# Patient Record
Sex: Female | Born: 1935 | Race: White | Hispanic: No | Marital: Married | Smoking: Never smoker
Health system: Southern US, Community
[De-identification: ages and names within clinical notes are randomized; demographics above are authoritative.]

## PROBLEM LIST (undated history)

## (undated) DIAGNOSIS — R05 Cough: Secondary | ICD-10-CM

## (undated) DIAGNOSIS — G510 Bell's palsy: Secondary | ICD-10-CM

## (undated) DIAGNOSIS — M1990 Unspecified osteoarthritis, unspecified site: Secondary | ICD-10-CM

## (undated) DIAGNOSIS — E039 Hypothyroidism, unspecified: Secondary | ICD-10-CM

## (undated) DIAGNOSIS — G629 Polyneuropathy, unspecified: Secondary | ICD-10-CM

## (undated) DIAGNOSIS — J8410 Pulmonary fibrosis, unspecified: Secondary | ICD-10-CM

## (undated) DIAGNOSIS — R0902 Hypoxemia: Secondary | ICD-10-CM

## (undated) DIAGNOSIS — R059 Cough, unspecified: Secondary | ICD-10-CM

## (undated) DIAGNOSIS — K589 Irritable bowel syndrome without diarrhea: Secondary | ICD-10-CM

## (undated) DIAGNOSIS — IMO0001 Reserved for inherently not codable concepts without codable children: Secondary | ICD-10-CM

## (undated) DIAGNOSIS — K219 Gastro-esophageal reflux disease without esophagitis: Secondary | ICD-10-CM

## (undated) HISTORY — PX: EYE SURGERY: SHX253

## (undated) HISTORY — PX: SHOULDER ARTHROSCOPY W/ ROTATOR CUFF REPAIR: SHX2400

## (undated) HISTORY — PX: OTHER SURGICAL HISTORY: SHX169

## (undated) HISTORY — PX: APPENDECTOMY: SHX54

---

## 2004-11-18 ENCOUNTER — Ambulatory Visit: Payer: Self-pay | Admitting: Internal Medicine

## 2004-12-04 ENCOUNTER — Ambulatory Visit: Payer: Self-pay | Admitting: Internal Medicine

## 2005-01-24 ENCOUNTER — Ambulatory Visit: Payer: Self-pay | Admitting: Internal Medicine

## 2006-01-15 ENCOUNTER — Ambulatory Visit: Payer: Self-pay | Admitting: Internal Medicine

## 2006-01-29 ENCOUNTER — Ambulatory Visit: Payer: Self-pay | Admitting: Unknown Physician Specialty

## 2006-09-25 ENCOUNTER — Ambulatory Visit: Payer: Self-pay | Admitting: Internal Medicine

## 2007-02-04 ENCOUNTER — Ambulatory Visit: Payer: Self-pay | Admitting: Internal Medicine

## 2007-02-25 ENCOUNTER — Ambulatory Visit: Payer: Self-pay | Admitting: Internal Medicine

## 2008-02-29 ENCOUNTER — Ambulatory Visit: Payer: Self-pay | Admitting: Internal Medicine

## 2008-03-03 ENCOUNTER — Ambulatory Visit: Payer: Self-pay | Admitting: Otolaryngology

## 2009-01-07 ENCOUNTER — Emergency Department: Payer: Self-pay | Admitting: Unknown Physician Specialty

## 2009-02-21 ENCOUNTER — Ambulatory Visit: Payer: Self-pay | Admitting: Internal Medicine

## 2009-03-13 ENCOUNTER — Ambulatory Visit: Payer: Self-pay | Admitting: Internal Medicine

## 2009-06-27 ENCOUNTER — Ambulatory Visit: Payer: Self-pay | Admitting: Ophthalmology

## 2009-07-10 ENCOUNTER — Ambulatory Visit: Payer: Self-pay | Admitting: Ophthalmology

## 2009-09-05 ENCOUNTER — Ambulatory Visit: Payer: Self-pay | Admitting: Gastroenterology

## 2010-02-07 ENCOUNTER — Ambulatory Visit: Payer: Self-pay | Admitting: Gastroenterology

## 2010-02-11 LAB — PATHOLOGY REPORT

## 2010-02-25 ENCOUNTER — Ambulatory Visit: Payer: Self-pay | Admitting: Internal Medicine

## 2010-03-21 ENCOUNTER — Ambulatory Visit: Payer: Self-pay | Admitting: Internal Medicine

## 2010-04-03 ENCOUNTER — Ambulatory Visit: Payer: Self-pay | Admitting: Internal Medicine

## 2011-03-04 ENCOUNTER — Ambulatory Visit: Payer: Self-pay | Admitting: Gastroenterology

## 2011-03-24 ENCOUNTER — Ambulatory Visit: Payer: Self-pay | Admitting: Internal Medicine

## 2011-12-09 ENCOUNTER — Ambulatory Visit: Payer: Self-pay | Admitting: Internal Medicine

## 2011-12-09 LAB — CBC WITH DIFFERENTIAL/PLATELET
Basophil %: 0.7 %
Eosinophil #: 0.1 10*3/uL (ref 0.0–0.7)
Eosinophil %: 1.6 %
HCT: 40 % (ref 35.0–47.0)
HGB: 12.9 g/dL (ref 12.0–16.0)
Lymphocyte #: 2.1 10*3/uL (ref 1.0–3.6)
Lymphocyte %: 33.1 %
MCV: 99 fL (ref 80–100)
Monocyte %: 9.3 %
Neutrophil #: 3.5 10*3/uL (ref 1.4–6.5)
Neutrophil %: 55.3 %
RBC: 4.04 10*6/uL (ref 3.80–5.20)
RDW: 13.6 % (ref 11.5–14.5)

## 2011-12-09 LAB — IRON AND TIBC
Iron Bind.Cap.(Total): 226 ug/dL — ABNORMAL LOW (ref 250–450)
Iron Saturation: 44 %
Iron: 98 ug/dL (ref 50–170)
Unbound Iron-Bind.Cap.: 126 ug/dL

## 2011-12-09 LAB — COMPREHENSIVE METABOLIC PANEL
Albumin: 3.5 g/dL (ref 3.4–5.0)
BUN: 16 mg/dL (ref 7–18)
Bilirubin,Total: 0.3 mg/dL (ref 0.2–1.0)
Calcium, Total: 8.4 mg/dL — ABNORMAL LOW (ref 8.5–10.1)
Chloride: 105 mmol/L (ref 98–107)
Co2: 28 mmol/L (ref 21–32)
Creatinine: 0.56 mg/dL — ABNORMAL LOW (ref 0.60–1.30)
EGFR (African American): >60
EGFR (Non-African Amer.): >60
Osmolality: 278 (ref 275–301)
SGPT (ALT): 27 U/L
Sodium: 139 mmol/L (ref 136–145)
Total Protein: 7.3 g/dL (ref 6.4–8.2)

## 2011-12-09 LAB — TSH: Thyroid Stimulating Horm: 2.73 u[IU]/mL

## 2011-12-31 ENCOUNTER — Ambulatory Visit: Payer: Self-pay | Admitting: Internal Medicine

## 2012-03-25 ENCOUNTER — Ambulatory Visit: Payer: Self-pay | Admitting: Internal Medicine

## 2012-10-28 ENCOUNTER — Ambulatory Visit: Payer: Self-pay | Admitting: Gastroenterology

## 2012-10-29 LAB — PATHOLOGY REPORT

## 2013-02-07 ENCOUNTER — Ambulatory Visit: Payer: Self-pay | Admitting: Gastroenterology

## 2013-02-08 LAB — PATHOLOGY REPORT

## 2013-03-27 IMAGING — CT CT CHEST W/ CM
1 series · 15 of 31 positions shown, 19 images · non-contrast
Comparison: none

REASON FOR EXAM: interstitial lung disease
COMMENTS:

[Series 2: chest w/ 3.0 i41f 3 · axial · 0.60mm/px · z∈[-622,-364]mm · 15 of 94 slices shown, 19 images]
[im 4/94  mediastinal]
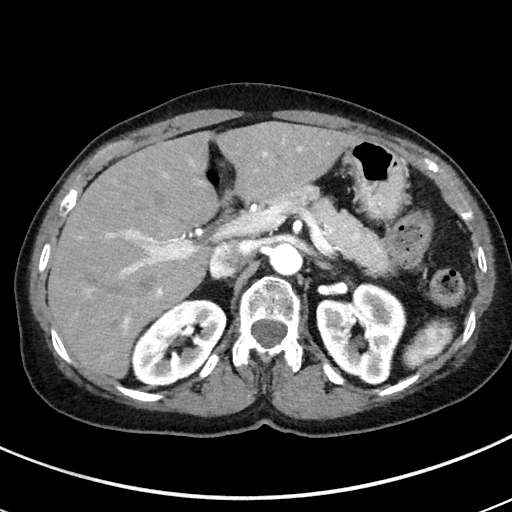
[im 4/94  lung]
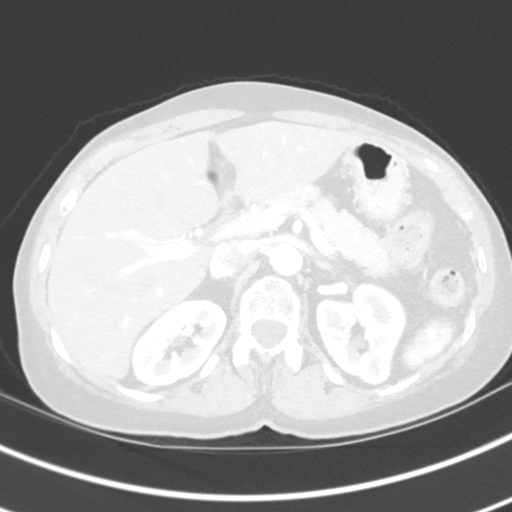
[im 11/94  lung]
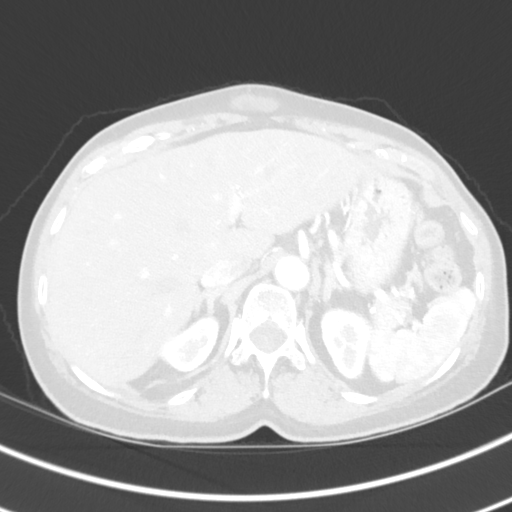
[im 18/94  lung]
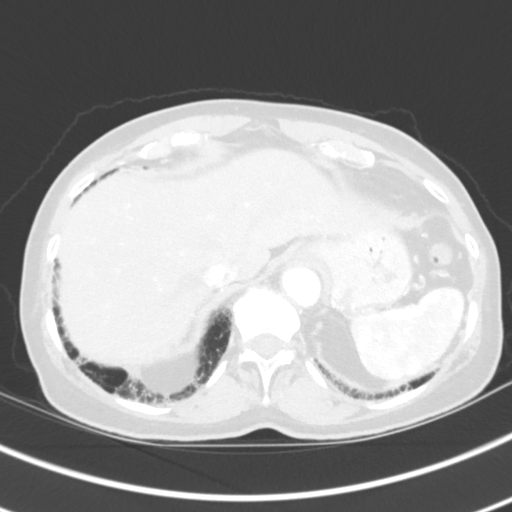
[im 21/94  lung]
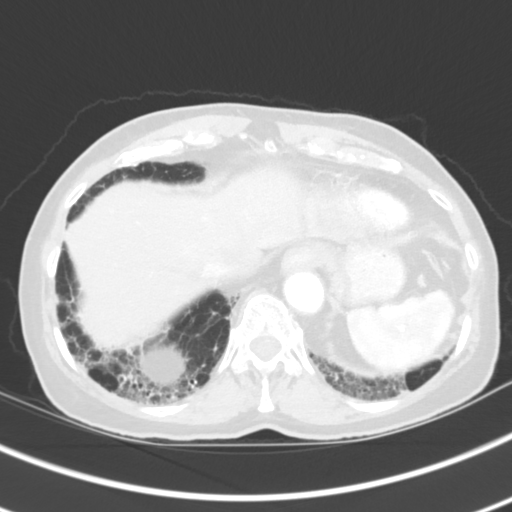
[im 28/94  mediastinal]
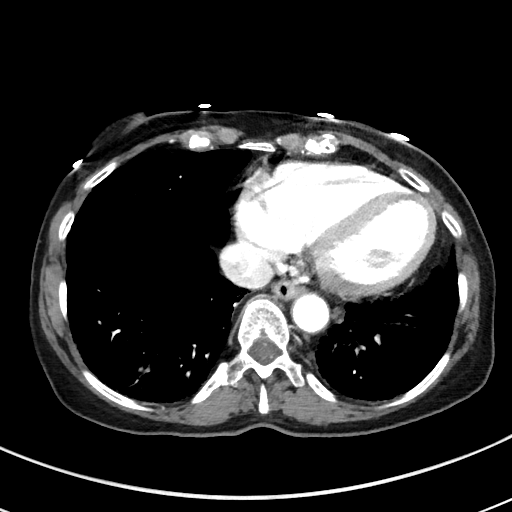
[im 28/94  lung]
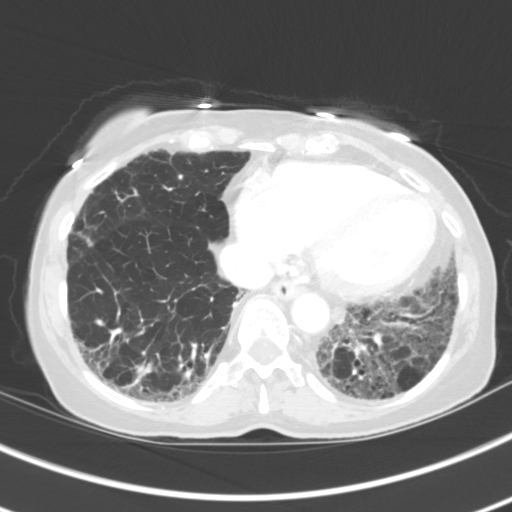
[im 35/94  lung]
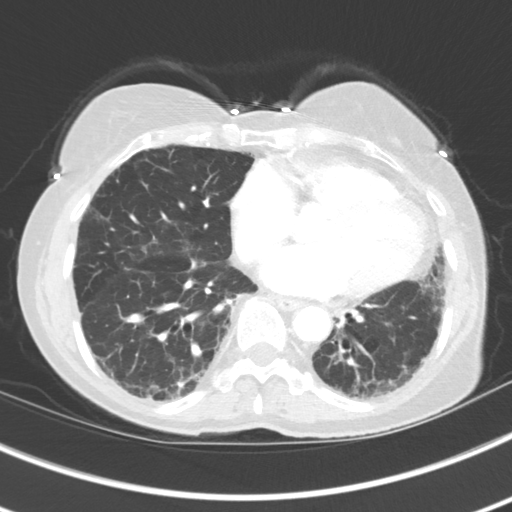
[im 42/94  lung]
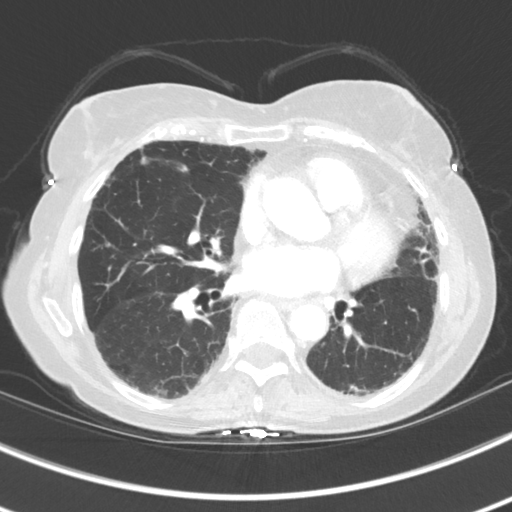
[im 49/94  lung]
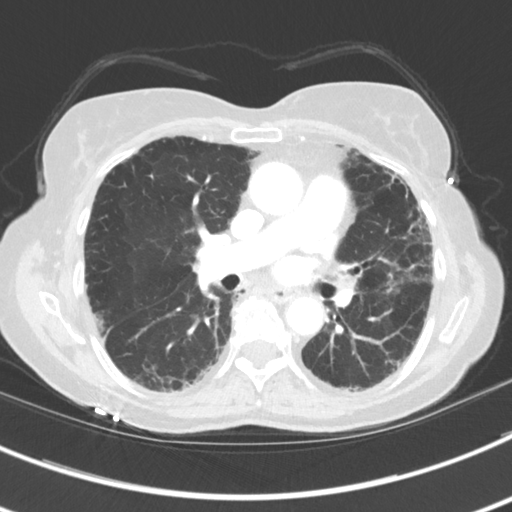
[im 52/94  mediastinal]
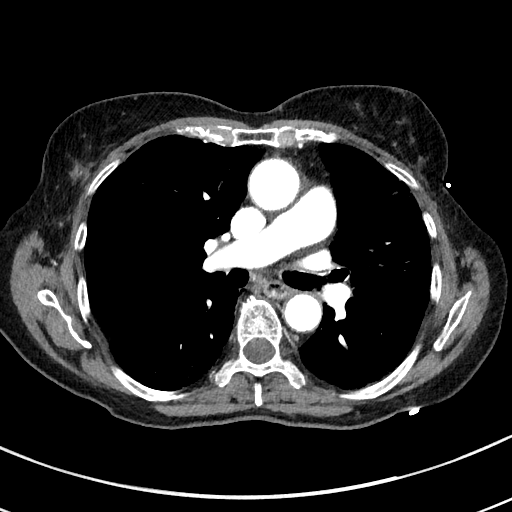
[im 52/94  lung]
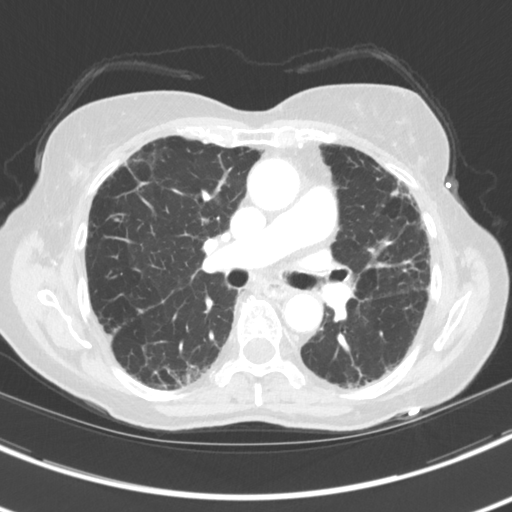
[im 59/94  lung]
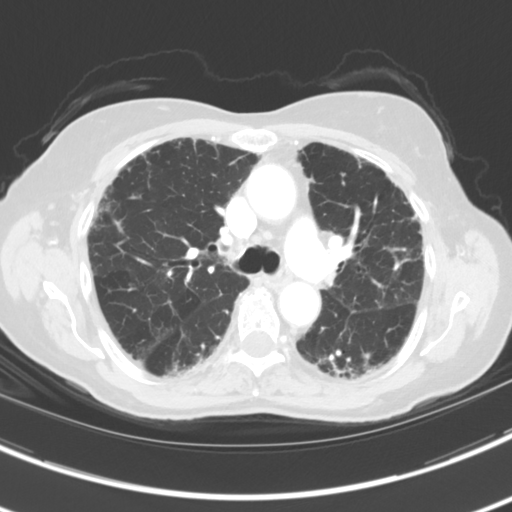
[im 66/94  lung]
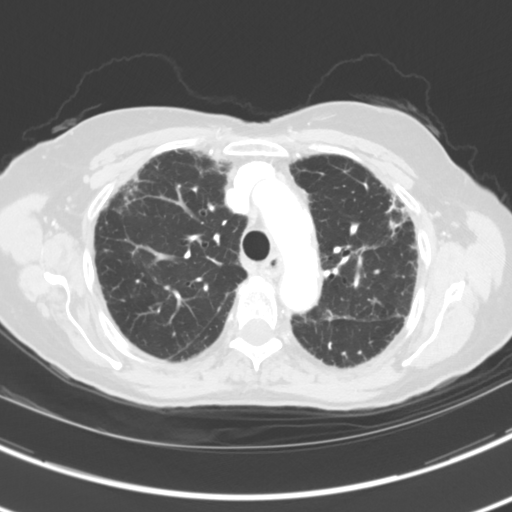
[im 73/94  lung]
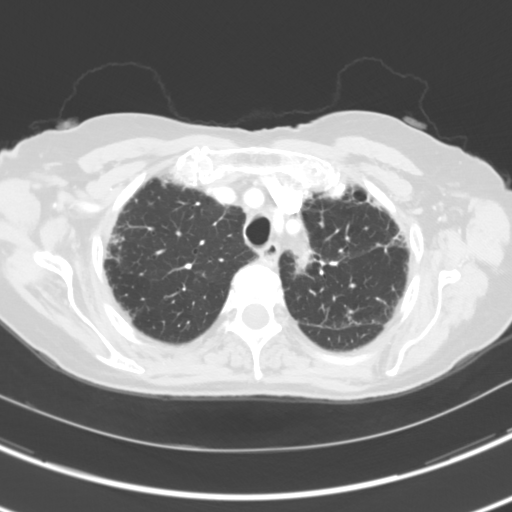
[im 76/94  mediastinal]
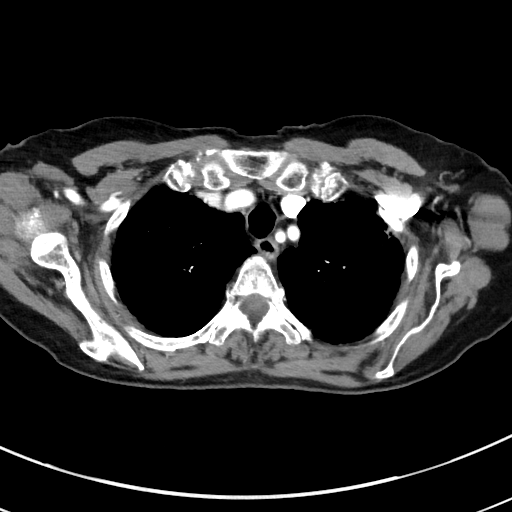
[im 76/94  lung]
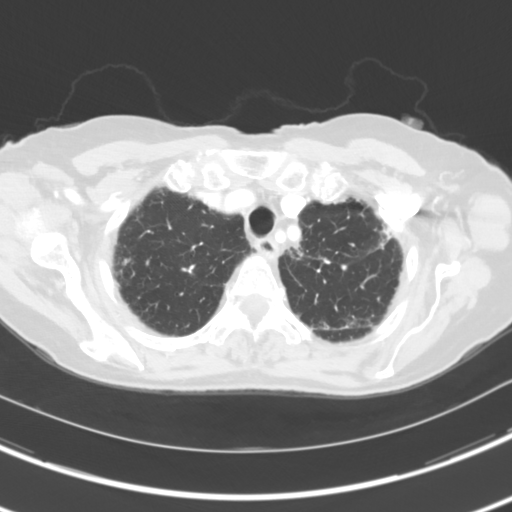
[im 83/94  lung]
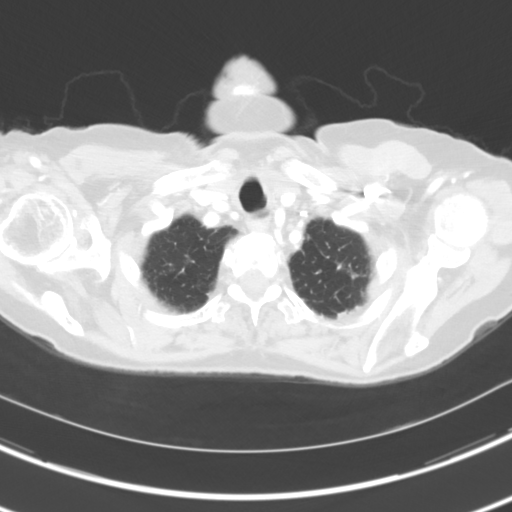
[im 90/94  lung]
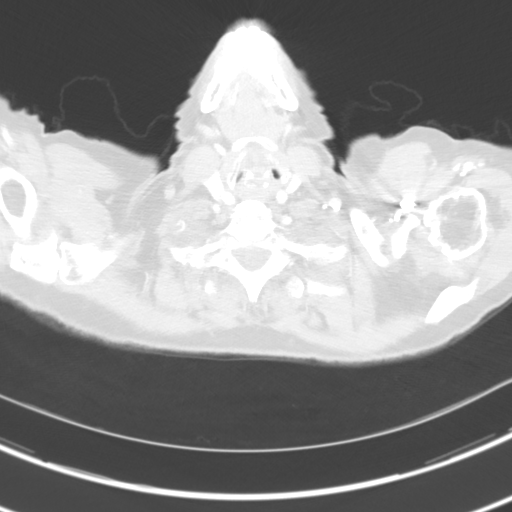

[15 of 31 positions shown; findings below may reference images not displayed]

PROCEDURE:     KCT - KCT CHEST WITH CONTRAST  - December 31, 2011 [DATE]

RESULT:     Axial CT scanning was performed through the chest with
reconstructions at 3 mm intervals and slice thicknesses following
intravenous administration of 75 cc of Isovue 370. Review of multiplanar
reconstructed images was performed separately on the VIA monitor.

There are fairly extensive fibrotic changes in both lungs. These have
increased in conspicuity since February 2010. Alveolar infiltrates are not
demonstrated. No suspicious parenchymal masses are demonstrated. There is
bibasilar bronchiectasis.

At mediastinal window settings the cardiac chambers are top normal in size.
The caliber of the thoracic aorta is normal. The thoracic esophagus does not
appear abnormally distended. Contrast within the pulmonary arterial tree is
normal where visualized. No bulky mediastinal or hilar lymph nodes are
evident. The thoracic vertebral bodies are preserved in height. The thyroid
lobes are not abnormally enlarged.

Within the upper abdomen the observed portions of the liver mean and adrenal
glands are normal in appearance.
IMPRESSION: 1. There are progressive interstitial fibrotic changes in both lungs. There
is bibasilar bronchiectasis. No suspicious mass or classic alveolar
pneumonia is seen.
2. There is no mediastinal or hilar lymphadenopathy. There is no evidence of
CHF. No pleural nor pericardial effusion is demonstrated.

## 2013-04-12 ENCOUNTER — Ambulatory Visit: Payer: Self-pay | Admitting: Physician Assistant

## 2013-04-26 ENCOUNTER — Ambulatory Visit: Payer: Self-pay | Admitting: Physician Assistant

## 2013-06-06 ENCOUNTER — Ambulatory Visit: Payer: Self-pay

## 2013-11-09 ENCOUNTER — Ambulatory Visit: Payer: Self-pay | Admitting: Physician Assistant

## 2014-04-04 ENCOUNTER — Ambulatory Visit (INDEPENDENT_AMBULATORY_CARE_PROVIDER_SITE_OTHER): Payer: Medicare Other

## 2014-04-04 ENCOUNTER — Encounter: Payer: Self-pay | Admitting: Podiatry

## 2014-04-04 ENCOUNTER — Ambulatory Visit (INDEPENDENT_AMBULATORY_CARE_PROVIDER_SITE_OTHER): Payer: Medicare Other | Admitting: Podiatry

## 2014-04-04 VITALS — BP 143/73 | HR 63 | Resp 16 | Ht 59.0 in | Wt 113.0 lb

## 2014-04-04 DIAGNOSIS — M2041 Other hammer toe(s) (acquired), right foot: Secondary | ICD-10-CM

## 2014-04-04 DIAGNOSIS — M2010 Hallux valgus (acquired), unspecified foot: Secondary | ICD-10-CM

## 2014-04-04 NOTE — Patient Instructions (Signed)
Bunion You have a bunion deformity of the feet. This is more common in women. It tends to be an inherited problem. Symptoms can include pain, swelling, and deformity around the great toe. Numbness and tingling may also be present. Your symptoms are often worsened by wearing shoes that cause pressure on the bunion. Changing the type of shoes you wear helps reduce symptoms. A wide shoe decreases pressure on the bunion. An arch support may be used if you have flat feet. Avoid shoes with heels higher than two inches. This puts more pressure on the bunion. X-rays may be helpful in evaluating the severity of the problem. Other foot problems often seen with bunions include corns, calluses, and hammer toes. If the deformity or pain is severe, surgical treatment may be necessary. Keep off your painful foot as much as possible until the pain is relieved. Call your caregiver if your symptoms are worse.  SEEK IMMEDIATE MEDICAL CARE IF:  You have increased redness, pain, swelling, or other symptoms of infection. Document Released: 05/05/2005 Document Revised: 07/28/2011 Document Reviewed: 11/02/2006 [facility] Patient Information 2015 [place], [place]. This information is not intended to replace advice given to you by your health care provider. Make sure you discuss any questions you have with your health care provider. Hammer Toes Hammer toes is a condition in which one or more of your toes is permanently flexed. CAUSES  This happens when a muscle imbalance or abnormal bone length makes your small toes buckle. This causes the toe joint to contract and the strong cord-like bands that attach muscles to the bones (tendons) in your toes to shorten.  SIGNS AND SYMPTOMS  Common symptoms of flexible hammer toes include:   A buildup of skin cells (corns). Corns occur where boney bumps come in frequent contact with hard surfaces. For example, where your shoes press and rub.  Irritation.  Inflammation.  Pain.  Limited  motion in your toes. DIAGNOSIS  Hammer toes are diagnosed through a physical exam of your toes. During the exam, your health care provider may try to reproduce your symptoms by manipulating your foot. Often, X-ray exams are done to determine the degree of deformity and to make sure that the cause is not a fracture.  TREATMENT  Hammer toes can be treated with corrective surgery. There are several types of surgical procedures that can treat hammer toes. The most common procedures include:  Arthroplasty--A portion of the joint is surgically removed and your toe is straightened. The gap fills in with fibrous tissue. This procedure helps treat pain and deformity and helps restore function.  Fusion--Cartilage between the two bones of the affected joint is taken out and the bones fuse together into one longer bone. This helps keep your toe stable and reduces pain but leaves your toe stiff, yet straight.  Implantation--A portion of your bone is removed and replaced with an implant to restore motion.  Flexor tendon transfers--This procedure repositions the tendons that curl the toes down (flexor tendons). This may be done to release the deforming force that causes your toe to buckle. Several of these procedures require fixing your toe with a pin that is visible at the tip of your toe. The pin keeps the toe straight during healing. Your health care provider will remove the pin usually within 4-8 weeks after the procedure.  Document Released: 05/02/2000 Document Revised: 05/10/2013 Document Reviewed: 01/10/2013 [facility] Patient Information 2015 [place], [place]. This information is not intended to replace advice given to you by your health care  provider. Make sure you discuss any questions you have with your health care provider.

## 2014-04-05 NOTE — Progress Notes (Signed)
Patient ID: Sherri KluverMargot S Price, female   DOB: Oct 24, 1933, 80 y.o.   MRN: [id number]  Subjective: 80 year old female presents the office today for complaints of a painful lesion on the bottom of her right foot. She states that she puts a lot of pressure in this area and she has a callus which is painful particularly with weightbearing and ambulation insert and shoe gear. She previously has seen Dr. Al CorpusHyatt discussed various surgical options for which she is considering. She denies any acute changes since last appointment. No other complaints at this time. Denies any systemic complaints such as fevers, chills, nausea, vomiting.  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 There is a prominent second metatarsal head plantarly with hyperkeratotic tissue in this area plantarly. There is tenderness to palpation overlying the second MTPJ, particularly over the area of hyperkeratotic lesion plantarly. Mild HAV and hammertoe contractures and left foot however asymptomatic at this time. MMT 5/5, ROM WNL No calf pain with compression, swelling, warmth, erythema.   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  Plan: -X-rays were obtained and reviewed with the patient today -Conservative versus surgical options discussed including alternatives, risks, complications. -Hyperkeratotic lesion submetatarsal 2 on the right foot sharply debrided without complications. Dispensed offloading pads. -Patient was inquiring about various surgical options. Discussed with her options including possible [place]  bunionectomy with second metatarsal osteotomy and hammertoe repair versus possible second digit amputation due to the dislocation/contracture of the toe. At this time the patient will consider her options. -Follow-up as needed. In the meantime, call the office with any questions, concerns, change in symptoms.

## 2014-04-18 ENCOUNTER — Ambulatory Visit: Payer: Self-pay | Admitting: Physician Assistant

## 2014-04-24 ENCOUNTER — Ambulatory Visit: Payer: Self-pay | Admitting: Podiatry

## 2014-08-29 ENCOUNTER — Ambulatory Visit
Admit: 2014-08-29 | Disposition: A | Discharge status: Home / Self Care | Payer: Self-pay | Attending: Internal Medicine | Admitting: Internal Medicine

## 2014-09-21 ENCOUNTER — Encounter: Payer: Self-pay | Admitting: *Deleted

## 2014-09-22 ENCOUNTER — Encounter: Payer: Self-pay | Admitting: Anesthesiology

## 2014-09-22 ENCOUNTER — Ambulatory Visit: Payer: Medicare Other | Admitting: Anesthesiology

## 2014-09-22 ENCOUNTER — Ambulatory Visit
Admission: RE | Admit: 2014-09-22 | Discharge: 2014-09-22 | Disposition: A | Discharge status: Home / Self Care | Payer: Medicare Other | Source: Ambulatory Visit | Attending: Gastroenterology | Admitting: Gastroenterology

## 2014-09-22 ENCOUNTER — Encounter
Admission: RE | Disposition: A | Discharge status: Home / Self Care | Payer: Self-pay | Source: Ambulatory Visit | Attending: Gastroenterology

## 2014-09-22 DIAGNOSIS — Z79899 Other long term (current) drug therapy: Secondary | ICD-10-CM | POA: Diagnosis not present

## 2014-09-22 DIAGNOSIS — R0789 Other chest pain: Secondary | ICD-10-CM | POA: Diagnosis present

## 2014-09-22 DIAGNOSIS — K210 Gastro-esophageal reflux disease with esophagitis: Secondary | ICD-10-CM | POA: Diagnosis not present

## 2014-09-22 DIAGNOSIS — K219 Gastro-esophageal reflux disease without esophagitis: Secondary | ICD-10-CM

## 2014-09-22 HISTORY — DX: Gastro-esophageal reflux disease without esophagitis: K21.9

## 2014-09-22 HISTORY — PX: ESOPHAGOGASTRODUODENOSCOPY: SHX5428

## 2014-09-22 SURGERY — EGD (ESOPHAGOGASTRODUODENOSCOPY)
Anesthesia: Monitor Anesthesia Care

## 2014-09-22 MED ORDER — SODIUM CHLORIDE 0.9 % IV SOLN: INTRAVENOUS | Status: DC

## 2014-09-22 MED ORDER — SODIUM CHLORIDE 0.9 % IV SOLN
INTRAVENOUS | Status: DC
  Administered 2014-09-22: 09:00:00 via INTRAVENOUS
  Administered 2014-09-22: 1000 mL via INTRAVENOUS

## 2014-09-22 MED ORDER — PROPOFOL INFUSION 10 MG/ML OPTIME
INTRAVENOUS | Status: DC | PRN
  Administered 2014-09-22: 150 ug/kg/min via INTRAVENOUS

## 2014-09-22 MED ORDER — LIDOCAINE HCL (CARDIAC) 20 MG/ML IV SOLN
INTRAVENOUS | Status: DC | PRN
  Administered 2014-09-22: 50 mg via INTRAVENOUS

## 2014-09-22 MED ORDER — GLYCOPYRROLATE 0.2 MG/ML IJ SOLN
INTRAMUSCULAR | Status: DC | PRN
  Administered 2014-09-22: 0.2 mg via INTRAVENOUS

## 2014-09-22 MED ORDER — PROPOFOL 10 MG/ML IV BOLUS
INTRAVENOUS | Status: DC | PRN
  Administered 2014-09-22: 75 ug via INTRAVENOUS

## 2014-09-22 NOTE — Transfer of Care (Signed)
Immediate Anesthesia Transfer of Care Note  Patient: Sherri Price  Procedure(s) Performed: Procedure(s): ESOPHAGOGASTRODUODENOSCOPY (EGD) (N/A)  Patient Location: PACU/Endo  Anesthesia Type:MAC  Level of Consciousness: Alert, Awake, Oriented  Airway & Oxygen Therapy: Patient Spontanous Breathing  Post-op Assessment: Report given to RN  Post vital signs: Reviewed and stable  Last Vitals:  Filed Vitals:   09/22/14 0909  BP:   Pulse:   Temp: 35.6 C  Resp: 16    Complications: No apparent anesthesia complications

## 2014-09-22 NOTE — H&P (Signed)
Date of Initial H&P: 09/05/2014 History reviewed, patient examined, no change in status, stable for surgery. 

## 2014-09-22 NOTE — Op Note (Signed)
[facility] Patient Name: Sherri FeilMargot Kimpel Procedure Date: 09/22/2014 8:54 AM MRN: [id number] Account #: [id number] Date of Birth: September 10, 1933 Admit Type: Outpatient Age: [age] [facility] Gender: Female Note Status: Finalized Procedure:         Upper GI endoscopy Indications:       Suspected esophageal reflux, Chest pain (non cardiac) Providers:         Ezzard StandingPaul Y. Bluford Kaufmannh, MD Referring MD:      Sallye LatNo Local Md, MD (Referring MD) Medicines:         Monitored Anesthesia Care Complications:     No immediate complications. Procedure:         Pre-Anesthesia Assessment:                    - Prior to the procedure, a History and Physical was                     performed, and patient medications, allergies and                     sensitivities were reviewed. The patient's tolerance of                     previous anesthesia was reviewed.                    - The risks and benefits of the procedure and the sedation                     options and risks were discussed with the patient. All                     questions were answered and informed consent was obtained.                    - After reviewing the risks and benefits, the patient was                     deemed in satisfactory condition to undergo the procedure.                    After obtaining informed consent, the endoscope was passed                     under direct vision. Throughout the procedure, the                     patient's blood pressure, pulse, and oxygen saturations                     were monitored continuously. The Olympus GIF-160 endoscope                     (S#. [id number]) was introduced through the mouth, and                     advanced to the second part of duodenum. The upper GI                     endoscopy was accomplished without difficulty. The patient                     tolerated the procedure well. Findings:      The examined esophagus was normal. Biopsies  were taken with a  cold       forceps for histology.      The entire examined stomach was normal.      The examined duodenum was normal. Impression:        - Normal esophagus. Biopsied.                    - Normal stomach.                    - Normal examined duodenum. Recommendation:    - Discharge patient to home.                    - Observe patient's clinical course.                    - Continue present medications.                    - Await pathology results.                    - The findings and recommendations were discussed with the                     patient. Procedure Code(s): --- Professional ---                    [id number], Esophagogastroduodenoscopy, flexible, transoral;                     with biopsy, single or multiple Diagnosis Code(s): --- Professional ---                    R07.89, Other chest pain CPT copyright 2014 American Medical Association. All rights reserved. The codes documented in this report are preliminary and upon coder review may  be revised to meet current compliance requirements. Wallace CullensPaul Y Abagail Limb, MD 09/22/2014 9:06:48 AM This report has been signed electronically. Number of Addenda: 0 Note Initiated On: 09/22/2014 8:54 AM      [facility]

## 2014-09-22 NOTE — Anesthesia Preprocedure Evaluation (Addendum)
Anesthesia Evaluation  Patient identified by MRN, date of birth, ID band Patient awake    Airway Mallampati: II  TM Distance: >3 FB Neck ROM: Full    Dental  (+) Chipped   Pulmonary          Cardiovascular     Neuro/Psych    GI/Hepatic GERD-  ,  Endo/Other  Hypothyroidism   Renal/GU      Musculoskeletal   Abdominal   Peds  Hematology   Anesthesia Other Findings   Reproductive/Obstetrics                            Anesthesia Physical Anesthesia Plan  ASA: II  Anesthesia Plan: MAC   Post-op Pain Management:    Induction: Intravenous  Airway Management Planned: Nasal Cannula  Additional Equipment:   Intra-op Plan:   Post-operative Plan:   Informed Consent: I have reviewed the patients History and Physical, chart, labs and discussed the procedure including the risks, benefits and alternatives for the proposed anesthesia with the patient or authorized representative who has indicated his/her understanding and acceptance.     Plan Discussed with: CRNA and Surgeon  Anesthesia Plan Comments:         Anesthesia Quick Evaluation

## 2014-09-22 NOTE — Anesthesia Postprocedure Evaluation (Signed)
Immediate Anesthesia Transfer of Care Note  Patient: [name]  Procedure(s) Performed: Procedure(s): ESOPHAGOGASTRODUODENOSCOPY (EGD) (N/A)  Patient Location: PACU  Anesthesia Type:General  Level of Consciousness: Alert, Awake, Oriented  Airway & Oxygen Therapy: Patient Spontanous Breathing  Post-op Assessment: Report given to RN  Post vital signs: Reviewed and stable  Last Vitals: 82HR 134/76 BP 98% spo2 20 resp   Complications: No apparent anesthesia complications

## 2014-09-27 NOTE — Addendum Note (Signed)
Addendum  created 09/27/14 1437 by Sherron Flemingsustin Laquon Emel, CRNA   Modules edited: Notes Section   Notes Section:  File: [id number]

## 2014-09-27 NOTE — Anesthesia Postprocedure Evaluation (Signed)
  Anesthesia Post-op Note  Patient: Priscille KluverMargot S Yearby  Procedure(s) Performed: Procedure(s): ESOPHAGOGASTRODUODENOSCOPY (EGD) (N/A)  Anesthesia type:MAC  Patient location: PACU/endo  Post pain: Pain level controlled  Post assessment: Post-op Vital signs reviewed, Patient's Cardiovascular Status Stable, Respiratory Function Stable, Patent Airway and No signs of Nausea or vomiting  Post vital signs: Reviewed and stable  Last Vitals:  Filed Vitals:   09/22/14 0940  BP: 125/60  Pulse: 71  Temp:   Resp: 26    Level of consciousness: awake, alert  and patient cooperative  Complications: No apparent anesthesia complications

## 2014-10-10 LAB — SURGICAL PATHOLOGY

## 2014-11-16 ENCOUNTER — Ambulatory Visit (INDEPENDENT_AMBULATORY_CARE_PROVIDER_SITE_OTHER): Payer: Medicare Other | Admitting: Podiatry

## 2014-11-16 ENCOUNTER — Ambulatory Visit (INDEPENDENT_AMBULATORY_CARE_PROVIDER_SITE_OTHER): Payer: Medicare Other

## 2014-11-16 VITALS — BP 125/75 | HR 88 | Resp 16

## 2014-11-16 DIAGNOSIS — M779 Enthesopathy, unspecified: Secondary | ICD-10-CM

## 2014-11-16 DIAGNOSIS — M2011 Hallux valgus (acquired), right foot: Secondary | ICD-10-CM

## 2014-11-16 DIAGNOSIS — M10071 Idiopathic gout, right ankle and foot: Secondary | ICD-10-CM

## 2014-11-16 DIAGNOSIS — M109 Gout, unspecified: Secondary | ICD-10-CM

## 2014-11-16 NOTE — Progress Notes (Signed)
Patient ID: Sherri Price, female   DOB: July 05, 1933, 81 y.o.   MRN: [id number]  Subjective: 81 year old female presents the office today for complaints of an inflamed bunion to her right foot. She said that yesterday she certainly knows increased swelling and redness overlying the big toe joint. She has pain which on the been the big toe as well. She denies any history of injury or trauma to the area and she denies any change or increase activity when the symptoms started. She denies any open sores. Denies any systemic complaints as fevers, chills, nausea, vomiting. Denies any calf pain, chest pain, shortness of breath. Denies any history of gout. No other complaints at this time.  Objective: AAO 3, NAD DP/PT pulses palpable, CRT less than 3 seconds  Protective sensation intact with Simms Weinstein monofilament, vibratory sensation intact, Achilles tendon reflex intact. There is a moderate to severe structural HAV deformity present on the right foot with an overlapping second digit with underlying hammertoe contracture. There is edema, erythema overlying the right first MTPJ with tenderness palpation overlying the area. There is pain with first MTPJ range of motion. The erythema appears to be localized to the first MTPJ. There is no other areas of tenderness to bilateral lower extra Ms. No other areas of edema, erythema, increase in warmth. No open lesions or pre-ulcer lesions identified bilaterally. There is no pain with calf compression, swelling, warmth, erythema.  Assessment: 81 year old female with right first MTPJ pain, swelling, erythema likely inflamed bursitis versus gout.  Plan: -Treatment options discussed including all alternatives, risks, and complications -Discussed likely etiology of her symptoms. -Will obtain CBC, ESR, CRP, uric acid. -Discussed steroid injection including all risks and complications. She wishes to proceed in early concerns. Under sterile conditions a total of of  1.5 mL of a mixture of Kenalog 10, 0.5% Marcaine plain and 2% lidocaine plain was infiltrated into and around the first MTPJ. Bandage was applied. Patient tolerated injection well any complications. Post injection care was discussed with the patient. -Follow-up in 2 weeks or sooner if any problems are to arise. In the meantime I encouraged her to call the office with any questions, concerns, change in symptoms.  Celesta Gentile, DPM

## 2014-11-17 LAB — CBC WITH DIFFERENTIAL/PLATELET
BASOS ABS: 0.1 10*3/uL (ref 0.0–0.2)
BASOS: 1 %
EOS (ABSOLUTE): 0.1 10*3/uL (ref 0.0–0.4)
Eos: 2 %
Hematocrit: 40.3 % (ref 34.0–46.6)
Hemoglobin: 13.6 g/dL (ref 11.1–15.9)
IMMATURE GRANS (ABS): 0 10*3/uL (ref 0.0–0.1)
Immature Granulocytes: 0 %
LYMPHS ABS: 2.8 10*3/uL (ref 0.7–3.1)
Lymphs: 31 %
MCH: 33.4 pg — ABNORMAL HIGH (ref 26.6–33.0)
MCHC: 33.7 g/dL (ref 31.5–35.7)
MCV: 99 fL — ABNORMAL HIGH (ref 79–97)
MONOCYTES: 7 %
MONOS ABS: 0.6 10*3/uL (ref 0.1–0.9)
Neutrophils Absolute: 5.4 10*3/uL (ref 1.4–7.0)
Neutrophils: 59 %
PLATELETS: 240 10*3/uL (ref 150–379)
RBC: 4.07 x10E6/uL (ref 3.77–5.28)
RDW: 13.6 % (ref 12.3–15.4)
WBC: 9 10*3/uL (ref 3.4–10.8)

## 2014-11-17 LAB — C-REACTIVE PROTEIN: CRP: 1.1 mg/L (ref 0.0–4.9)

## 2014-11-17 LAB — URIC ACID: URIC ACID: 3.5 mg/dL (ref 2.5–7.1)

## 2014-11-17 LAB — SEDIMENTATION RATE: Sed Rate: 4 mm/hr (ref 0–40)

## 2014-11-30 ENCOUNTER — Ambulatory Visit: Payer: Medicare Other | Admitting: Podiatry

## 2014-12-03 ENCOUNTER — Encounter: Payer: Self-pay | Admitting: Emergency Medicine

## 2014-12-03 ENCOUNTER — Emergency Department: Payer: Medicare Other

## 2014-12-03 ENCOUNTER — Emergency Department
Admission: EM | Admit: 2014-12-03 | Discharge: 2014-12-03 | Disposition: A | Discharge status: Home / Self Care | Payer: Medicare Other | Attending: Emergency Medicine | Admitting: Emergency Medicine

## 2014-12-03 DIAGNOSIS — R079 Chest pain, unspecified: Secondary | ICD-10-CM | POA: Diagnosis present

## 2014-12-03 DIAGNOSIS — Z880 Allergy status to penicillin: Secondary | ICD-10-CM | POA: Diagnosis not present

## 2014-12-03 DIAGNOSIS — Z79899 Other long term (current) drug therapy: Secondary | ICD-10-CM | POA: Insufficient documentation

## 2014-12-03 LAB — BASIC METABOLIC PANEL
Anion gap: 7 (ref 5–15)
BUN: 10 mg/dL (ref 6–20)
CO2: 26 mmol/L (ref 22–32)
Calcium: 8.5 mg/dL — ABNORMAL LOW (ref 8.9–10.3)
Chloride: 99 mmol/L — ABNORMAL LOW (ref 101–111)
Creatinine, Ser: 0.58 mg/dL (ref 0.44–1.00)
GFR calc non Af Amer: >60 mL/min (ref >60)
Glucose, Bld: 111 mg/dL — ABNORMAL HIGH (ref 65–99)
Potassium: 4.1 mmol/L (ref 3.5–5.1)
Sodium: 132 mmol/L — ABNORMAL LOW (ref 135–145)

## 2014-12-03 LAB — CBC
HCT: 39 % (ref 35.0–47.0)
HEMOGLOBIN: 13.2 g/dL (ref 12.0–16.0)
MCH: 32.9 pg (ref 26.0–34.0)
MCHC: 33.9 g/dL (ref 32.0–36.0)
MCV: 97.2 fL (ref 80.0–100.0)
Platelets: 221 10*3/uL (ref 150–440)
RBC: 4.02 MIL/uL (ref 3.80–5.20)
RDW: 13.6 % (ref 11.5–14.5)
WBC: 7.4 10*3/uL (ref 3.6–11.0)

## 2014-12-03 LAB — HEPATIC FUNCTION PANEL
ALT: 22 U/L (ref 14–54)
AST: 31 U/L (ref 15–41)
Albumin: 3.7 g/dL (ref 3.5–5.0)
Alkaline Phosphatase: 70 U/L (ref 38–126)
BILIRUBIN TOTAL: 0.4 mg/dL (ref 0.3–1.2)
Bilirubin, Direct: <0.1 mg/dL — ABNORMAL LOW (ref 0.1–0.5)
Total Protein: 6.6 g/dL (ref 6.5–8.1)

## 2014-12-03 LAB — TROPONIN I: Troponin I: <0.03 ng/mL (ref <0.031)

## 2014-12-03 NOTE — ED Notes (Signed)
Patient transported to X-ray 

## 2014-12-03 NOTE — ED Notes (Signed)
Pt reports chest pain the radiates through to back that started about 10:45 am.  Pain radiated up through jaw.  Pt states that pain is easing now.  No SOB.  No nausea. No hx of heart issues.  Had nuclear stress " a couple of months ago". Test was negative.

## 2014-12-03 NOTE — Discharge Instructions (Signed)
Please seek medical attention for any high fevers, chest pain, shortness of breath, change in behavior, persistent vomiting, bloody stool or any other new or concerning symptoms. ° °Chest Pain (Nonspecific) °It is often hard to give a specific diagnosis for the cause of chest pain. There is always a chance that your pain could be related to something serious, such as a heart attack or a blood clot in the lungs. You need to follow up with your health care provider for further evaluation. °CAUSES  °· Heartburn. °· Pneumonia or bronchitis. °· Anxiety or stress. °· Inflammation around your heart (pericarditis) or lung (pleuritis or pleurisy). °· A blood clot in the lung. °· A collapsed lung (pneumothorax). It can develop suddenly on its own (spontaneous pneumothorax) or from trauma to the chest. °· Shingles infection (herpes zoster virus). °The chest wall is composed of bones, muscles, and cartilage. Any of these can be the source of the pain. °· The bones can be bruised by injury. °· The muscles or cartilage can be strained by coughing or overwork. °· The cartilage can be affected by inflammation and become sore (costochondritis). °DIAGNOSIS  °Lab tests or other studies may be needed to find the cause of your pain. Your health care provider may have you take a test called an ambulatory electrocardiogram (ECG). An ECG records your heartbeat patterns over a 24-hour period. You may also have other tests, such as: °· Transthoracic echocardiogram (TTE). During echocardiography, sound waves are used to evaluate how blood flows through your heart. °· Transesophageal echocardiogram (TEE). °· Cardiac monitoring. This allows your health care provider to monitor your heart rate and rhythm in real time. °· Holter monitor. This is a portable device that records your heartbeat and can help diagnose heart arrhythmias. It allows your health care provider to track your heart activity for several days, if needed. °· Stress tests by  exercise or by giving medicine that makes the heart beat faster. °TREATMENT  °· Treatment depends on what may be causing your chest pain. Treatment may include: °¨ Acid blockers for heartburn. °¨ Anti-inflammatory medicine. °¨ Pain medicine for inflammatory conditions. °¨ Antibiotics if an infection is present. °· You may be advised to change lifestyle habits. This includes stopping smoking and avoiding alcohol, caffeine, and chocolate. °· You may be advised to keep your head raised (elevated) when sleeping. This reduces the chance of acid going backward from your stomach into your esophagus. °Most of the time, nonspecific chest pain will improve within 2-3 days with rest and mild pain medicine.  °HOME CARE INSTRUCTIONS  °· If antibiotics were prescribed, take them as directed. Finish them even if you start to feel better. °· For the next few days, avoid physical activities that bring on chest pain. Continue physical activities as directed. °· Do not use any tobacco products, including cigarettes, chewing tobacco, or electronic cigarettes. °· Avoid drinking alcohol. °· Only take medicine as directed by your health care provider. °· Follow your health care provider's suggestions for further testing if your chest pain does not go away. °· Keep any follow-up appointments you made. If you do not go to an appointment, you could develop lasting (chronic) problems with pain. If there is any problem keeping an appointment, call to reschedule. °SEEK MEDICAL CARE IF:  °· Your chest pain does not go away, even after treatment. °· You have a rash with blisters on your chest. °· You have a fever. °SEEK IMMEDIATE MEDICAL CARE IF:  °· You have increased chest   pain or pain that spreads to your arm, neck, jaw, back, or abdomen. °· You have shortness of breath. °· You have an increasing cough, or you cough up blood. °· You have severe back or abdominal pain. °· You feel nauseous or vomit. °· You have severe weakness. °· You  faint. °· You have chills. °This is an emergency. Do not wait to see if the pain will go away. Get medical help at once. Call your local emergency services (911 in [place] Do not drive yourself to the hospital. °MAKE SURE YOU:  °· Understand these instructions. °· Will watch your condition. °· Will get help right away if you are not doing well or get worse. °Document Released: 02/12/2005 Document Revised: 05/10/2013 Document Reviewed: 12/09/2007 °ExitCare® Patient Information ©2015 [employer], [employer]. This information is not intended to replace advice given to you by your health care provider. Make sure you discuss any questions you have with your health care provider. ° °

## 2014-12-03 NOTE — ED Provider Notes (Signed)
[facility] Emergency Department Provider Note    ____________________________________________  Time seen: 1225  I have reviewed the triage vital signs and the nursing notes.   HISTORY  Chief Complaint Chest Pain   History limited by: Not Limited   HPI Sherri Price is a 81 y.o. female who presents to the emergency department today after an episode of chest pain. Patient states it started roughly 1-1/2 hours ago. It was a pressure type pain that was located between her shoulder blades that radiated into her chest. No radiation to the neck or shoulder. Patient did not have any associated shortness of breath or diaphoresis. The patient states that the pain is now gone. She has had similar pain in the past however usually only last a couple of minutes and occurs mainly at night. States she recently underwent a nuclear stress test that did not show any concerning findings. She denies any recent fevers, diarrhea.   Past Medical History  Diagnosis Date  . GERD (gastroesophageal reflux disease)     There are no active problems to display for this patient.   Past Surgical History  Procedure Laterality Date  . Esophagogastroduodenoscopy N/A 09/22/2014    Procedure: ESOPHAGOGASTRODUODENOSCOPY (EGD);  Surgeon: Wallace CullensPaul Y Oh, MD;  [facility] END[facility];  Service: Gastroenterology;  Laterality: N/A;    Current Outpatient Rx  Name  Route  Sig  Dispense  Refill  . Ascorbic Acid (VITAMIN C) 1000 MG tablet   Oral   Take 1,000 mg by mouth 2 (two) times daily.         . Cholecalciferol (VITAMIN D3) 5000 UNITS CAPS   Oral   Take 1 capsule by mouth daily.         . Coenzyme Q10 100 MG capsule   Oral   Take 100 mg by mouth daily.          . Cranberry Fruit 405 MG CAPS   Oral   Take 1 capsule by mouth 2 (two) times daily.          . Evening Primrose Oil 500 MG CAPS   Oral   Take 1 capsule by mouth 2 (two) times daily.          Marland Kitchen. FLUOCINOLONE ACETONIDE  SCALP 0.01 % OIL   Other   1 application by Other route daily as needed (psoriasis on scalp).          Marland Kitchen. L-Lysine 1000 MG TABS   Oral   Take 1 tablet by mouth 2 (two) times daily.          Marland Kitchen. levothyroxine (LEVOXYL) 100 MCG tablet   Oral   Take 100 mcg by mouth daily before breakfast.          . Multiple Vitamin (MULTIVITAMIN WITH MINERALS) TABS tablet   Oral   Take 1 tablet by mouth daily.         . NON FORMULARY   Oral   Take 1 tablet by mouth 2 (two) times daily. calcium hydroxyapatite         . NON FORMULARY   Oral   Take 1 capsule by mouth 2 (two) times daily. With lunch and dinner. lactate prophase papain bromein         . NON FORMULARY   Oral   Take 1 tablet by mouth 2 (two) times daily. Quercetine         . NON FORMULARY   Oral   Take 1 Dose by mouth daily.  Mixes these 3 OTC triple fiber, fiber smart, psyllium in a cup of juice every morning         . NON FORMULARY   Oral   Take 1 capsule by mouth 2 (two) times daily. Eye health with lutein         . omeprazole (PRILOSEC) 20 MG capsule   Oral   Take 20 mg by mouth daily.          . Probiotic Product (PROBIOTIC DAILY PO)   Oral   Take 2 tablets by mouth at bedtime.         . Red Yeast Rice Extract 600 MG CAPS   Oral   Take 1 capsule by mouth 2 (two) times daily.          . TURMERIC CURCUMIN PO   Oral   Take 1 capsule by mouth daily. Use. One bid         . vitamin E 400 UNIT capsule   Oral   Take 400 Units by mouth 2 (two) times daily.            Allergies Penicillin g; Metronidazole; Other; Petrolatum-zinc oxide; and Prednisone  No family history on file.  Social History History  Substance Use Topics  . Smoking status: Never Smoker   . Smokeless tobacco: Not on file  . Alcohol Use: 2.4 oz/week    0 Standard drinks or equivalent, 4 Glasses of wine per week    Review of Systems  Constitutional: Negative for fever. Cardiovascular: Positive for chest  pain Respiratory: Negative for shortness of breath. Gastrointestinal: Negative for abdominal pain, vomiting and diarrhea. Genitourinary: Negative for dysuria. Musculoskeletal: Positive for pain between shoulder blades. Skin: Negative for rash. Neurological: Negative for headaches, focal weakness or numbness.  10-point ROS otherwise negative.  ____________________________________________   PHYSICAL EXAM:  VITAL SIGNS: ED Triage Vitals  Enc Vitals Group     BP 12/03/14 1117 151/72 mmHg     Pulse Rate 12/03/14 1117 62     Resp 12/03/14 1117 16     Temp 12/03/14 1117 97.7 F (36.5 C)     Temp Source 12/03/14 1117 Oral     SpO2 12/03/14 1117 96 %     Weight 12/03/14 1117 110 lb (49.896 kg)     Height 12/03/14 1117 4\' 11"  (1.499 m)     Head Cir --      Peak Flow --      Pain Score 12/03/14 1117 1   Constitutional: Alert and oriented. Well appearing and in no distress. Eyes: Conjunctivae are normal. PERRL. Normal extraocular movements. ENT   Head: Normocephalic and atraumatic.   Nose: No congestion/rhinnorhea.   Mouth/Throat: Mucous membranes are moist.   Neck: No stridor. Hematological/Lymphatic/Immunilogical: No cervical lymphadenopathy. Cardiovascular: Normal rate, regular rhythm.  No murmurs, rubs, or gallops. Respiratory: Normal respiratory effort without tachypnea nor retractions. Breath sounds are clear and equal bilaterally. No wheezes/rales/rhonchi. Gastrointestinal: Soft and nontender. No distention. There is no CVA tenderness. Genitourinary: Deferred Musculoskeletal: Normal range of motion in all extremities. No joint effusions.  No lower extremity tenderness nor edema. Neurologic:  Normal speech and language. No gross focal neurologic deficits are appreciated. Speech is normal.  Skin:  Skin is warm, dry and intact. No rash noted. Psychiatric: Mood and affect are normal. Speech and behavior are normal. Patient exhibits appropriate insight and  judgment.  ____________________________________________    LABS (pertinent positives/negatives)  Labs Reviewed  BASIC METABOLIC PANEL - Abnormal; Notable for the  following:    Sodium 132 (*)    Chloride 99 (*)    Glucose, Bld 111 (*)    Calcium 8.5 (*)    All other components within normal limits  HEPATIC FUNCTION PANEL - Abnormal; Notable for the following:    Bilirubin, Direct <0.1 (*)    All other components within normal limits  CBC  TROPONIN I  TROPONIN I     ____________________________________________   EKG  I, Phineas Semen, attending physician, personally viewed and interpreted this EKG  EKG Time: 1120 Rate: 59 Rhythm: Sinus bradycardia Axis: Left axis deviation Intervals: qtc 401 QRS: Narrow ST changes: No ST elevation    ____________________________________________    RADIOLOGY  CXR IMPRESSION: 1. No acute cardiopulmonary abnormalities. 2. Pulmonary fibrosis 3. Aortic atherosclerosis  ____________________________________________   PROCEDURES  Procedure(s) performed: None  Critical Care performed: No  ____________________________________________   INITIAL IMPRESSION / ASSESSMENT AND PLAN / ED COURSE  Pertinent labs & imaging results that were available during my care of the patient were reviewed by me and considered in my medical decision making (see chart for details).  Patient presents to the emergency department today because of chest pain. 2 sets of troponin were negative. Patient did not have any recurrence of the pain whilst in the emergency department. Discussed with patient that I think unlikely ACS. Will have patient follow up with PCP.  ____________________________________________   FINAL CLINICAL IMPRESSION(S) / ED DIAGNOSES  Final diagnoses:  Chest pain, unspecified chest pain type     Phineas Semen, MD 12/03/14 1544

## 2015-01-05 ENCOUNTER — Institutional Professional Consult (permissible substitution): Payer: Medicare Other | Admitting: Pulmonary Disease

## 2015-04-11 ENCOUNTER — Other Ambulatory Visit: Payer: Self-pay | Admitting: Internal Medicine

## 2015-04-11 DIAGNOSIS — Z1231 Encounter for screening mammogram for malignant neoplasm of breast: Secondary | ICD-10-CM

## 2015-05-22 ENCOUNTER — Ambulatory Visit: Payer: Medicare Other

## 2015-05-25 ENCOUNTER — Other Ambulatory Visit: Payer: Self-pay | Admitting: Internal Medicine

## 2015-05-25 ENCOUNTER — Ambulatory Visit
Admission: RE | Admit: 2015-05-25 | Discharge: 2015-05-25 | Disposition: A | Discharge status: Home / Self Care | Payer: Medicare Other | Source: Ambulatory Visit | Attending: Internal Medicine | Admitting: Internal Medicine

## 2015-05-25 DIAGNOSIS — Z1231 Encounter for screening mammogram for malignant neoplasm of breast: Secondary | ICD-10-CM | POA: Insufficient documentation

## 2015-07-24 ENCOUNTER — Ambulatory Visit
Admission: RE | Admit: 2015-07-24 | Discharge: 2015-07-24 | Disposition: A | Discharge status: Home / Self Care | Payer: Medicare Other | Source: Ambulatory Visit | Attending: Internal Medicine | Admitting: Internal Medicine

## 2015-07-24 ENCOUNTER — Other Ambulatory Visit: Payer: Self-pay | Admitting: Internal Medicine

## 2015-07-24 DIAGNOSIS — R05 Cough: Secondary | ICD-10-CM

## 2015-07-24 DIAGNOSIS — R0602 Shortness of breath: Secondary | ICD-10-CM

## 2015-07-24 DIAGNOSIS — R059 Cough, unspecified: Secondary | ICD-10-CM

## 2015-07-24 DIAGNOSIS — J8410 Pulmonary fibrosis, unspecified: Secondary | ICD-10-CM | POA: Insufficient documentation

## 2015-07-31 ENCOUNTER — Other Ambulatory Visit: Payer: Self-pay | Admitting: Neurology

## 2015-07-31 DIAGNOSIS — R51 Headache: Secondary | ICD-10-CM

## 2015-07-31 DIAGNOSIS — Y92009 Unspecified place in unspecified non-institutional (private) residence as the place of occurrence of the external cause: Principal | ICD-10-CM

## 2015-07-31 DIAGNOSIS — R519 Headache, unspecified: Secondary | ICD-10-CM

## 2015-07-31 DIAGNOSIS — W19XXXA Unspecified fall, initial encounter: Secondary | ICD-10-CM

## 2015-08-02 ENCOUNTER — Ambulatory Visit
Admission: RE | Admit: 2015-08-02 | Discharge: 2015-08-02 | Disposition: A | Discharge status: Home / Self Care | Payer: Medicare Other | Source: Ambulatory Visit | Attending: Neurology | Admitting: Neurology

## 2015-08-02 DIAGNOSIS — R519 Headache, unspecified: Secondary | ICD-10-CM

## 2015-08-02 DIAGNOSIS — R51 Headache: Secondary | ICD-10-CM | POA: Insufficient documentation

## 2015-08-09 ENCOUNTER — Encounter: Payer: Self-pay | Admitting: *Deleted

## 2015-08-15 ENCOUNTER — Ambulatory Visit: Payer: Medicare Other | Admitting: Anesthesiology

## 2015-08-15 ENCOUNTER — Encounter
Admission: RE | Disposition: A | Discharge status: Home / Self Care | Payer: Self-pay | Source: Ambulatory Visit | Attending: Ophthalmology

## 2015-08-15 ENCOUNTER — Ambulatory Visit
Admission: RE | Admit: 2015-08-15 | Discharge: 2015-08-15 | Disposition: A | Discharge status: Home / Self Care | Payer: Medicare Other | Source: Ambulatory Visit | Attending: Ophthalmology | Admitting: Ophthalmology

## 2015-08-15 ENCOUNTER — Encounter: Payer: Self-pay | Admitting: *Deleted

## 2015-08-15 DIAGNOSIS — E079 Disorder of thyroid, unspecified: Secondary | ICD-10-CM | POA: Insufficient documentation

## 2015-08-15 DIAGNOSIS — Z888 Allergy status to other drugs, medicaments and biological substances status: Secondary | ICD-10-CM | POA: Insufficient documentation

## 2015-08-15 DIAGNOSIS — K2210 Ulcer of esophagus without bleeding: Secondary | ICD-10-CM | POA: Diagnosis not present

## 2015-08-15 DIAGNOSIS — M1990 Unspecified osteoarthritis, unspecified site: Secondary | ICD-10-CM | POA: Insufficient documentation

## 2015-08-15 DIAGNOSIS — Z882 Allergy status to sulfonamides status: Secondary | ICD-10-CM | POA: Diagnosis not present

## 2015-08-15 DIAGNOSIS — H43312 Vitreous membranes and strands, left eye: Secondary | ICD-10-CM | POA: Diagnosis not present

## 2015-08-15 DIAGNOSIS — K5790 Diverticulosis of intestine, part unspecified, without perforation or abscess without bleeding: Secondary | ICD-10-CM | POA: Diagnosis not present

## 2015-08-15 DIAGNOSIS — I10 Essential (primary) hypertension: Secondary | ICD-10-CM | POA: Diagnosis not present

## 2015-08-15 DIAGNOSIS — Z9841 Cataract extraction status, right eye: Secondary | ICD-10-CM | POA: Insufficient documentation

## 2015-08-15 DIAGNOSIS — Z9981 Dependence on supplemental oxygen: Secondary | ICD-10-CM | POA: Diagnosis not present

## 2015-08-15 DIAGNOSIS — R0602 Shortness of breath: Secondary | ICD-10-CM | POA: Diagnosis not present

## 2015-08-15 DIAGNOSIS — K589 Irritable bowel syndrome without diarrhea: Secondary | ICD-10-CM | POA: Insufficient documentation

## 2015-08-15 DIAGNOSIS — K219 Gastro-esophageal reflux disease without esophagitis: Secondary | ICD-10-CM | POA: Insufficient documentation

## 2015-08-15 DIAGNOSIS — R05 Cough: Secondary | ICD-10-CM | POA: Diagnosis not present

## 2015-08-15 DIAGNOSIS — J8410 Pulmonary fibrosis, unspecified: Secondary | ICD-10-CM | POA: Diagnosis not present

## 2015-08-15 HISTORY — DX: Polyneuropathy, unspecified: G62.9

## 2015-08-15 HISTORY — DX: Cough, unspecified: R05.9

## 2015-08-15 HISTORY — DX: Hypothyroidism, unspecified: E03.9

## 2015-08-15 HISTORY — DX: Pulmonary fibrosis, unspecified: J84.10

## 2015-08-15 HISTORY — PX: PARS PLANA VITRECTOMY: SHX2166

## 2015-08-15 HISTORY — DX: Bell's palsy: G51.0

## 2015-08-15 HISTORY — DX: Hypoxemia: R09.02

## 2015-08-15 HISTORY — DX: Cough: R05

## 2015-08-15 HISTORY — DX: Unspecified osteoarthritis, unspecified site: M19.90

## 2015-08-15 HISTORY — DX: Irritable bowel syndrome without diarrhea: K58.9

## 2015-08-15 HISTORY — DX: Reserved for inherently not codable concepts without codable children: IMO0001

## 2015-08-15 HISTORY — PX: MEMBRANE PEEL: SHX5967

## 2015-08-15 SURGERY — PARS PLANA VITRECTOMY WITH 25 GAUGE
Anesthesia: Monitor Anesthesia Care | Laterality: Left

## 2015-08-15 MED ORDER — TRIAMCINOLONE ACETONIDE 40 MG/ML IJ SUSP
INTRAMUSCULAR | Status: AC
  Filled 2015-08-15: qty 1

## 2015-08-15 MED ORDER — PHENYLEPHRINE HCL 10 % OP SOLN
1.0000 [drp] | OPHTHALMIC | Status: DC | PRN
  Administered 2015-08-15 (×2): 1 [drp] via OPHTHALMIC

## 2015-08-15 MED ORDER — LIDOCAINE HCL (CARDIAC) 20 MG/ML IV SOLN
INTRAVENOUS | Status: DC | PRN
  Administered 2015-08-15: 40 mg via INTRAVENOUS
  Administered 2015-08-15: 20 mg via INTRAVENOUS

## 2015-08-15 MED ORDER — CYCLOPENTOLATE HCL 2 % OP SOLN
1.0000 [drp] | OPHTHALMIC | Status: DC | PRN
  Administered 2015-08-15 (×2): 1 [drp] via OPHTHALMIC

## 2015-08-15 MED ORDER — ATROPINE SULFATE 1 % OP SOLN
OPHTHALMIC | Status: DC | PRN
  Administered 2015-08-15: 2 [drp] via OPHTHALMIC

## 2015-08-15 MED ORDER — TETRACAINE HCL 0.5 % OP SOLN
OPHTHALMIC | Status: AC
  Filled 2015-08-15: qty 2

## 2015-08-15 MED ORDER — DEXAMETHASONE SODIUM PHOSPHATE 10 MG/ML IJ SOLN
INTRAMUSCULAR | Status: DC | PRN
  Administered 2015-08-15: .1 mL

## 2015-08-15 MED ORDER — CEFUROXIME OPHTHALMIC INJECTION 1 MG/0.1 ML
INJECTION | OPHTHALMIC | Status: AC
  Filled 2015-08-15: qty 0.1

## 2015-08-15 MED ORDER — BUPIVACAINE HCL (PF) 0.75 % IJ SOLN
INTRAMUSCULAR | Status: AC
  Filled 2015-08-15: qty 10

## 2015-08-15 MED ORDER — POVIDONE-IODINE 5 % OP SOLN
OPHTHALMIC | Status: AC
  Filled 2015-08-15: qty 30

## 2015-08-15 MED ORDER — HYPROMELLOSE 0.3 % OP GEL
OPHTHALMIC | Status: DC | PRN
  Administered 2015-08-15: 1 via OPHTHALMIC

## 2015-08-15 MED ORDER — FENTANYL CITRATE (PF) 100 MCG/2ML IJ SOLN
INTRAMUSCULAR | Status: DC | PRN
  Administered 2015-08-15: 50 ug via INTRAVENOUS

## 2015-08-15 MED ORDER — INDOCYANINE GREEN 25 MG IV SOLR
25.0000 mg | Freq: Once | INTRAVENOUS | Status: DC
  Filled 2015-08-15: qty 25

## 2015-08-15 MED ORDER — NEOMYCIN-POLYMYXIN-DEXAMETH 0.1 % OP OINT
TOPICAL_OINTMENT | OPHTHALMIC | Status: DC | PRN
  Administered 2015-08-15: 1

## 2015-08-15 MED ORDER — ATROPINE SULFATE 1 % OP SOLN
OPHTHALMIC | Status: AC
Start: 2015-08-15 — End: 2015-08-15
  Filled 2015-08-15: qty 5

## 2015-08-15 MED ORDER — PHENYLEPHRINE HCL 10 % OP SOLN
OPHTHALMIC | Status: AC
  Filled 2015-08-15: qty 5

## 2015-08-15 MED ORDER — CYCLOPENTOLATE HCL 2 % OP SOLN
OPHTHALMIC | Status: AC
  Filled 2015-08-15: qty 2

## 2015-08-15 MED ORDER — BSS PLUS IO SOLN
Freq: Once | INTRAOCULAR | Status: DC
  Filled 2015-08-15: qty 500

## 2015-08-15 MED ORDER — TRIAMCINOLONE ACETONIDE INTRAVITREAL INJECTION 4 MG/0.1 ML
INTRAOCULAR | Status: DC | PRN
  Administered 2015-08-15: .5 mg via INTRAOCULAR

## 2015-08-15 MED ORDER — INDOCYANINE GREEN 25 MG IV SOLR
0.5000 mg | INTRAVENOUS | Status: DC | PRN
  Administered 2015-08-15: .5 mg

## 2015-08-15 MED ORDER — NEOMYCIN-POLYMYXIN-DEXAMETH 3.5-10000-0.1 OP OINT
TOPICAL_OINTMENT | OPHTHALMIC | Status: AC
  Filled 2015-08-15: qty 3.5

## 2015-08-15 MED ORDER — LIDOCAINE HCL (PF) 4 % IJ SOLN
INTRAMUSCULAR | Status: AC
  Filled 2015-08-15: qty 5

## 2015-08-15 MED ORDER — MIDAZOLAM HCL 2 MG/2ML IJ SOLN
INTRAMUSCULAR | Status: DC | PRN
  Administered 2015-08-15: .5 mg via INTRAVENOUS
  Administered 2015-08-15: 1 mg via INTRAVENOUS

## 2015-08-15 MED ORDER — BSS PLUS IO SOLN
INTRAOCULAR | Status: DC | PRN
  Administered 2015-08-15: 1 via INTRAOCULAR

## 2015-08-15 MED ORDER — LIDOCAINE HCL (PF) 4 % IJ SOLN
INTRAMUSCULAR | Status: DC | PRN
  Administered 2015-08-15: 7 mL via OPHTHALMIC

## 2015-08-15 MED ORDER — HYALURONIDASE HUMAN 150 UNIT/ML IJ SOLN
INTRAMUSCULAR | Status: AC
  Filled 2015-08-15: qty 1

## 2015-08-15 MED ORDER — ONDANSETRON HCL 4 MG/2ML IJ SOLN
INTRAMUSCULAR | Status: DC | PRN
  Administered 2015-08-15: 4 mg via INTRAVENOUS

## 2015-08-15 MED ORDER — DEXAMETHASONE SODIUM PHOSPHATE 10 MG/ML IJ SOLN
INTRAMUSCULAR | Status: AC
  Filled 2015-08-15: qty 1

## 2015-08-15 MED ORDER — TETRACAINE HCL 0.5 % OP SOLN
OPHTHALMIC | Status: DC | PRN
  Administered 2015-08-15: 2 [drp] via OPHTHALMIC

## 2015-08-15 MED ORDER — HYPROMELLOSE 0.3 % OP GEL
OPHTHALMIC | Status: AC
  Filled 2015-08-15: qty 3.5

## 2015-08-15 MED ORDER — SODIUM CHLORIDE 0.9 % IV SOLN
INTRAVENOUS | Status: DC
  Administered 2015-08-15: 07:00:00 via INTRAVENOUS

## 2015-08-15 SURGICAL SUPPLY — 32 items
APPLICATOR COTTON TIP 6IN STRL (MISCELLANEOUS) ×12 IMPLANT
CANNULA SOFT TIP 25G (CANNULA) ×3 IMPLANT
CORD BIP STRL DISP 12FT (MISCELLANEOUS) IMPLANT
CUP MEDICINE 2OZ PLAST GRAD ST (MISCELLANEOUS) ×3 IMPLANT
ERASER HMR WETFIELD 25G (MISCELLANEOUS) IMPLANT
FORCEPS GRIESH GRASP 25G (INSTRUMENTS) IMPLANT
FORCEPS GRIESH ILM PLUS 25G (INSTRUMENTS) ×3 IMPLANT
GLOVE BIO SURGEON STRL SZ8 (GLOVE) ×3 IMPLANT
GLOVE SURG LX 6.5 MICRO (GLOVE) ×2
GLOVE SURG LX STRL 6.5 MICRO (GLOVE) ×1 IMPLANT
GLOVE SURG PR MICRO ENCORE 7 (GLOVE) ×3 IMPLANT
GOWN STRL REUS W/ TWL LRG LVL3 (GOWN DISPOSABLE) ×2 IMPLANT
GOWN STRL REUS W/TWL LRG LVL3 (GOWN DISPOSABLE) ×4
IV D5W 1000ML (IV SOLUTION) ×3 IMPLANT
LENS BIOM OPTIC SET 200MM DISP (MISCELLANEOUS) ×3 IMPLANT
LENS VITRECTOMY FLAT DISP (MISCELLANEOUS) IMPLANT
NDL RETROBULBAR .5 NSTRL (NEEDLE) ×3 IMPLANT
NDL SAFETY ECLIPSE 18X1.5 (NEEDLE) ×1 IMPLANT
NEEDLE FILTER BLUNT 18X 1/2SAF (NEEDLE) ×4
NEEDLE FILTER BLUNT 18X1 1/2 (NEEDLE) ×2 IMPLANT
NEEDLE HYPO 18GX1.5 SHARP (NEEDLE) ×2
NEEDLE HYPO 25GX1X1/2 BEV (NEEDLE) ×3 IMPLANT
PACK EYE AFTER SURG (MISCELLANEOUS) ×3 IMPLANT
PACK VITRECTOMY (MISCELLANEOUS) ×3 IMPLANT
PACK VITRECTOMY CASSETTE 25GA (MISCELLANEOUS) ×3 IMPLANT
PROBE DIRECTIONAL LASER (MISCELLANEOUS) ×3 IMPLANT
PROBE LASER ILLUM FLEX CVD 25G (OPHTHALMIC) IMPLANT
SOL PREP PVP 2OZ (MISCELLANEOUS)
SOLUTION PREP PVP 2OZ (MISCELLANEOUS) IMPLANT
STRAP SAFETY BODY (MISCELLANEOUS) ×3 IMPLANT
SUT VICRYL 7 0 TG140 8 (SUTURE) ×3 IMPLANT
SYRINGE 10CC LL (SYRINGE) ×6 IMPLANT

## 2015-08-15 NOTE — H&P (Signed)
.  Previous H&P scanned in reviewed, patient examined, and no interval changes.  Please see scanned record for complete information.   

## 2015-08-15 NOTE — Anesthesia Postprocedure Evaluation (Signed)
Anesthesia Post Note  Patient: Sherri KluverMargot S Price  Procedure(s) Performed: Procedure(s) (LRB): PARS PLANA VITRECTOMY WITH 25 GAUGE (Left) Internal limited membrane peel, endolaser (Left)  Patient location during evaluation: Short Stay Anesthesia Type: MAC Level of consciousness: awake and alert Pain management: pain level controlled Vital Signs Assessment: post-procedure vital signs reviewed and stable Respiratory status: spontaneous breathing Cardiovascular status: blood pressure returned to baseline Postop Assessment: no headache Anesthetic complications: no    Last Vitals:  Filed Vitals:   08/15/15 0841 08/15/15 0842  BP: 128/53 128/53  Pulse: 59 59  Temp: 36.8 C 36.8 C  Resp: 18 15    Last Pain:  Filed Vitals:   08/15/15 0843  PainSc: 0-No pain                 Mathews ArgyleLogan,  Da Michelle P

## 2015-08-15 NOTE — Op Note (Signed)
.  PREOPERATIVE DIAGNOSIS: Epiretinal membrane, left eye.            POST OPERATIVE DIAGNOSIS: Epiretinal membrane, left eye.               OPERATION PERFORMED: 25-gauge pars plana vitrectomy with triamcinolone, ICG, membrane peel, endolaser, air-fluid exchange, and air.                  ANESTHESIA: MAC with retrobulbar block.   COMPLICATIONS: None.     BLOOD LOSS: Minimal.   DESCRIPTION OF PROCEDURE: The patient was evaluated in the clinic for an epiretinal membrane in the left eye.  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 The risks, benefits, alternatives, and complications were discussed with the patient, and after months of consideration patient elected to proceed with pars plana vitrectomy.   On the day of surgery, the patient was greeted in the preoperative holding area.  The left eye was marked.  The patient was then brought into the operating room and placed under monitored anesthesia care.  Next, 5 ml of a retrobulbar block consisting of 4% xylocaine plain, 0.75% sensorcaine plain and hylenex 100u was injected. The left eye was then prepped and draped in the usual sterile fashion.  Wescott scissors were used to create an incision to place more block in a subtenons fashion. Three 25g trocars were placed in the usual positions. The infusion cannula was checked prior to starting the infusion to ensure it was in the correct position.  A core vitrectomy was performed. The patient did not have a posterior hyaloid detachment. Triamcinolone was used to stain the posterior hyaloid and a PVD was induced using the cutter. The posterior vitreous detachment was continued into the midperiphery, as it was very adherent and was unable to be removed past the large arcades except for temporally. The vitreous was trimmed for 360 degrees to the periphery.   ICG was then applied to the retinal surface, and then aspirated.  ILM forceps  were used to peel the epiretinal membrane and internal limiting membrane for an area of greater than one disc diameter around the fovea.  Attention was then drawn to the periphery.   A 360 degree scleral depressed exam was performed.  There were no retinal tears or breaks, but there was an area of perivascular lattice inferiorly along a major vessel that had shown traction during the induction of the posterior vitreous detachment.  An air-fluid exchange was then performed and the retinal surface was dried.  The trocars were then removed and were sutured with 7.0 vicryl to ensure they were airtight.  The eye had an acceptable pressure by palpation. Subconjunctival  Dexamethasone was injected.  The eye was then patched and shielded with Neo-Poly-Dex ointment and the patient was taken to the recovery area in stable condition.

## 2015-08-15 NOTE — Anesthesia Preprocedure Evaluation (Signed)
Anesthesia Evaluation  Patient identified by MRN, date of birth, ID band Patient awake    Reviewed: Allergy & Precautions, H&P , NPO status , Patient's Chart, lab work & pertinent test results, reviewed documented beta blocker date and time   Airway Mallampati: II  TM Distance: >3 FB Neck ROM: full    Dental no notable dental hx. (+) Teeth Intact   Pulmonary neg pulmonary ROS, shortness of breath,    Pulmonary exam normal breath sounds clear to auscultation       Cardiovascular Exercise Tolerance: Good hypertension, negative cardio ROS   Rhythm:regular Rate:Normal     Neuro/Psych negative neurological ROS  negative psych ROS   GI/Hepatic negative GI ROS, Neg liver ROS, GERD  ,  Endo/Other  negative endocrine ROSdiabetes  Renal/GU      Musculoskeletal   Abdominal   Peds  Hematology negative hematology ROS (+)   Anesthesia Other Findings   Reproductive/Obstetrics negative OB ROS                             Anesthesia Physical Anesthesia Plan  ASA: III  Anesthesia Plan: MAC   Post-op Pain Management:    Induction:   Airway Management Planned:   Additional Equipment:   Intra-op Plan:   Post-operative Plan:   Informed Consent: I have reviewed the patients History and Physical, chart, labs and discussed the procedure including the risks, benefits and alternatives for the proposed anesthesia with the patient or authorized representative who has indicated his/her understanding and acceptance.     Plan Discussed with: CRNA  Anesthesia Plan Comments:         Anesthesia Quick Evaluation

## 2015-08-15 NOTE — Anesthesia Procedure Notes (Signed)
Procedure Name: MAC Date/Time: 08/15/2015 7:49 AM Performed by: Junious SilkNOLES, Ladashia Demarinis Pre-anesthesia Checklist: Patient identified, Emergency Drugs available, Suction available, Patient being monitored and Timeout performed Oxygen Delivery Method: Nasal cannula

## 2015-08-15 NOTE — Transfer of Care (Signed)
Immediate Anesthesia Transfer of Care Note  Patient: Sherri Price  Procedure(s) Performed: Procedure(s): PARS PLANA VITRECTOMY WITH 25 GAUGE (Left) Internal limited membrane peel, endolaser (Left)  Patient Location: PACU  Anesthesia Type:MAC  Level of Consciousness: awake and alert   Airway & Oxygen Therapy: Patient Spontanous Breathing  Post-op Assessment: Report given to RN  Post vital signs: Reviewed  Last Vitals:  Filed Vitals:   08/15/15 0841 08/15/15 0842  BP: 128/53 128/53  Pulse: 59 59  Temp: 36.8 C 36.8 C  Resp: 18 15    Complications: No apparent anesthesia complications

## 2015-08-15 NOTE — Discharge Instructions (Signed)

## 2016-02-28 IMAGING — CR DG CHEST 2V
2 series · 2 of 2 positions shown · non-contrast
Comparison: 08/29/2014

CLINICAL DATA: Chest pain.

EXAM:
CHEST  2 VIEW

[chest pa]
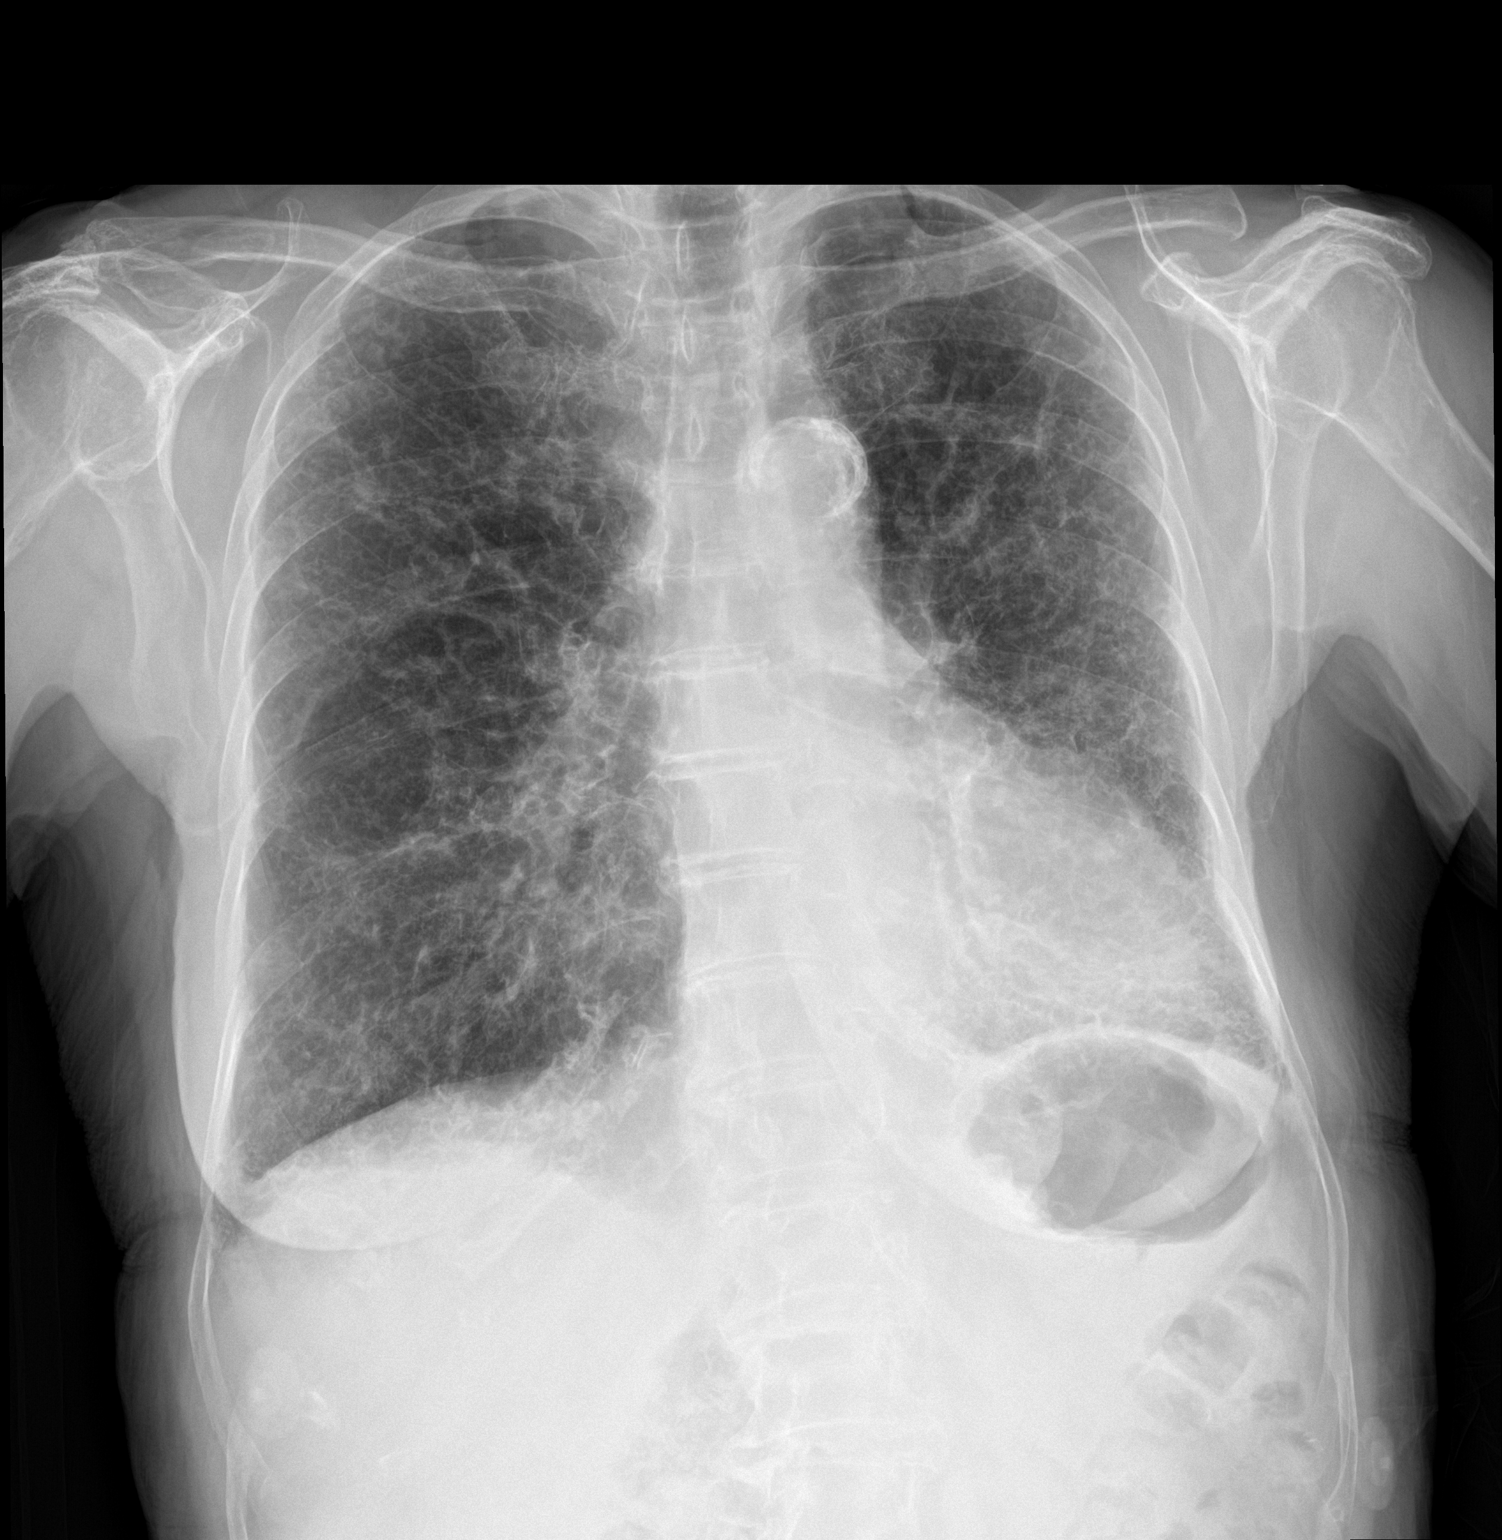

[chest lat]
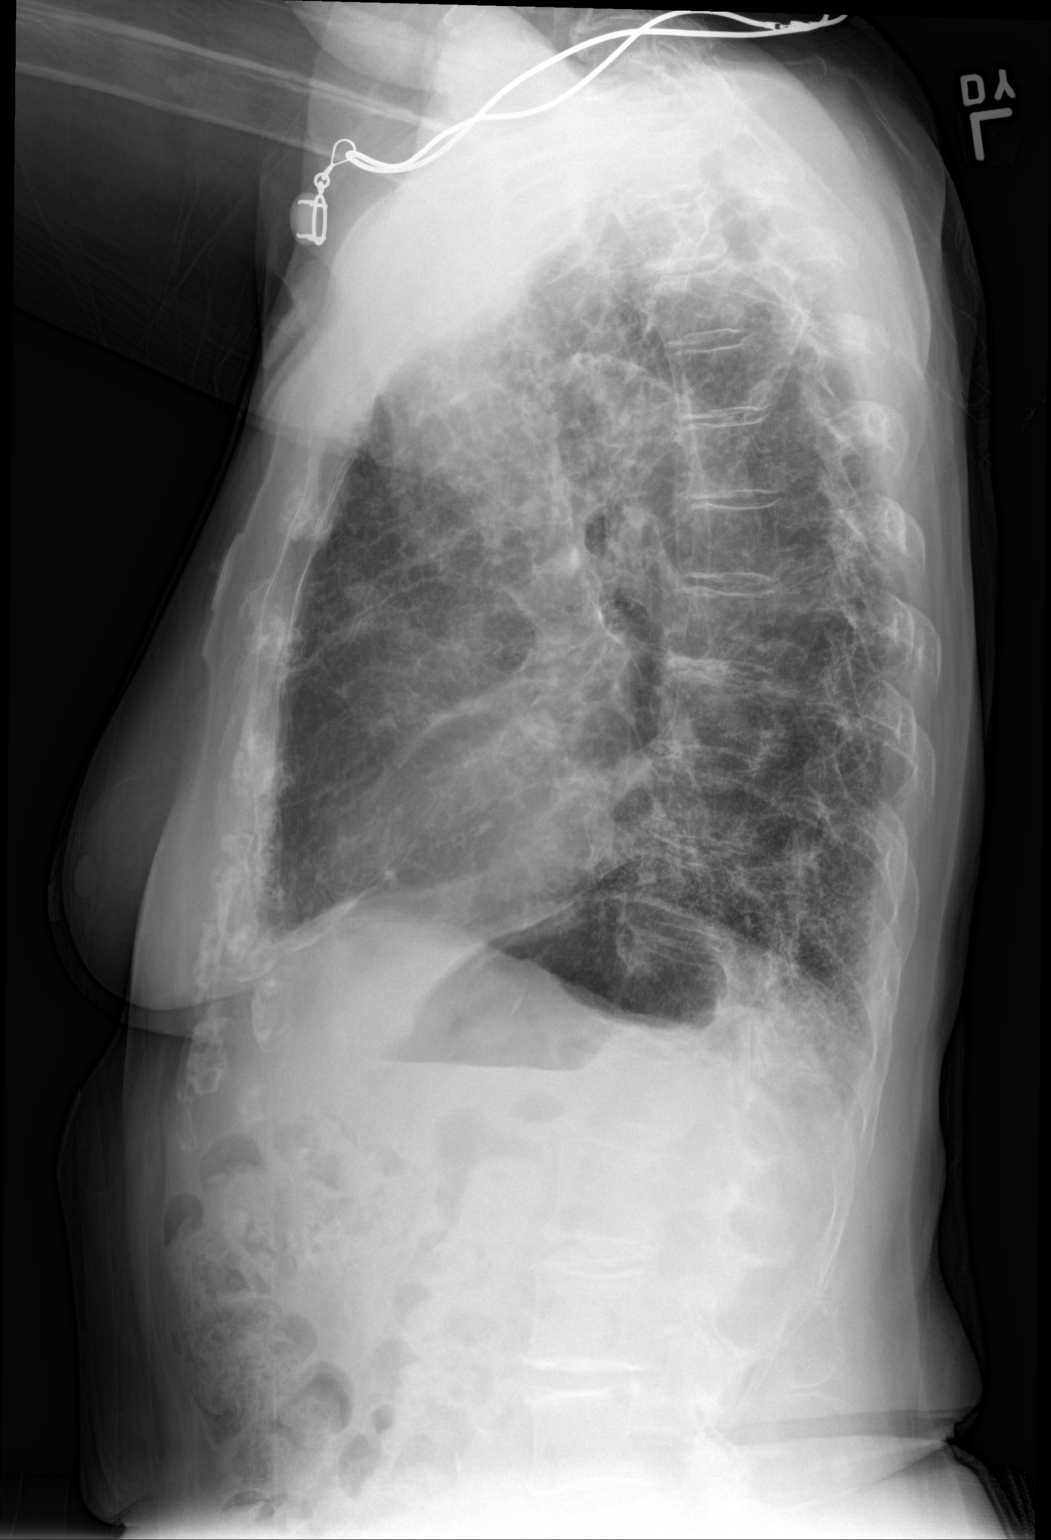

[2 of 2 positions shown; findings below may reference images not displayed]

FINDINGS: Heart size is mildly enlarged. Aortic atherosclerosis identified.
Diffuse chronic interstitial changes compatible with pulmonary
fibrosis noted. No superimposed airspace consolidation.
IMPRESSION: 1. No acute cardiopulmonary abnormalities.
2. Pulmonary fibrosis
3. Aortic atherosclerosis

## 2016-04-17 ENCOUNTER — Other Ambulatory Visit: Payer: Self-pay | Admitting: Internal Medicine

## 2016-04-17 DIAGNOSIS — Z1231 Encounter for screening mammogram for malignant neoplasm of breast: Secondary | ICD-10-CM

## 2016-05-27 ENCOUNTER — Ambulatory Visit
Admission: RE | Admit: 2016-05-27 | Discharge: 2016-05-27 | Disposition: A | Discharge status: Home / Self Care | Payer: Medicare Other | Source: Ambulatory Visit | Attending: Internal Medicine | Admitting: Internal Medicine

## 2016-05-27 DIAGNOSIS — Z1231 Encounter for screening mammogram for malignant neoplasm of breast: Secondary | ICD-10-CM | POA: Diagnosis present

## 2016-10-14 ENCOUNTER — Other Ambulatory Visit: Payer: Self-pay | Admitting: Student

## 2016-10-14 DIAGNOSIS — R1031 Right lower quadrant pain: Secondary | ICD-10-CM

## 2016-10-14 DIAGNOSIS — R1032 Left lower quadrant pain: Principal | ICD-10-CM

## 2016-10-21 ENCOUNTER — Ambulatory Visit
Admission: RE | Admit: 2016-10-21 | Discharge: 2016-10-21 | Disposition: A | Discharge status: Home / Self Care | Payer: Medicare Other | Source: Ambulatory Visit | Attending: Student | Admitting: Student

## 2016-10-21 DIAGNOSIS — I700 Atherosclerosis of aorta: Secondary | ICD-10-CM | POA: Diagnosis not present

## 2016-10-21 DIAGNOSIS — R1031 Right lower quadrant pain: Secondary | ICD-10-CM | POA: Insufficient documentation

## 2016-10-21 DIAGNOSIS — K449 Diaphragmatic hernia without obstruction or gangrene: Secondary | ICD-10-CM | POA: Diagnosis not present

## 2016-10-21 DIAGNOSIS — K439 Ventral hernia without obstruction or gangrene: Secondary | ICD-10-CM | POA: Diagnosis not present

## 2016-10-21 DIAGNOSIS — I708 Atherosclerosis of other arteries: Secondary | ICD-10-CM | POA: Diagnosis not present

## 2016-10-21 DIAGNOSIS — R1032 Left lower quadrant pain: Secondary | ICD-10-CM | POA: Diagnosis not present

## 2016-10-21 DIAGNOSIS — K623 Rectal prolapse: Secondary | ICD-10-CM | POA: Insufficient documentation

## 2016-10-21 DIAGNOSIS — M48061 Spinal stenosis, lumbar region without neurogenic claudication: Secondary | ICD-10-CM | POA: Diagnosis not present

## 2016-10-21 MED ORDER — IOPAMIDOL (ISOVUE-300) INJECTION 61%
75.0000 mL | Freq: Once | INTRAVENOUS | Status: AC | PRN
  Administered 2016-10-21: 75 mL via INTRAVENOUS

## 2017-01-15 ENCOUNTER — Other Ambulatory Visit: Payer: Self-pay | Admitting: Internal Medicine

## 2017-01-15 ENCOUNTER — Other Ambulatory Visit: Payer: Self-pay | Admitting: Neurology

## 2017-01-15 DIAGNOSIS — R42 Dizziness and giddiness: Secondary | ICD-10-CM

## 2017-01-22 ENCOUNTER — Ambulatory Visit
Admission: RE | Admit: 2017-01-22 | Discharge: 2017-01-22 | Disposition: A | Discharge status: Home / Self Care | Payer: Medicare Other | Source: Ambulatory Visit | Attending: Internal Medicine | Admitting: Internal Medicine

## 2017-01-22 DIAGNOSIS — R42 Dizziness and giddiness: Secondary | ICD-10-CM | POA: Diagnosis present

## 2017-01-22 DIAGNOSIS — R2689 Other abnormalities of gait and mobility: Secondary | ICD-10-CM | POA: Insufficient documentation

## 2017-01-22 DIAGNOSIS — R4189 Other symptoms and signs involving cognitive functions and awareness: Secondary | ICD-10-CM | POA: Diagnosis present

## 2017-04-30 ENCOUNTER — Encounter: Payer: Self-pay | Admitting: Internal Medicine

## 2017-04-30 ENCOUNTER — Ambulatory Visit (INDEPENDENT_AMBULATORY_CARE_PROVIDER_SITE_OTHER): Payer: Medicare Other | Admitting: Internal Medicine

## 2017-04-30 DIAGNOSIS — I824Z9 Acute embolism and thrombosis of unspecified deep veins of unspecified distal lower extremity: Secondary | ICD-10-CM | POA: Insufficient documentation

## 2017-04-30 DIAGNOSIS — R918 Other nonspecific abnormal finding of lung field: Secondary | ICD-10-CM

## 2017-04-30 DIAGNOSIS — I2699 Other pulmonary embolism without acute cor pulmonale: Secondary | ICD-10-CM

## 2017-04-30 DIAGNOSIS — I824Z1 Acute embolism and thrombosis of unspecified deep veins of right distal lower extremity: Secondary | ICD-10-CM

## 2017-04-30 DIAGNOSIS — J84112 Idiopathic pulmonary fibrosis: Secondary | ICD-10-CM | POA: Insufficient documentation

## 2017-04-30 DIAGNOSIS — R0602 Shortness of breath: Secondary | ICD-10-CM

## 2017-04-30 NOTE — Progress Notes (Signed)
[facility] [address] [place], [place] [address]  Pulmonary Sleep Medicine  Office Visit Note  Patient Name: Sherri Price  [id number]  [id number]  Date of Service: 04/30/2017     Complaints/HPI: [place] was on a [place] recently to [place] and had a flare of her breathing. She was actually [place] [facility] and was in for a week. Patient then came home and was admitted the [facility]. Patient was also found to have blood clot. She has now been started on xaralto she states that she thinks the elevation had something to do with it. Her husband states that she was also found to have pulmonary nodules. She does have a history of Pulmonary fibrosis. Patient states that she has now been on oxygen all the time. She is doing a little better now  Current Medication: Outpatient Encounter Medications as of 04/30/2017  Medication Sig Note  . Ascorbic Acid (VITAMIN C) 1000 MG tablet Take 1,000 mg by mouth 2 (two) times daily.   . Cholecalciferol (VITAMIN D3) 5000 UNITS CAPS Take 1 capsule by mouth daily.   . Coenzyme Q10 100 MG capsule Take 100 mg by mouth daily.  09/15/2014: .   Marland Kitchen. Cranberry Fruit 405 MG CAPS Take 1 capsule by mouth 2 (two) times daily.  09/15/2014: .   Marland Kitchen. Evening Primrose Oil 500 MG CAPS Take 1 capsule by mouth 2 (two) times daily.  09/15/2014: .  Marland Kitchen. FLUOCINOLONE ACETONIDE SCALP 0.01 % OIL 1 application by Other route daily as needed (psoriasis on scalp).  09/15/2014: .  Marland Kitchen. L-Lysine 1000 MG TABS Take 1 tablet by mouth 2 (two) times daily.  09/15/2014: .  . levothyroxine (LEVOXYL) 100 MCG tablet Take 100 mcg by mouth daily before breakfast.  09/15/2014: .   Marland Kitchen. Multiple Vitamin (MULTIVITAMIN WITH MINERALS) TABS tablet Take 1 tablet by mouth daily. 09/15/2014: Stopping for procedure   . NON FORMULARY Take 1 tablet by mouth 2 (two) times daily. calcium hydroxyapatite   . NON FORMULARY Take 1 tablet by mouth 2 (two) times daily. Quercetine   . NON FORMULARY Take 1 Dose by  mouth daily. Mixes these 3 OTC triple fiber, fiber smart, psyllium in a cup of juice every morning   . NON FORMULARY Take 1 capsule by mouth 2 (two) times daily. Eye health with lutein   . omeprazole (PRILOSEC) 20 MG capsule Take 20 mg by mouth daily.  09/15/2014: .  Marland Kitchen. Probiotic Product (PROBIOTIC DAILY PO) Take 2 tablets by mouth at bedtime.   . Red Yeast Rice Extract 600 MG CAPS Take 1 capsule by mouth 2 (two) times daily.  09/15/2014: .   . rivaroxaban (XARELTO) 20 MG TABS tablet Take 20 mg by mouth 2 (two) times daily at 10 AM and 5 PM.   . TURMERIC CURCUMIN PO Take 1 capsule by mouth daily. Use. One bid 09/15/2014: .   . vitamin E 400 UNIT capsule Take 400 Units by mouth 2 (two) times daily.  09/15/2014: .  Marland Kitchen. NON FORMULARY Take 1 capsule by mouth 2 (two) times daily. With lunch and dinner. lactate prophase papain bromein    No facility-administered encounter medications on file as of 04/30/2017.     Surgical History: Past Surgical History:  Procedure Laterality Date  . APPENDECTOMY    . ESOPHAGOGASTRODUODENOSCOPY N/A 09/22/2014   Procedure: ESOPHAGOGASTRODUODENOSCOPY (EGD);  Surgeon: Wallace CullensPaul Y Oh, MD;  [facility];  Service: Gastroenterology;  Laterality: N/A;  . EYE SURGERY    .  MEMBRANE PEEL Left 08/15/2015   Procedure: Internal limited membrane peel, endolaser;  Surgeon: Marcelene ButteJessica Keewatin, MD;  Location: [facility] ORS;  Service: Ophthalmology;  Laterality: Left;  . PARS PLANA VITRECTOMY Left 08/15/2015   Procedure: PARS PLANA VITRECTOMY WITH 25 GAUGE;  Surgeon: Marcelene ButteJessica Sabinal, MD;  Location: [facility] ORS;  Service: Ophthalmology;  Laterality: Left;  . SHOULDER ARTHROSCOPY W/ ROTATOR CUFF REPAIR    . THUMBS      Medical History: Past Medical History:  Diagnosis Date  . Arthritis   . Bell's palsy   . Cough    CHRONIC  . GERD (gastroesophageal reflux disease)   . Hypothyroidism   . IBS (irritable bowel syndrome)   . Neuropathy   . Oxygen decrease    USES AT NIGHT  . Pulmonary  fibrosis (HCC)   . Shortness of breath dyspnea     Family History: Family History  Problem Relation Age of Onset  . Breast cancer Neg Hx     Social History: Social History   Socioeconomic History  . Marital status: Married    Spouse name: Not on file  . Number of children: Not on file  . Years of education: Not on file  . Highest education level: Not on file  Social Needs  . Financial resource strain: Not on file  . Food insecurity - worry: Not on file  . Food insecurity - inability: Not on file  . Transportation needs - medical: Not on file  . Transportation needs - non-medical: Not on file  Occupational History  . Not on file  Tobacco Use  . Smoking status: Never Smoker  . Smokeless tobacco: Never Used  Substance and Sexual Activity  . Alcohol use: Yes    Alcohol/week: 0.6 oz    Types: 1 Glasses of wine per week  . Drug use: No  . Sexual activity: Not on file  Other Topics Concern  . Not on file  Social History Narrative  . Not on file     ROS  General: (-) fever, (-) chills, (-) night sweats, +weakness, (-) changes in appetite. Skin: (-) rashes, (-) itching,. Eyes: (-) visual changes, (-) redness, (-) itching, (-) double or blurred vision. Nose and Sinuses: (-) nasal stuffiness or itchiness, (-) postnasal drip, (-) nosebleeds, (-) sinus trouble. Mouth and Throat: (-) sore throat, (-) hoarseness. Neck: (-) swollen glands, (-) enlarged thyroid, (-) neck pain. Respiratory: +cough, (-) bloody sputum, +shortness of breath, (-) wheezing. Cardiovascular: (-) ankle swelling, (-) chest pain. Lymphatic: (-) lymph node enlargement, (-) lymph node tenderness. Neurologic: (-) numbness, (-) tingling,(-) dizziness. Psychiatric: (-) anxiety, (-) depression.  Vital Signs: Blood pressure (!) 106/55, pulse 75, resp. rate 16, height 4\' 10"  (1.473 m), weight 104 lb 6.4 oz (47.4 kg), SpO2 97 %.  Examination: General Appearance: The patient is well-developed, well-nourished,  and in no distress. Skin: Gross inspection of skin demonstrates no evidence of abnormality. Head: Patient's head is normocephalic, no gross deformities. Eyes: no gross deformities noted. ENT: ears appear grossly normal. Nasopharynx appears to be normal. Neck: Supple. No thyromegaly. No LAD. Respiratory: Lungs are showing crackles dry velcro type. Cardiovascular: Normal S1 and S2 with murmur. Extremities: No cyanosis. pulses are equal. Neurologic: Alert and oriented. No involuntary movements.  LABS: No results found for this or any previous visit (from the past 2160 hour(s)).  Radiology: Mr Brain Wo Contrast  Result Date: 01/22/2017 CLINICAL DATA:  83 year old female with dizziness. Feeling off balance for 1 year. No known injury, and denies  headache. EXAM: MRI HEAD WITHOUT CONTRAST TECHNIQUE: Multiplanar, multiecho pulse sequences of the brain and surrounding structures were obtained without intravenous contrast. COMPARISON:  Head CT without contrast 08/02/2015. Brain MRI 03/03/2008 FINDINGS: Brain: Mildly decreased cerebral volume since 2009, but volume remains within normal limits for age. No restricted diffusion to suggest acute infarction. No midline shift, mass effect, evidence of mass lesion, ventriculomegaly, extra-axial collection or acute intracranial hemorrhage. Cervicomedullary junction and pituitary are within normal limits. Wallace Cullens and white matter signal is within normal limits for age throughout the brain. No cortical encephalomalacia or chronic cerebral blood products. Normal brainstem and cerebellum. Visible internal auditory structures appear normal. Vascular: Major intracranial vascular flow voids are stable since 2009, with mild intracranial artery tortuosity which is most pronounced at the vertebrobasilar junction. Skull and upper cervical spine: Visible cervical spine is negative for age. Normal bone marrow signal. Sinuses/Orbits: Interval postoperative changes to the left globe.  Otherwise stable and negative orbits soft tissues. Paranasal sinuses and mastoids are stable and well pneumatized. Other: Scalp and face soft tissues appear negative. IMPRESSION: Normal for age noncontrast MRI appearance of the brain. No explanation for dizziness. Electronically Signed   By: Odessa Fleming M.D.   On: 01/22/2017 13:44    No results found.  No results found.    Assessment and Plan: There are no active problems to display for this patient.   1. Pulmonary embolism She is on anticoagulation and will be continued Will get follow up CT in 3 months  2. Idiopathic Pulmonary fibrosis She has advanced disease now with decline related to above Will continue with supportive care She needs to be more compliant with oxygen therapy  3. DVT As above on anticoagulation Likely related to travel  4. Shortness of breath Will continue with oxygen therapy ?Pulmonary rehab  5. Pulmonary Nodules I suspect this is due to the fibrosis Will have radiology review outside scan and also will get follow up CT as noted   General Counseling: I have discussed the findings of the evaluation and examination with Robbie.  I have also discussed any further diagnostic evaluation thatmay be needed or ordered today. Mariellen verbalizes understanding of the findings of todays visit. We also reviewed her medications today and discussed drug interactions and side effects including but not limited excessive drowsiness and altered mental states. We also discussed that there is always a risk not just to her but also people around her. she has been encouraged to call the office with any questions or concerns that should arise related to todays visit.    Time spent:80min  I have personally obtained a history, examined the patient, evaluated laboratory and imaging results, formulated the assessment and plan and placed orders.    Yevonne Pax, MD [facility] Pulmonary and Critical Care Sleep medicine

## 2017-04-30 NOTE — Patient Instructions (Signed)
Pulmonary Embolism A pulmonary embolism (PE) is a sudden blockage or decrease of blood flow in one lung or both lungs. Most blockages come from a blood clot that travels from the legs or the pelvis to the lungs. PE is a dangerous and potentially life-threatening condition if it is not treated right away. What are the causes? A pulmonary embolism occurs most commonly when a blood clot travels from one of your veins to your lungs. Rarely, PE is caused by air, fat, amniotic fluid, or part of a tumor traveling through your veins to your lungs. What increases the risk? A PE is more likely to develop in:  People who smoke.  People who areolder, especially over 17 years of age.  People who are overweight (obese).  People who sit or lie still for a long time, such as during long-distance travel (over 4 hours), bed rest, hospitalization, or during recovery from certain medical conditions like a stroke.  People who do not engage in much physical activity (sedentary lifestyle).  People who have chronic breathing disorders.  People whohave a personal or family history of blood clots or blood clotting disease.  People whohave peripheral vascular disease (PVD), diabetes, or some types of cancer.  People who haveheart disease, especially if the person had a recent heart attack or has congestive heart failure.  People who have neurological diseases that affect the legs (leg paresis).  People who have had a traumatic injury, such as breaking a hip or leg.  People whohave recently had major or lengthy surgery, especially on the hip, knee, or abdomen.  People who have hada central line placed inside a large vein.  People who takemedicines that contain the hormone estrogen. These include birth control pills and hormone replacement therapy.  Pregnancy or during childbirth or the postpartum period.  What are the signs or symptoms? The symptoms of a PE usually start suddenly and  include:  Shortness of breath while active or at rest.  Coughing or coughing up blood or blood-tinged mucus.  Chest pain that is often worse with deep breaths.  Rapid or irregular heartbeat.  Feeling light-headed or dizzy.  Fainting.  Feelinganxious.  Sweating.  There may also be pain and swelling in a leg if that is where the blood clot started. These symptoms may represent a serious problem that is an emergency. Do not wait to see if the symptoms will go away. Get medical help right away. Call your local emergency services (911 in the [place] Do not drive yourself to the hospital. How is this diagnosed? Your health care provider will take a medical history and perform a physical exam. You may also have other tests, including:  Blood tests to assess the clotting properties of your blood, assess oxygen levels in your blood, and find blood clots.  Imaging tests, such as CT, ultrasound, MRI, X-ray, and other tests to see if you have clots anywhere in your body.  An electrocardiogram (ECG) to look for heart strain from blood clots in the lungs.  How is this treated? The main goals of PE treatment are:  To stop a blood clot from growing larger.  To stop new blood clots from forming.  The type of treatment that you receive depends on many factors, such as the cause of your PE, your risk for bleeding or developing more clots, and other medical conditions that you have. Sometimes, a combination of treatments is necessary. This condition may be treated with:  Medicines, including newer oral blood thinners (  anticoagulants), warfarin, low molecular weight heparins, thrombolytics, or heparins.  Wearing compression stockings or using different types of devices.  Surgery (rare) to remove the blood clot or to place a filter in your abdomen to stop the blood clot from traveling to your lungs.  Treatments for a PE are often divided into immediate treatment, long-term treatment (up to 3  months after PE), and extended treatment (more than 3 months after PE). Your treatment may continue for several months. This is called maintenance therapy, and it is used to prevent the forming of new blood clots. You can work with your health care provider to choose the treatment program that is best for you. What are anticoagulants? Anticoagulants are medicines that treat PEs. They can stop current blood clots from growing and stop new clots from forming. They cannot dissolve existing clots. Your body dissolves clots by itself over time. Anticoagulants are given by mouth, by injection, or through an IV tube. What are thrombolytics? Thrombolytics are clot-dissolving medicines that are used to dissolve a PE. They carry a high risk of bleeding, so they tend to be used only in severe cases or if you have very low blood pressure. Follow these instructions at home: If you are taking a newer oral anticoagulant:  Take the medicine every single day at the same time each day.  Understand what foods and drugs interact with this medicine.  Understand that there are no regular blood tests required when using this medicine.  Understandthe side effects of this medicine, including excessive bruising or bleeding. Ask your health care provider or pharmacist about other possible side effects. If you are taking warfarin:  Understand how to take warfarin and know which foods can affect how warfarin works in Veterinary surgeon.  Understand that it is dangerous to taketoo much or too little warfarin. Too much warfarin increases the risk of bleeding. Too little warfarin continues to allow the risk for blood clots.  Follow your PT and INR blood testing schedule. The PT and INR results allow your health care provider to adjust your dose of warfarin. It is very important that you have your PT and INR tested as often as told by your health care provider.  Avoid major changes in your diet, or tell your health care provider  before you change your diet. Arrange a visit with a registered dietitian to answer your questions. Many foods, especially foods that are high in vitamin K, can interfere with warfarin and affect the PT and INR results. Eat a consistent amount of foods that are high in vitamin K, such as: ? Spinach, kale, broccoli, cabbage, collard greens, turnip greens, Brussels sprouts, peas, cauliflower, seaweed, and parsley. ? Beef liver and pork liver. ? Green tea. ? Soybean oil.  Tell your health care provider about any and all medicines, vitamins, and supplements that you take, including aspirin and other over-the-counter anti-inflammatory medicines. Be especially cautious with aspirin and anti-inflammatory medicines. Do not take those before you ask your health care provider if it is safe to do so. This is important because many medicines can interfere with warfarin and affect the PT and INR results.  Do not start or stop taking any over-the-counter or prescription medicine unless your health care provider or pharmacist tells you to do so. If you take warfarin, you will also need to do these things:  Hold pressure over cuts for longer than usual.  Tell your dentist and other health care providers that you are taking warfarin before you have  any procedures in which bleeding may occur.  Avoid alcohol or drink very small amounts. Tell your health care provider if you change your alcohol intake.  Do not use tobacco products, including cigarettes, chewing tobacco, and e-cigarettes. If you need help quitting, ask your health care provider.  Avoid contact sports.  General instructions  Take over-the-counter and prescription medicines only as told by your health care provider. Anticoagulant medicines can have side effects, including easy bruising and difficulty stopping bleeding. If you are prescribed an anticoagulant, you will also need to do these things: ? Hold pressure over cuts for longer than  usual. ? Tell your dentist and other health care providers that you are taking anticoagulants before you have any procedures in which bleeding may occur. ? Avoid contact sports.  Wear a medical alert bracelet or carry a medical alert card that says you have had a PE.  Ask your health care provider how soon you can go back to your normal activities. Stay active to prevent new blood clots from forming.  Make sure to exercise while traveling or when you have been sitting or standing for a long period of time. It is very important to exercise. Exercise your legs by walking or by tightening and relaxing your leg muscles often. Take frequent walks.  Wear compression stockings as told by your health care provider to help prevent more blood clots from forming.  Do not use tobacco products, including cigarettes, chewing tobacco, and e-cigarettes. If you need help quitting, ask your health care provider.  Keep all follow-up appointments with your health care provider. This is important. How is this prevented? Take these actions to decrease your risk of developing another PE:  Exercise regularly. For at least 30 minutes every day, engage in: ? Activity that involves moving your arms and legs. ? Activity that encourages good blood flow through your body by increasing your heart rate.  Exercise your arms and legs every hour during long-distance travel (over 4 hours). Drink plenty of water and avoid drinking alcohol while traveling.  Avoid sitting or lying in bed for long periods of time without moving your legs.  Maintain a weight that is appropriate for your height. Ask your health care provider what weight is healthy for you.  If you are a woman who is over 21 years of age, avoid unnecessary use of medicines that contain estrogen. These include birth control pills.  Do not smoke, especially if you take estrogen medicines. If you need help quitting, ask your health care provider.  If you are at  very high risk for PE, wear compression stockings.  If you recently had a PE, have regularly scheduled ultrasound testing on your legs to check for new blood clots.  If you are hospitalized, prevention measures may include:  Early walking after surgery, as soon as your health care provider says that it is safe.  Receiving anticoagulants to prevent blood clots. If you cannot take anticoagulants, other options may be available, such as wearing compression stockings or using different types of devices.  Get help right away if:  You have new or increased pain, swelling, or redness in an arm or leg.  You have numbness or tingling in an arm or leg.  You have shortness of breath while active or at rest.  You have chest pain.  You have a rapid or irregular heartbeat.  You feel light-headed or dizzy.  You cough up blood.  You notice blood in your vomit, bowel movement, or  urine.  You have a fever. These symptoms may represent a serious problem that is an emergency. Do not wait to see if the symptoms will go away. Get medical help right away. Call your local emergency services (911 in the [place] Do not drive yourself to the hospital. This information is not intended to replace advice given to you by your health care provider. Make sure you discuss any questions you have with your health care provider. Document Released: 05/02/2000 Document Revised: 10/11/2015 Document Reviewed: 08/30/2014 [employer] Interactive Patient Education  2017 [employer] Deep Vein Thrombosis A deep vein thrombosis (DVT) is a blood clot (thrombus) that usually occurs in a deep, larger vein of the lower leg or the pelvis, or in an upper extremity such as the arm. These are dangerous and can lead to serious and even life-threatening complications if the clot travels to the lungs. A DVT can damage the valves in your leg veins so that instead of flowing upward, the blood pools in the lower leg. This is called  post-thrombotic syndrome, and it can result in pain, swelling, discoloration, and sores on the leg. What are the causes? A DVT is caused by the formation of a blood clot in your leg, pelvis, or arm. Usually, several things contribute to the formation of blood clots. A clot may develop when:  Your blood flow slows down.  Your vein becomes damaged in some way.  You have a condition that makes your blood clot more easily.  What increases the risk? A DVT is more likely to develop in:  People who are older, especially over 81 years of age.  People who are overweight (obese).  People who sit or lie still for a long time, such as during long-distance travel (over 4 hours), bed rest, hospitalization, or during recovery from certain medical conditions like a stroke.  People who do not engage in much physical activity (sedentary lifestyle).  People who have chronic breathing disorders.  People who have a personal or family history of blood clots or blood clotting disease.  People who have peripheral vascular disease (PVD), diabetes, or some types of cancer.  People who have heart disease, especially if the person had a recent heart attack or has congestive heart failure.  People who have neurological diseases that affect the legs (leg paresis).  People who have had a traumatic injury, such as breaking a hip or leg.  People who have recently had major or lengthy surgery, especially on the hip, knee, or abdomen.  People who have had a central line placed inside a large vein.  People who take medicines that contain the hormone estrogen. These include birth control pills and hormone replacement therapy.  Pregnancy or during childbirth or the postpartum period.  Long plane flights (over 8 hours).  What are the signs or symptoms?  Symptoms of a DVT can include:  Swelling of your leg or arm, especially if one side is much worse.  Warmth and redness of your leg or arm, especially if  one side is much worse.  Pain in your arm or leg. If the clot is in your leg, symptoms may be more noticeable or worse when you stand or walk.  A feeling of pins and needles, if the clot is in the arm.  The symptoms of a DVT that has traveled to the lungs (pulmonary embolism, PE) usually start suddenly and include:  Shortness of breath while active or at rest.  Coughing or coughing up blood or blood-tinged  mucus.  Chest pain that is often worse with deep breaths.  Rapid or irregular heartbeat.  Feeling light-headed or dizzy.  Fainting.  Feeling anxious.  Sweating.  There may also be pain and swelling in a leg if that is where the blood clot started. These symptoms may represent a serious problem that is an emergency. Do not wait to see if the symptoms will go away. Get medical help right away. Call your local emergency services (911 in the [place] Do not drive yourself to the hospital. How is this diagnosed? Your health care provider will take a medical history and perform a physical exam. You may also have other tests, including:  Blood tests to assess the clotting properties of your blood.  Imaging tests, such as CT, ultrasound, MRI, X-ray, and other tests to see if you have clots anywhere in your body.  How is this treated? After a DVT is identified, it can be treated. The type of treatment that you receive depends on many factors, such as the cause of your DVT, your risk for bleeding or developing more clots, and other medical conditions that you have. Sometimes, a combination of treatments is necessary. Treatment options may be combined and include:  Monitoring the blood clot with ultrasound.  Taking medicines by mouth, such as newer blood thinners (anticoagulants), thrombolytics, or warfarin.  Taking anticoagulant medicine by injection or through an IV tube.  Wearing compression stockings or using different types ofdevices.  Surgery (rare) to remove the blood clot or  to place a filter in your abdomen to stop the blood clot from traveling to your lungs.  Treatments for a DVT are often divided into immediate treatment and long-term treatment (up to 3 months after DVT). You can work with your health care provider to choose the treatment program that is best for you. Follow these instructions at home: If you are taking a newer oral anticoagulant:  Take the medicine every single day at the same time each day.  Understand what foods and drugs interact with this medicine.  Understand that there are no regular blood tests required when using this medicine.  Understand the side effects of this medicine, including excessive bruising or bleeding. Ask your health care provider or pharmacist about other possible side effects. If you are taking warfarin:  Understand how to take warfarin and know which foods can affect how warfarin works in Public relations account executive.  Understand that it is dangerous to take too much or too little warfarin. Too much warfarin increases the risk of bleeding. Too little warfarin continues to allow the risk for blood clots.  Follow your PT and INR blood testing schedule. The PT and INR results allow your health care provider to adjust your dose of warfarin. It is very important that you have your PT and INR tested as often as told by your health care provider.  Avoid major changes in your diet, or tell your health care provider before you change your diet. Arrange a visit with a registered dietitian to answer your questions. Many foods, especially foods that are high in vitamin K, can interfere with warfarin and affect the PT and INR results. Eat a consistent amount of foods that are high in vitamin K, such as: ? Spinach, kale, broccoli, cabbage, collard greens, turnip greens, Brussels sprouts, peas, cauliflower, seaweed, and parsley. ? Beef liver and pork liver. ? Green tea. ? Soybean oil.  Tell your health care provider about any and all medicines,  vitamins, and supplements that  you take, including aspirin and other over-the-counter anti-inflammatory medicines. Be especially cautious with aspirin and anti-inflammatory medicines. Do not take those before you ask your health care provider if it is safe to do so. This is important because many medicines can interfere with warfarin and affect the PT and INR results.  Do not start or stop taking any over-the-counter or prescription medicine unless your health care provider or pharmacist tells you to do so. If you take warfarin, you will also need to do these things:  Hold pressure over cuts for longer than usual.  Tell your dentist and other health care providers that you are taking warfarin before you have any procedures in which bleeding may occur.  Avoid alcohol or drink very small amounts. Tell your health care provider if you change your alcohol intake.  Do not use tobacco products, including cigarettes, chewing tobacco, and e-cigarettes. If you need help quitting, ask your health care provider.  Avoid contact sports.  General instructions  Take over-the-counter and prescription medicines only as told by your health care provider. Anticoagulant medicines can have side effects, including easy bruising and difficulty stopping bleeding. If you are prescribed an anticoagulant, you will also need to do these things: ? Hold pressure over cuts for longer than usual. ? Tell your dentist and other health care providers that you are taking anticoagulants before you have any procedures in which bleeding may occur. ? Avoid contact sports.  Wear a medical alert bracelet or carry a medical alert card that says you have had a PE.  Ask your health care provider how soon you can go back to your normal activities. Stay active to prevent new blood clots from forming.  Make sure to exercise while traveling or when you have been sitting or standing for a long period of time. It is very important to  exercise. Exercise your legs by walking or by tightening and relaxing your leg muscles often. Take frequent walks.  Wear compression stockings as told by your health care provider to help prevent more blood clots from forming.  Do not use tobacco products, including cigarettes, chewing tobacco, and e-cigarettes. If you need help quitting, ask your health care provider.  Keep all follow-up appointments with your health care provider. This is important. How is this prevented? Take these actions to decrease your risk of developing another DVT:  Exercise regularly. For at least 30 minutes every day, engage in: ? Activity that involves moving your arms and legs. ? Activity that encourages good blood flow through your body by increasing your heart rate.  Exercise your arms and legs every hour during long-distance travel (over 4 hours). Drink plenty of water and avoid drinking alcohol while traveling.  Avoid sitting or lying in bed for long periods of time without moving your legs.  Maintain a weight that is appropriate for your height. Ask your health care provider what weight is healthy for you.  If you are a woman who is over 81 years of age, avoid unnecessary use of medicines that contain estrogen. These include birth control pills.  Do not smoke, especially if you take estrogen medicines. If you need help quitting, ask your health care provider.  If you are hospitalized, prevention measures may include:  Early walking after surgery, as soon as your health care provider says that it is safe.  Receiving anticoagulants to prevent blood clots.If you cannot take anticoagulants, other options may be available, such as wearing compression stockings or using different types of  devices.  Get help right away if:  You have new or increased pain, swelling, or redness in an arm or leg.  You have numbness or tingling in an arm or leg.  You have shortness of breath while active or at rest.  You  have chest pain.  You have a rapid or irregular heartbeat.  You feel light-headed or dizzy.  You cough up blood.  You notice blood in your vomit, bowel movement, or urine. These symptoms may represent a serious problem that is an emergency. Do not wait to see if the symptoms will go away. Get medical help right away. Call your local emergency services (911 in the [place] Do not drive yourself to the hospital. This information is not intended to replace advice given to you by your health care provider. Make sure you discuss any questions you have with your health care provider. Document Released: 05/05/2005 Document Revised: 10/11/2015 Document Reviewed: 08/30/2014 [employer] Interactive Patient Education  2017 [employer]

## 2017-05-05 ENCOUNTER — Other Ambulatory Visit: Payer: Self-pay

## 2017-05-05 ENCOUNTER — Encounter: Payer: Medicare Other | Attending: Internal Medicine

## 2017-05-05 VITALS — Ht 59.5 in | Wt 100.5 lb

## 2017-05-05 DIAGNOSIS — J8410 Pulmonary fibrosis, unspecified: Secondary | ICD-10-CM

## 2017-05-05 DIAGNOSIS — Z7901 Long term (current) use of anticoagulants: Secondary | ICD-10-CM | POA: Diagnosis not present

## 2017-05-05 DIAGNOSIS — K219 Gastro-esophageal reflux disease without esophagitis: Secondary | ICD-10-CM | POA: Diagnosis not present

## 2017-05-05 DIAGNOSIS — E039 Hypothyroidism, unspecified: Secondary | ICD-10-CM | POA: Diagnosis not present

## 2017-05-05 DIAGNOSIS — Z79899 Other long term (current) drug therapy: Secondary | ICD-10-CM | POA: Insufficient documentation

## 2017-05-05 DIAGNOSIS — Z79890 Hormone replacement therapy (postmenopausal): Secondary | ICD-10-CM | POA: Diagnosis not present

## 2017-05-05 NOTE — Patient Instructions (Signed)
Patient Instructions  Patient Details  Name: Sherri Price MRN: [id number] Date of Birth: September 12, 1933 Referring Provider:  Lynnea FerrierKlein, Bert J III, MD  Below are the personal goals you chose as well as exercise and nutrition goals. Our goal is to help you keep on track towards obtaining and maintaining your goals. We will be discussing your progress on these goals with you throughout the program.  Initial Exercise Prescription: Initial Exercise Prescription - 05/05/17 1200      Date of Initial Exercise RX and Referring Provider   Date  05/05/17    Referring Provider  Graciela HusbandsKlein      Oxygen   Oxygen  Intermittent at home - will use continuous exercise    Liters  3      Treadmill   MPH  1.5    Grade  0    Minutes  15    METs  2.15      Recumbant Bike   Level  1    RPM  60    Watts  7    Minutes  15    METs  2.15      NuStep   Level  3    SPM  80    Minutes  15    METs  2.15      Prescription Details   Frequency (times per week)  3    Duration  Progress to 45 minutes of aerobic exercise without signs/symptoms of physical distress      Intensity   THRR 40-80% of Max Heartrate  104-127    Ratings of Perceived Exertion  11-13    Perceived Dyspnea  0-4      Resistance Training   Training Prescription  Yes    Weight  2 lb    Reps  10-15       Exercise Goals: Frequency: Be able to perform aerobic exercise three times per week working toward 3-5 days per week.  Intensity: Work with a perceived exertion of 11 (fairly light) - 15 (hard) as tolerated. Follow your new exercise prescription and watch for changes in prescription as you progress with the program. Changes will be reviewed with you when they are made.  Duration: You should be able to do 30 minutes of continuous aerobic exercise in addition to a 5 minute warm-up and a 5 minute cool-down routine.  Nutrition Goals: Your personal nutrition goals will be established when you do your nutrition analysis with the  dietician.  The following are nutrition guidelines to follow: Cholesterol < 200mg /day Sodium < 1500mg /day Fiber: Women over 50 yrs - 21 grams per day  Personal Goals: Personal Goals and Risk Factors at Admission - 05/05/17 1146      Core Components/Risk Factors/Patient Goals on Admission    Weight Management  Weight Maintenance;Yes    Intervention  Weight Management: Develop a combined nutrition and exercise program designed to reach desired caloric intake, while maintaining appropriate intake of nutrient and fiber, sodium and fats, and appropriate energy expenditure required for the weight goal.;Weight Management/Obesity: Establish reasonable short term and long term weight goals.;Weight Management: Provide education and appropriate resources to help participant work on and attain dietary goals.    Admit Weight  100 lb 8 oz (45.6 kg)    Goal Weight: Short Term  100 lb (45.4 kg)    Goal Weight: Long Term  100 lb (45.4 kg)    Expected Outcomes  Short Term: Continue to assess and modify interventions until short term weight  is achieved;Long Term: Adherence to nutrition and physical activity/exercise program aimed toward attainment of established weight goal;Weight Maintenance: Understanding of the daily nutrition guidelines, which includes 25-35% calories from fat, 7% or less cal from saturated fats, less than 200mg  cholesterol, less than 1.5gm of sodium, & 5 or more servings of fruits and vegetables daily    Improve shortness of breath with ADL's  Yes    Intervention  Provide education, individualized exercise plan and daily activity instruction to help decrease symptoms of SOB with activities of daily living.    Expected Outcomes  Short Term: Achieves a reduction of symptoms when performing activities of daily living.       Tobacco Use Initial Evaluation: Social History   Tobacco Use  Smoking Status Never Smoker  Smokeless Tobacco Never Used    Exercise Goals and Review: Exercise  Goals    Row Name 05/05/17 1255             Exercise Goals   Increase Physical Activity  Yes       Intervention  Provide advice, education, support and counseling about physical activity/exercise needs.;Develop an individualized exercise prescription for aerobic and resistive training based on initial evaluation findings, risk stratification, comorbidities and participant's personal goals.       Expected Outcomes  Achievement of increased cardiorespiratory fitness and enhanced flexibility, muscular endurance and strength shown through measurements of functional capacity and personal statement of participant.       Increase Strength and Stamina  Yes       Intervention  Provide advice, education, support and counseling about physical activity/exercise needs.;Develop an individualized exercise prescription for aerobic and resistive training based on initial evaluation findings, risk stratification, comorbidities and participant's personal goals.       Expected Outcomes  Achievement of increased cardiorespiratory fitness and enhanced flexibility, muscular endurance and strength shown through measurements of functional capacity and personal statement of participant.       Able to understand and use rate of perceived exertion (RPE) scale  Yes       Intervention  Provide education and explanation on how to use RPE scale       Expected Outcomes  Short Term: Able to use RPE daily in rehab to express subjective intensity level;Long Term:  Able to use RPE to guide intensity level when exercising independently       Able to understand and use Dyspnea scale  Yes       Intervention  Provide education and explanation on how to use Dyspnea scale       Expected Outcomes  Short Term: Able to use Dyspnea scale daily in rehab to express subjective sense of shortness of breath during exertion;Long Term: Able to use Dyspnea scale to guide intensity level when exercising independently       Knowledge and understanding  of Target Heart Rate Range (THRR)  Yes       Intervention  Provide education and explanation of THRR including how the numbers were predicted and where they are located for reference       Expected Outcomes  Short Term: Able to state/look up THRR;Long Term: Able to use THRR to govern intensity when exercising independently;Short Term: Able to use daily as guideline for intensity in rehab       Able to check pulse independently  Yes       Intervention  Provide education and demonstration on how to check pulse in carotid and radial arteries.;Review the importance of being able  to check your own pulse for safety during independent exercise       Expected Outcomes  Short Term: Able to explain why pulse checking is important during independent exercise;Long Term: Able to check pulse independently and accurately       Understanding of Exercise Prescription  Yes       Intervention  Provide education, explanation, and written materials on patient's individual exercise prescription       Expected Outcomes  Short Term: Able to explain program exercise prescription;Long Term: Able to explain home exercise prescription to exercise independently          Copy of goals given to participant.

## 2017-05-05 NOTE — Progress Notes (Signed)
Pulmonary Individual Treatment Plan  Patient Details  Name: TANARA TURVEY MRN: [id number] Date of Birth: 1934/01/03 Referring Provider:     Pulmonary Rehab from 05/05/2017 in [facility] Cardiac and Pulmonary Rehab  Referring Provider  Caryl Comes      Initial Encounter Date:    Pulmonary Rehab from 05/05/2017 in [facility] Cardiac and Pulmonary Rehab  Date  05/05/17  Referring Provider  Caryl Comes      Visit Diagnosis: Pulmonary fibrosis ([place])  Patient's Home Medications on Admission:  Current Outpatient Medications:  .  Ascorbic Acid (VITAMIN C) 1000 MG tablet, Take 1,000 mg by mouth 2 (two) times daily., Disp: , Rfl:  .  Cholecalciferol (VITAMIN D3) 5000 UNITS CAPS, Take 1 capsule by mouth daily., Disp: , Rfl:  .  Coenzyme Q10 100 MG capsule, Take 100 mg by mouth daily. , Disp: , Rfl:  .  Cranberry Fruit 405 MG CAPS, Take 1 capsule by mouth 2 (two) times daily. , Disp: , Rfl:  .  Evening Primrose Oil 500 MG CAPS, Take 1 capsule by mouth 2 (two) times daily. , Disp: , Rfl:  .  FLUOCINOLONE ACETONIDE SCALP 4.27 % OIL, 1 application by Other route daily as needed (psoriasis on scalp). , Disp: , Rfl:  .  L-Lysine 1000 MG TABS, Take 1 tablet by mouth 2 (two) times daily. , Disp: , Rfl:  .  levothyroxine (LEVOXYL) 100 MCG tablet, Take 100 mcg by mouth daily before breakfast. , Disp: , Rfl:  .  Multiple Vitamin (MULTIVITAMIN WITH MINERALS) TABS tablet, Take 1 tablet by mouth daily., Disp: , Rfl:  .  NON FORMULARY, Take 1 tablet by mouth 2 (two) times daily. calcium hydroxyapatite, Disp: , Rfl:  .  NON FORMULARY, Take 1 capsule by mouth 2 (two) times daily. With lunch and dinner. lactate prophase papain bromein, Disp: , Rfl:  .  NON FORMULARY, Take 1 tablet by mouth 2 (two) times daily. Quercetine, Disp: , Rfl:  .  NON FORMULARY, Take 1 Dose by mouth daily. Mixes these 3 OTC triple fiber, fiber smart, psyllium in a cup of juice every morning, Disp: , Rfl:  .  NON FORMULARY, Take 1 capsule by mouth 2 (two)  times daily. Eye health with lutein, Disp: , Rfl:  .  omeprazole (PRILOSEC) 20 MG capsule, Take 20 mg by mouth daily. , Disp: , Rfl:  .  Probiotic Product (PROBIOTIC DAILY PO), Take 2 tablets by mouth at bedtime., Disp: , Rfl:  .  Red Yeast Rice Extract 600 MG CAPS, Take 1 capsule by mouth 2 (two) times daily. , Disp: , Rfl:  .  rivaroxaban (XARELTO) 20 MG TABS tablet, Take 20 mg by mouth 2 (two) times daily at 10 AM and 5 PM., Disp: , Rfl:  .  TURMERIC CURCUMIN PO, Take 1 capsule by mouth daily. Use. One bid, Disp: , Rfl:  .  vitamin E 400 UNIT capsule, Take 400 Units by mouth 2 (two) times daily. , Disp: , Rfl:   Past Medical History: Past Medical History:  Diagnosis Date  . Arthritis   . Bell's palsy   . Cough    CHRONIC  . GERD (gastroesophageal reflux disease)   . Hypothyroidism   . IBS (irritable bowel syndrome)   . Neuropathy   . Oxygen decrease    USES AT NIGHT  . Pulmonary fibrosis ([place])   . Shortness of breath dyspnea     Tobacco Use: Social History   Tobacco Use  Smoking Status Never Smoker  Smokeless Tobacco Never Used    Labs: Recent Review Flowsheet Data    There is no flowsheet data to display.       Pulmonary Assessment Scores: Pulmonary Assessment Scores    Row Name 05/05/17 1133         ADL UCSD   ADL Phase  Entry     SOB Score total  55     Rest  0     Walk  3     Stairs  4     Bath  2     Dress  1     Shop  2       CAT Score   CAT Score  18       mMRC Score   mMRC Score  2        Pulmonary Function Assessment: Pulmonary Function Assessment - 05/05/17 1222      Breath   Bilateral Breath Sounds  Clear    Shortness of Breath  Fear of Shortness of Breath;Panic with Shortness of Breath;Limiting activity;Yes       Exercise Target Goals: Date: 05/05/17  Exercise Program Goal: Individual exercise prescription set with THRR, safety & activity barriers. Participant demonstrates ability to understand and report RPE using BORG  scale, to self-measure pulse accurately, and to acknowledge the importance of the exercise prescription.  Exercise Prescription Goal: Starting with aerobic activity 30 plus minutes a day, 3 days per week for initial exercise prescription. Provide home exercise prescription and guidelines that participant acknowledges understanding prior to discharge.  Activity Barriers & Risk Stratification:   6 Minute Walk: 6 Minute Walk    Row Name 05/05/17 1256         6 Minute Walk   Distance  880 feet     Walk Time  6 minutes     # of Rest Breaks  0     MPH  1.67     METS  1.51     RPE  12     Perceived Dyspnea   2     VO2 Peak  5.28     Symptoms  No     Resting HR  80 bpm     Resting BP  106/52     Resting Oxygen Saturation   92 %     Exercise Oxygen Saturation  during 6 min walk  91 %     Max Ex. HR  84 bpm     Max Ex. BP  116/54     2 Minute Post BP  96/54       Interval HR   1 Minute HR  81     2 Minute HR  84     3 Minute HR  83     4 Minute HR  82     5 Minute HR  84     6 Minute HR  82     2 Minute Post HR  76     Interval Heart Rate?  Yes       Interval Oxygen   Interval Oxygen?  Yes     Baseline Oxygen Saturation %  92 %     1 Minute Oxygen Saturation %  94 %     1 Minute Liters of Oxygen  3 L pulsed - own tank     2 Minute Oxygen Saturation %  94 %     2 Minute Liters of Oxygen  3 L  3 Minute Oxygen Saturation %  94 %     3 Minute Liters of Oxygen  3 L     4 Minute Oxygen Saturation %  92 %     4 Minute Liters of Oxygen  3 L     5 Minute Oxygen Saturation %  91 %     5 Minute Liters of Oxygen  3 L     6 Minute Oxygen Saturation %  93 %     6 Minute Liters of Oxygen  3 L     2 Minute Post Oxygen Saturation %  96 %     2 Minute Post Liters of Oxygen  3 L       Oxygen Initial Assessment: Oxygen Initial Assessment - 05/05/17 1144      Home Oxygen   Home Oxygen Device  Home Concentrator;E-Tanks    Sleep Oxygen Prescription  Continuous    Liters per  minute  3    Home Exercise Oxygen Prescription  Continuous    Liters per minute  3    Home at Rest Exercise Oxygen Prescription  Continuous    Liters per minute  3    Compliance with Home Oxygen Use  Yes      Initial 6 min Walk   Oxygen Used  Continuous    Liters per minute  3      Program Oxygen Prescription   Program Oxygen Prescription  Continuous    Liters per minute  3      Intervention   Short Term Goals  To learn and exhibit compliance with exercise, home and travel O2 prescription;To learn and understand importance of maintaining oxygen saturations>88%;To learn and understand importance of monitoring SPO2 with pulse oximeter and demonstrate accurate use of the pulse oximeter.;To learn and demonstrate proper pursed lip breathing techniques or other breathing techniques.;To learn and demonstrate proper use of respiratory medications    Long  Term Goals  Exhibits compliance with exercise, home and travel O2 prescription;Verbalizes importance of monitoring SPO2 with pulse oximeter and return demonstration;Maintenance of O2 saturations>88%;Exhibits proper breathing techniques, such as pursed lip breathing or other method taught during program session;Compliance with respiratory medication;Demonstrates proper use of MDI's       Oxygen Re-Evaluation:   Oxygen Discharge (Final Oxygen Re-Evaluation):   Initial Exercise Prescription: Initial Exercise Prescription - 05/05/17 1200      Date of Initial Exercise RX and Referring Provider   Date  05/05/17    Referring Provider  Graciela HusbandsKlein      Oxygen   Oxygen  Intermittent at home - will use continuous exercise    Liters  3      Treadmill   MPH  1.5    Grade  0    Minutes  15    METs  2.15      Recumbant Bike   Level  1    RPM  60    Watts  7    Minutes  15    METs  2.15      NuStep   Level  3    SPM  80    Minutes  15    METs  2.15      Prescription Details   Frequency (times per week)  3    Duration  Progress to 45  minutes of aerobic exercise without signs/symptoms of physical distress      Intensity   THRR 40-80% of Max Heartrate  104-127    Ratings  of Perceived Exertion  11-13    Perceived Dyspnea  0-4      Resistance Training   Training Prescription  Yes    Weight  2 lb    Reps  10-15       Perform Capillary Blood Glucose checks as needed.  Exercise Prescription Changes:   Exercise Comments:   Exercise Goals and Review: Exercise Goals    Row Name 05/05/17 1255             Exercise Goals   Increase Physical Activity  Yes       Intervention  Provide advice, education, support and counseling about physical activity/exercise needs.;Develop an individualized exercise prescription for aerobic and resistive training based on initial evaluation findings, risk stratification, comorbidities and participant's personal goals.       Expected Outcomes  Achievement of increased cardiorespiratory fitness and enhanced flexibility, muscular endurance and strength shown through measurements of functional capacity and personal statement of participant.       Increase Strength and Stamina  Yes       Intervention  Provide advice, education, support and counseling about physical activity/exercise needs.;Develop an individualized exercise prescription for aerobic and resistive training based on initial evaluation findings, risk stratification, comorbidities and participant's personal goals.       Expected Outcomes  Achievement of increased cardiorespiratory fitness and enhanced flexibility, muscular endurance and strength shown through measurements of functional capacity and personal statement of participant.       Able to understand and use rate of perceived exertion (RPE) scale  Yes       Intervention  Provide education and explanation on how to use RPE scale       Expected Outcomes  Short Term: Able to use RPE daily in rehab to express subjective intensity level;Long Term:  Able to use RPE to guide  intensity level when exercising independently       Able to understand and use Dyspnea scale  Yes       Intervention  Provide education and explanation on how to use Dyspnea scale       Expected Outcomes  Short Term: Able to use Dyspnea scale daily in rehab to express subjective sense of shortness of breath during exertion;Long Term: Able to use Dyspnea scale to guide intensity level when exercising independently       Knowledge and understanding of Target Heart Rate Range (THRR)  Yes       Intervention  Provide education and explanation of THRR including how the numbers were predicted and where they are located for reference       Expected Outcomes  Short Term: Able to state/look up THRR;Long Term: Able to use THRR to govern intensity when exercising independently;Short Term: Able to use daily as guideline for intensity in rehab       Able to check pulse independently  Yes       Intervention  Provide education and demonstration on how to check pulse in carotid and radial arteries.;Review the importance of being able to check your own pulse for safety during independent exercise       Expected Outcomes  Short Term: Able to explain why pulse checking is important during independent exercise;Long Term: Able to check pulse independently and accurately       Understanding of Exercise Prescription  Yes       Intervention  Provide education, explanation, and written materials on patient's individual exercise prescription       Expected Outcomes  Short Term: Able to explain program exercise prescription;Long Term: Able to explain home exercise prescription to exercise independently          Exercise Goals Re-Evaluation :   Discharge Exercise Prescription (Final Exercise Prescription Changes):   Nutrition:  Target Goals: Understanding of nutrition guidelines, daily intake of sodium 1500mg , cholesterol 200mg , calories 30% from fat and 7% or less from saturated fats, daily to have 5 or more servings  of fruits and vegetables.  Biometrics: Pre Biometrics - 05/05/17 1255      Pre Biometrics   Height  4' 11.5" (1.511 m)    Weight  100 lb 8 oz (45.6 kg)    Waist Circumference  29 inches    Hip Circumference  35.75 inches    Waist to Hip Ratio  0.81 %    BMI (Calculated)  19.97        Nutrition Therapy Plan and Nutrition Goals: Nutrition Therapy & Goals - 05/05/17 1143      Nutrition Therapy   RD appointment defered  Yes      Personal Nutrition Goals   Comments  She declines the dietician appointment      Intervention Plan   Intervention  Prescribe, educate and counsel regarding individualized specific dietary modifications aiming towards targeted core components such as weight, hypertension, lipid management, diabetes, heart failure and other comorbidities.    Expected Outcomes  Short Term Goal: Understand basic principles of dietary content, such as calories, fat, sodium, cholesterol and nutrients.;Long Term Goal: Adherence to prescribed nutrition plan.       Nutrition Discharge: Rate Your Plate Scores:   Nutrition Goals Re-Evaluation:   Nutrition Goals Discharge (Final Nutrition Goals Re-Evaluation):   Psychosocial: Target Goals: Acknowledge presence or absence of significant depression and/or stress, maximize coping skills, provide positive support system. Participant is able to verbalize types and ability to use techniques and skills needed for reducing stress and depression.   Initial Review & Psychosocial Screening: Initial Psych Review & Screening - 05/05/17 1141      Initial Review   Current issues with  Current Sleep Concerns    Source of Stress Concerns  Chronic Illness      Family Dynamics   Good Support System?  Yes    Comments  Her husband is a good support system. Most of her family is in [place].      Barriers   Psychosocial barriers to participate in program  The patient should benefit from training in stress management and relaxation.       Screening Interventions   Interventions  Yes;Encouraged to exercise;Program counselor consult;Provide feedback about the scores to participant;To provide support and resources with identified psychosocial needs    Expected Outcomes  Short Term goal: Identification and review with participant of any Quality of Life or Depression concerns found by scoring the questionnaire.;Long Term goal: The participant improves quality of Life and PHQ9 Scores as seen by post scores and/or verbalization of changes;Long Term Goal: Stressors or current issues are controlled or eliminated.;Short Term goal: Utilizing psychosocial counselor, staff and physician to assist with identification of specific Stressors or current issues interfering with healing process. Setting desired goal for each stressor or current issue identified.       Quality of Life Scores:   PHQ-9: Recent Review Flowsheet Data    Depression screen [facility] 2/9 05/05/2017 04/30/2017   Decreased Interest 3 0   Down, Depressed, Hopeless 0 0   PHQ - 2 Score 3 0   Altered  sleeping 3 -   Tired, decreased energy 3 -   Change in appetite 0 -   Feeling bad or failure about yourself  0 -   Trouble concentrating 0 -   Moving slowly or fidgety/restless 0 -   Suicidal thoughts 0 -   PHQ-9 Score 9 -   Difficult doing work/chores Very difficult -     Interpretation of Total Score  Total Score Depression Severity:  1-4 = Minimal depression, 5-9 = Mild depression, 10-14 = Moderate depression, 15-19 = Moderately severe depression, 20-27 = Severe depression   Psychosocial Evaluation and Intervention:   Psychosocial Re-Evaluation:   Psychosocial Discharge (Final Psychosocial Re-Evaluation):   Education: Education Goals: Education classes will be provided on a weekly basis, covering required topics. Participant will state understanding/return demonstration of topics presented.  Learning Barriers/Preferences: Learning Barriers/Preferences - 05/05/17  1144      Learning Barriers/Preferences   Learning Barriers  Sight wears glasses    Learning Preferences  None       Education Topics: Initial Evaluation Education: - Verbal, written and demonstration of respiratory meds, RPE/PD scales, oximetry and breathing techniques. Instruction on use of nebulizers and MDIs: cleaning and proper use, rinsing mouth with steroid doses and importance of monitoring MDI activations.   Pulmonary Rehab from 05/05/2017 in [facility] Cardiac and Pulmonary Rehab  Date  05/05/17  Educator  [facility]  Instruction Review Code  1- Verbalizes Understanding      General Nutrition Guidelines/Fats and Fiber: -Group instruction provided by verbal, written material, models and posters to present the general guidelines for heart healthy nutrition. Gives an explanation and review of dietary fats and fiber.   Controlling Sodium/Reading Food Labels: -Group verbal and written material supporting the discussion of sodium use in heart healthy nutrition. Review and explanation with models, verbal and written materials for utilization of the food label.   Exercise Physiology & Risk Factors: - Group verbal and written instruction with models to review the exercise physiology of the cardiovascular system and associated critical values. Details cardiovascular disease risk factors and the goals associated with each risk factor.   Aerobic Exercise & Resistance Training: - Gives group verbal and written discussion on the health impact of inactivity. On the components of aerobic and resistive training programs and the benefits of this training and how to safely progress through these programs.   Flexibility, Balance, General Exercise Guidelines: - Provides group verbal and written instruction on the benefits of flexibility and balance training programs. Provides general exercise guidelines with specific guidelines to those with heart or lung disease. Demonstration and skill practice  provided.   Stress Management: - Provides group verbal and written instruction about the health risks of elevated stress, cause of high stress, and healthy ways to reduce stress.   Depression: - Provides group verbal and written instruction on the correlation between heart/lung disease and depressed mood, treatment options, and the stigmas associated with seeking treatment.   Exercise & Equipment Safety: - Individual verbal instruction and demonstration of equipment use and safety with use of the equipment.   Pulmonary Rehab from 05/05/2017 in [facility] Cardiac and Pulmonary Rehab  Date  05/05/17  Educator  [facility]  Instruction Review Code  1- Verbalizes Understanding      Infection Prevention: - Provides verbal and written material to individual with discussion of infection control including proper hand washing and proper equipment cleaning during exercise session.   Pulmonary Rehab from 05/05/2017 in [facility] Cardiac and Pulmonary Rehab  Date  05/05/17  Educator  [facility]  Instruction Review Code  1- Verbalizes Understanding      Falls Prevention: - Provides verbal and written material to individual with discussion of falls prevention and safety.   Pulmonary Rehab from 05/05/2017 in [facility] Cardiac and Pulmonary Rehab  Date  05/05/17  Educator  [facility]  Instruction Review Code  1- Verbalizes Understanding      Diabetes: - Individual verbal and written instruction to review signs/symptoms of diabetes, desired ranges of glucose level fasting, after meals and with exercise. Advice that pre and post exercise glucose checks will be done for 3 sessions at entry of program.   Chronic Lung Diseases: - Group verbal and written instruction to review new updates, new respiratory medications, new advancements in procedures and treatments. Provide informative websites and "800" numbers of self-education.   Lung Procedures: - Group verbal and written instruction to describe testing methods done to diagnose  lung disease. Review the outcome of test results. Describe the treatment choices: Pulmonary Function Tests, ABGs and oximetry.   Energy Conservation: - Provide group verbal and written instruction for methods to conserve energy, plan and organize activities. Instruct on pacing techniques, use of adaptive equipment and posture/positioning to relieve shortness of breath.   Triggers: - Group verbal and written instruction to review types of environmental controls: home humidity, furnaces, filters, dust mite/pet prevention, HEPA vacuums. To discuss weather changes, air quality and the benefits of nasal washing.   Exacerbations: - Group verbal and written instruction to provide: warning signs, infection symptoms, calling MD promptly, preventive modes, and value of vaccinations. Review: effective airway clearance, coughing and/or vibration techniques. Create an Sport and exercise psychologist.   Oxygen: - Individual and group verbal and written instruction on oxygen therapy. Includes supplement oxygen, available portable oxygen systems, continuous and intermittent flow rates, oxygen safety, concentrators, and Medicare reimbursement for oxygen.   Pulmonary Rehab from 05/05/2017 in [facility] Cardiac and Pulmonary Rehab  Date  05/05/17  Educator  [facility]  Instruction Review Code  1- Verbalizes Understanding      Respiratory Medications: - Group verbal and written instruction to review medications for lung disease. Drug class, frequency, complications, importance of spacers, rinsing mouth after steroid MDI's, and proper cleaning methods for nebulizers.   AED/CPR: - Group verbal and written instruction with the use of models to demonstrate the basic use of the AED with the basic ABC's of resuscitation.   Breathing Retraining: - Provides individuals verbal and written instruction on purpose, frequency, and proper technique of diaphragmatic breathing and pursed-lipped breathing. Applies individual practice skills.    Pulmonary Rehab from 05/05/2017 in [facility] Cardiac and Pulmonary Rehab  Date  05/05/17  Educator  [facility]  Instruction Review Code  1- Verbalizes Understanding      Anatomy and Physiology of the Lungs: - Group verbal and written instruction with the use of models to provide basic lung anatomy and physiology related to function, structure and complications of lung disease.   Anatomy & Physiology of the Heart: - Group verbal and written instruction and models provide basic cardiac anatomy and physiology, with the coronary electrical and arterial systems. Review of: AMI, Angina, Valve disease, Heart Failure, Cardiac Arrhythmia, Pacemakers, and the ICD.   Heart Failure: - Group verbal and written instruction on the basics of heart failure: signs/symptoms, treatments, explanation of ejection fraction, enlarged heart and cardiomyopathy.   Sleep Apnea: - Individual verbal and written instruction to review Obstructive Sleep Apnea. Review of risk factors, methods for diagnosing and types of masks and machines  for OSA.   Anxiety: - Provides group, verbal and written instruction on the correlation between heart/lung disease and anxiety, treatment options, and management of anxiety.   Relaxation: - Provides group, verbal and written instruction about the benefits of relaxation for patients with heart/lung disease. Also provides patients with examples of relaxation techniques.   Cardiac Medications: - Group verbal and written instruction to review commonly prescribed medications for heart disease. Reviews the medication, class of the drug, and side effects.   Know Your Numbers: -Group verbal and written instruction about important numbers in your health.  Review of Cholesterol, Blood Pressure, Diabetes, and BMI and the role they play in your overall health.   Other: -Provides group and verbal instruction on various topics (see comments)    Knowledge Questionnaire Score: Knowledge  Questionnaire Score - 05/05/17 1152      Knowledge Questionnaire Score   Pre Score  16/18 reviewd with patient.        Core Components/Risk Factors/Patient Goals at Admission: Personal Goals and Risk Factors at Admission - 05/05/17 1146      Core Components/Risk Factors/Patient Goals on Admission    Weight Management  Weight Maintenance;Yes    Intervention  Weight Management: Develop a combined nutrition and exercise program designed to reach desired caloric intake, while maintaining appropriate intake of nutrient and fiber, sodium and fats, and appropriate energy expenditure required for the weight goal.;Weight Management/Obesity: Establish reasonable short term and long term weight goals.;Weight Management: Provide education and appropriate resources to help participant work on and attain dietary goals.    Admit Weight  100 lb 8 oz (45.6 kg)    Goal Weight: Short Term  100 lb (45.4 kg)    Goal Weight: Long Term  100 lb (45.4 kg)    Expected Outcomes  Short Term: Continue to assess and modify interventions until short term weight is achieved;Long Term: Adherence to nutrition and physical activity/exercise program aimed toward attainment of established weight goal;Weight Maintenance: Understanding of the daily nutrition guidelines, which includes 25-35% calories from fat, 7% or less cal from saturated fats, less than 200mg  cholesterol, less than 1.5gm of sodium, & 5 or more servings of fruits and vegetables daily    Improve shortness of breath with ADL's  Yes    Intervention  Provide education, individualized exercise plan and daily activity instruction to help decrease symptoms of SOB with activities of daily living.    Expected Outcomes  Short Term: Achieves a reduction of symptoms when performing activities of daily living.       Core Components/Risk Factors/Patient Goals Review:    Core Components/Risk Factors/Patient Goals at Discharge (Final Review):    ITP Comments: ITP Comments     Row Name 05/05/17 1102           ITP Comments  Medical Evaluation completed. Chart sent for review and changes to Dr. Bethann Punches Director of [facility]. Diagnosis can be found in [facility] encounter 05/05/17          Comments: Initial ITP

## 2017-05-13 ENCOUNTER — Encounter: Payer: Medicare Other | Admitting: *Deleted

## 2017-05-13 DIAGNOSIS — J8410 Pulmonary fibrosis, unspecified: Secondary | ICD-10-CM | POA: Diagnosis not present

## 2017-05-13 NOTE — Progress Notes (Signed)
Daily Session Note  Patient Details  Name: ELHAM FINI MRN: [id number] Date of Birth: 18-Nov-1933 Referring Provider:     Pulmonary Rehab from 05/05/2017 in [facility] Cardiac and Pulmonary Rehab  Referring Provider  Caryl Comes      Encounter Date: 05/13/2017  Check In: Session Check In - 05/13/17 1022      Check-In   Location  [facility]-Cardiac & Pulmonary Rehab    Staff Present  Nada Maclachlan, BA, ACSM CEP, Exercise Physiologist;Other;Dalayza Zambrana Luan Pulling, MA, ACSM RCEP, Exercise Physiologist Joellyn Rued, [place], [place]    Supervising physician immediately available to respond to emergencies  [facility] immediately available ER MD    Physician(s)  Dr. Jimmye Norman and [facility]    Medication changes reported      No    Fall or balance concerns reported     No    Tobacco Cessation  No Change    Warm-up and Cool-down  Performed as group-led instruction    Resistance Training Performed  Yes    VAD Patient?  No      Pain Assessment   Currently in Pain?  No/denies    Multiple Pain Sites  No          Social History   Tobacco Use  Smoking Status Never Smoker  Smokeless Tobacco Never Used    Goals Met:  Exercise tolerated well Personal goals reviewed Queuing for purse lip breathing No report of cardiac concerns or symptoms Strength training completed today  Goals Unmet:  Not Applicable  Comments: First full day of exercise!  Patient was oriented to gym and equipment including functions, settings, policies, and procedures.  Patient's individual exercise prescription and treatment plan were reviewed.  All starting workloads were established based on the results of the 6 minute walk test done at initial orientation visit.  The plan for exercise progression was also introduced and progression will be customized based on patient's performance and goals.    Dr. Emily Filbert is Medical Director for [place] and [facility].

## 2017-05-13 NOTE — Progress Notes (Signed)
Daily Session Note  Patient Details  Name: Sherri Price MRN: [id number] Date of Birth: 11-20-1933 Referring Provider:     Pulmonary Rehab from 05/05/2017 in [facility] Cardiac and Pulmonary Rehab  Referring Provider  Caryl Comes      Encounter Date: 05/13/2017  Check In: Session Check In - 05/13/17 1022      Check-In   Location  [facility]Cardiac [facility] Pulmonary Rehab    Staff Present  Nada Maclachlan, BA, ACSM CEP, Exercise Physiologist;Other;Jessica Luan Pulling, MA, ACSM RCEP, Exercise Physiologist Joellyn Rued, [place], [place]    Supervising physician immediately available to respond to emergencies  [facility] immediately available ER MD    Physician(s)  Dr. Jimmye Norman and [facility]    Medication changes reported      No    Fall or balance concerns reported     No    Tobacco Cessation  No Change    Warm-up and Cool-down  Performed as group-led instruction    Resistance Training Performed  Yes    VAD Patient?  No      Pain Assessment   Currently in Pain?  No/denies    Multiple Pain Sites  No          Social History   Tobacco Use  Smoking Status Never Smoker  Smokeless Tobacco Never Used    Goals Met:  Proper associated with RPD/PD & O2 Sat Improved SOB with ADL's Using PLB without cueing & demonstrates good technique No report of cardiac concerns or symptoms Strength training completed today  Goals Unmet:  Not Applicable  Comments: Reviewed RPE scale, THR and program prescription with pt today.  Pt voiced understanding and was given a copy of goals to take home.    Short: Use RPE daily to regulate intensity.  Long: Follow program prescription in THR.    Dr. Emily Filbert is Medical Director for [place] and [facility].

## 2017-05-15 DIAGNOSIS — J8410 Pulmonary fibrosis, unspecified: Secondary | ICD-10-CM | POA: Diagnosis not present

## 2017-05-15 NOTE — Progress Notes (Signed)
Patient needs to use 6 liters when exercising. Her oxygen on 4 liters was below 85% on the treadmill. Will send note to Dr. Graciela HusbandsKlein for an Oxygen order for 6 liters.

## 2017-05-15 NOTE — Progress Notes (Signed)
Daily Session Note  Patient Details  Name: Sherri Price MRN: [id number] Date of Birth: May 28, 1933 Referring Provider:     Pulmonary Rehab from 05/05/2017 in [facility] Cardiac and Pulmonary Rehab  Referring Provider  Caryl Comes      Encounter Date: 05/15/2017  Check In: Session Check In - 05/15/17 1019      Check-In   Location  [facility]Cardiac [facility] Pulmonary Rehab    Staff Present  Justin Mend Lorre Nick, [place], ACSM RCEP, Exercise Physiologist;Meredith Sherryll Burger, RN BSN    Supervising physician immediately available to respond to emergencies  [facility] immediately available ER MD    Physician(s)  Dr. Burlene Arnt and Cinda Quest    Medication changes reported      No    Fall or balance concerns reported     No    Tobacco Cessation  No Change    Warm-up and Cool-down  Performed as group-led instruction    Resistance Training Performed  Yes    VAD Patient?  No      Pain Assessment   Currently in Pain?  No/denies          Social History   Tobacco Use  Smoking Status Never Smoker  Smokeless Tobacco Never Used    Goals Met:  Exercise tolerated well No report of cardiac concerns or symptoms Strength training completed today  Goals Unmet:  Not Applicable  Comments: Patient needs to use 6 liters when exercising. Her oxygen on 4 liters was below 85% on the treadmill. Will send note to Dr. Lequita Halt for an Oxygen order for 6 liters. Pt able to follow exercise prescription today without complaint.  Will continue to monitor for progression.   Dr. Emily Filbert is Medical Director for [place] and [facility].

## 2017-05-18 ENCOUNTER — Encounter: Payer: Medicare Other | Admitting: *Deleted

## 2017-05-18 ENCOUNTER — Encounter: Payer: Self-pay | Admitting: *Deleted

## 2017-05-18 DIAGNOSIS — J8410 Pulmonary fibrosis, unspecified: Secondary | ICD-10-CM

## 2017-05-18 NOTE — Progress Notes (Signed)
Daily Session Note  Patient Details  Name: Sherri Price MRN: [id number] Date of Birth: 12-01-1933 Referring Provider:     Pulmonary Rehab from 05/05/2017 in [facility] Cardiac and Pulmonary Rehab  Referring Provider  Caryl Comes      Encounter Date: 05/18/2017  Check In: Session Check In - 05/18/17 1019      Check-In   Location  [facility] Pulmonary Rehab    Staff Present  Renita Papa, RN BSN;Jessa Stinson, RN, Levie Heritage, MA, ACSM RCEP, Exercise Physiologist    Supervising physician immediately available to respond to emergencies  [facility] immediately available ER MD    Physician(s)  Dr. Quentin Cornwall and Siadecki     Medication changes reported      No    Fall or balance concerns reported     No    Warm-up and Cool-down  Performed as group-led instruction    Resistance Training Performed  Yes    VAD Patient?  No      Pain Assessment   Currently in Pain?  No/denies          Social History   Tobacco Use  Smoking Status Never Smoker  Smokeless Tobacco Never Used    Goals Met:  Proper associated with RPD/PD & O2 Sat Exercise tolerated well Strength training completed today  Goals Unmet:  Not Applicable  Comments:     Dr. Emily Filbert is Medical Director for [place] and [facility].

## 2017-05-18 NOTE — Progress Notes (Signed)
Daily Session Note  Patient Details  Name: Sherri Price MRN: [id number] Date of Birth: 18-Oct-1933 Referring Provider:     Pulmonary Rehab from 05/05/2017 in [facility] Cardiac and Pulmonary Rehab  Referring Provider  Caryl Comes      Encounter Date: 05/18/2017  Check In: Session Check In - 05/18/17 1019      Check-In   Location  [facility]Card[facility] Pulmonary Rehab    Staff Present  Renita Papa, RN BSN;Carroll Enterkin, RN, Levie Heritage, MA, ACSM RCEP, Exercise Physiologist    Supervising physician immediately available to respond to emergencies  [facility] immediately available ER MD    Physician(s)  Dr. Quentin Cornwall and Siadecki     Medication changes reported      No    Fall or balance concerns reported     No    Warm-up and Cool-down  Performed as group-led instruction    Resistance Training Performed  Yes    VAD Patient?  No      Pain Assessment   Currently in Pain?  No/denies          Social History   Tobacco Use  Smoking Status Never Smoker  Smokeless Tobacco Never Used    Goals Met:  Proper associated with RPD/PD & O2 Sat Independence with exercise equipment Using PLB without cueing & demonstrates good technique Exercise tolerated well Strength training completed today  Goals Unmet:  Not Applicable  Comments: Pt able to follow exercise prescription today without complaint.  Will continue to monitor for progression.    Dr. Emily Filbert is Medical Director for [place] and [facility].

## 2017-05-20 ENCOUNTER — Encounter: Payer: Medicare Other | Attending: Internal Medicine

## 2017-05-20 ENCOUNTER — Other Ambulatory Visit: Payer: Self-pay | Admitting: Internal Medicine

## 2017-05-20 DIAGNOSIS — E039 Hypothyroidism, unspecified: Secondary | ICD-10-CM | POA: Diagnosis not present

## 2017-05-20 DIAGNOSIS — Z79890 Hormone replacement therapy (postmenopausal): Secondary | ICD-10-CM | POA: Diagnosis not present

## 2017-05-20 DIAGNOSIS — Z7901 Long term (current) use of anticoagulants: Secondary | ICD-10-CM | POA: Diagnosis not present

## 2017-05-20 DIAGNOSIS — K219 Gastro-esophageal reflux disease without esophagitis: Secondary | ICD-10-CM | POA: Diagnosis not present

## 2017-05-20 DIAGNOSIS — Z79899 Other long term (current) drug therapy: Secondary | ICD-10-CM | POA: Diagnosis not present

## 2017-05-20 DIAGNOSIS — J8410 Pulmonary fibrosis, unspecified: Secondary | ICD-10-CM | POA: Insufficient documentation

## 2017-05-20 DIAGNOSIS — Z1231 Encounter for screening mammogram for malignant neoplasm of breast: Secondary | ICD-10-CM

## 2017-05-20 NOTE — Progress Notes (Signed)
Daily Session Note  Patient Details  Name: Sherri Price MRN: [id number] Date of Birth: [birth year] Referring Provider:     Pulmonary Rehab from 05/05/2017 in [facility] Cardiac and Pulmonary Rehab  Referring Provider  Caryl Comes      Encounter Date: 05/20/2017  Check In: Session Check In - 05/20/17 0959      Check-In   Location  [facility]Cardiac & Pulmonary Rehab    Staff Present  Justin Mend Lorre Nick, MA, ACSM RCEP, Exercise Physiologist;Amanda Oletta Darter, BA, ACSM CEP, Exercise Physiologist    Supervising physician immediately available to respond to emergencies  [facility] immediately available ER MD    Physician(s)  Dr. Cinda Quest and Joni Fears    Medication changes reported      No    Fall or balance concerns reported     No    Warm-up and Cool-down  Performed as group-led instruction    Resistance Training Performed  Yes    VAD Patient?  No      Pain Assessment   Currently in Pain?  No/denies          Social History   Tobacco Use  Smoking Status Never Smoker  Smokeless Tobacco Never Used    Goals Met:  Exercise tolerated well No report of cardiac concerns or symptoms Strength training completed today  Goals Unmet:  Not Applicable  Comments: Pt able to follow exercise prescription today without complaint.  Will continue to monitor for progression.   Dr. Emily Filbert is Medical Director for [place] and [facility].

## 2017-05-22 DIAGNOSIS — J8410 Pulmonary fibrosis, unspecified: Secondary | ICD-10-CM | POA: Diagnosis not present

## 2017-05-22 NOTE — Progress Notes (Signed)
Daily Session Note  Patient Details  Name: Sherri Price MRN: [id number] Date of Birth: September 05, 1933 Referring Provider:     Pulmonary Rehab from 05/05/2017 in [facility] Cardiac and Pulmonary Rehab  Referring Provider  Caryl Comes      Encounter Date: 05/22/2017  Check In: Session Check In - 05/22/17 1010      Check-In   Location  [facility]Cardiac & Pulmonary Rehab    Staff Present  Justin Mend RCP,RRT,BSRT;Amanda Oletta Darter, BA, ACSM CEP, Exercise Physiologist;Meredith Sherryll Burger, RN BSN    Supervising physician immediately available to respond to emergencies  [facility] immediately available ER MD    Physician(s)  Dr. Corky Downs and Clearnce Hasten    Medication changes reported      No    Fall or balance concerns reported     No    Warm-up and Cool-down  Performed as group-led instruction    Resistance Training Performed  Yes    VAD Patient?  No      Pain Assessment   Currently in Pain?  No/denies          Social History   Tobacco Use  Smoking Status Never Smoker  Smokeless Tobacco Never Used    Goals Met:  Exercise tolerated well No report of cardiac concerns or symptoms Strength training completed today  Goals Unmet:  Not Applicable  Comments: Pt able to follow exercise prescription today without complaint.  Will continue to monitor for progression.   Dr. Emily Filbert is Medical Director for [place] and [facility].

## 2017-05-25 DIAGNOSIS — J8410 Pulmonary fibrosis, unspecified: Secondary | ICD-10-CM

## 2017-05-25 NOTE — Progress Notes (Signed)
Daily Session Note  Patient Details  Name: Sherri Price MRN: [id number] Date of Birth: November 28, 1933 Referring Provider:     Pulmonary Rehab from 05/05/2017 in [facility] Cardiac and Pulmonary Rehab  Referring Provider  Caryl Comes      Encounter Date: 05/25/2017  Check In: Session Check In - 05/25/17 0957      Check-In   Location  [facility] Pulmonary Re[facility]    Staff Present  Justin Mend RCP,RRT,BSRT;Amanda Oletta Darter, [place], ACSM CEP, Exercise Physiologist;Kelly Amedeo Plenty, BS, ACSM CEP, Exercise Physiologist    Supervising physician immediately available to respond to emergencies  [facility] immediately available ER MD    Physician(s)  Dr. Kerman Passey and Jimmye Norman    Medication changes reported      No    Fall or balance concerns reported     No    Warm-up and Cool-down  Performed as group-led instruction    Resistance Training Performed  Yes    VAD Patient?  No      Pain Assessment   Currently in Pain?  No/denies    Multiple Pain Sites  No          Social History   Tobacco Use  Smoking Status Never Smoker  Smokeless Tobacco Never Used    Goals Met:  Independence with exercise equipment Exercise tolerated well No report of cardiac concerns or symptoms Strength training completed today  Goals Unmet:  Not Applicable  Comments: Pt able to follow exercise prescription today without complaint.  Will continue to monitor for progression.   Dr. Emily Filbert is Medical Director for [place] and [facility].

## 2017-05-27 DIAGNOSIS — J8410 Pulmonary fibrosis, unspecified: Secondary | ICD-10-CM | POA: Diagnosis not present

## 2017-05-27 NOTE — Progress Notes (Signed)
Daily Session Note  Patient Details  Name: Sherri Price MRN: [id number] Date of Birth: [birth year] Referring Provider:     Pulmonary Rehab from 05/05/2017 in [facility] Cardiac and Pulmonary Rehab  Referring Provider  Caryl Comes      Encounter Date: 05/27/2017  Check In: Session Check In - 05/27/17 1011      Check-In   Location  [facility]monary Re[facility]    Staff Present  Justin Mend Lorre Nick, MA, ACSM RCEP, Exercise Physiologist;Amanda Oletta Darter, [place], ACSM CEP, Exercise Physiologist    Supervising physician immediately available to respond to emergencies  [facility] immediately available ER MD    Physician(s)  Dr. Quentin Cornwall and Jimmye Norman    Medication changes reported      No    Fall or balance concerns reported     No    Warm-up and Cool-down  Performed as group-led instruction    Resistance Training Performed  Yes    VAD Patient?  No      Pain Assessment   Currently in Pain?  No/denies          Social History   Tobacco Use  Smoking Status Never Smoker  Smokeless Tobacco Never Used    Goals Met:  Exercise tolerated well No report of cardiac concerns or symptoms Strength training completed today  Goals Unmet:  Not Applicable  Comments: Pt able to follow exercise prescription today without complaint.  Will continue to monitor for progression.   Dr. Emily Filbert is Medical Director for [place] and [facility].

## 2017-05-29 DIAGNOSIS — J8410 Pulmonary fibrosis, unspecified: Secondary | ICD-10-CM | POA: Diagnosis not present

## 2017-05-29 NOTE — Progress Notes (Signed)
Daily Session Note  Patient Details  Name: RANETTE LUCKADOO MRN: [id number] Date of Birth: [birth year] Referring Provider:     Pulmonary Rehab from 05/05/2017 in [facility] Cardiac and Pulmonary Rehab  Referring Provider  Caryl Comes      Encounter Date: 05/29/2017  Check In: Session Check In - 05/29/17 1008      Check-In   Location  [facility]Cardiac [facility] Pulmonary Rehab    Staff Present  Justin Mend RCP,RRT,BSRT;Amanda Oletta Darter, BA, ACSM CEP, Exercise Physiologist;Meredith Sherryll Burger, RN BSN    Supervising physician immediately available to respond to emergencies  [facility] immediately available ER MD    Physician(s)  Dr. Alfred Levins and Corky Downs    Medication changes reported      No    Fall or balance concerns reported     No    Warm-up and Cool-down  Performed as group-led instruction    Resistance Training Performed  Yes    VAD Patient?  No      Pain Assessment   Currently in Pain?  No/denies          Social History   Tobacco Use  Smoking Status Never Smoker  Smokeless Tobacco Never Used    Goals Met:  Independence with exercise equipment Exercise tolerated well No report of cardiac concerns or symptoms Strength training completed today  Goals Unmet:  Not Applicable  Comments: Pt able to follow exercise prescription today without complaint.  Will continue to monitor for progression.   Dr. Emily Filbert is Medical Director for [place] and [facility].

## 2017-06-01 DIAGNOSIS — J8410 Pulmonary fibrosis, unspecified: Secondary | ICD-10-CM | POA: Diagnosis not present

## 2017-06-01 NOTE — Progress Notes (Signed)
Pulmonary Individual Treatment Plan  Patient Details  Name: Sherri Price MRN: [id number] Date of Birth: 1934/01/03 Referring Provider:     Pulmonary Rehab from 05/05/2017 in [facility] Cardiac and Pulmonary Rehab  Referring Provider  Caryl Comes      Initial Encounter Date:    Pulmonary Rehab from 05/05/2017 in [facility] Cardiac and Pulmonary Rehab  Date  05/05/17  Referring Provider  Caryl Comes      Visit Diagnosis: Pulmonary fibrosis ([place])  Patient's Home Medications on Admission:  Current Outpatient Medications:  .  Ascorbic Acid (VITAMIN C) 1000 MG tablet, Take 1,000 mg by mouth 2 (two) times daily., Disp: , Rfl:  .  Cholecalciferol (VITAMIN D3) 5000 UNITS CAPS, Take 1 capsule by mouth daily., Disp: , Rfl:  .  Coenzyme Q10 100 MG capsule, Take 100 mg by mouth daily. , Disp: , Rfl:  .  Cranberry Fruit 405 MG CAPS, Take 1 capsule by mouth 2 (two) times daily. , Disp: , Rfl:  .  Evening Primrose Oil 500 MG CAPS, Take 1 capsule by mouth 2 (two) times daily. , Disp: , Rfl:  .  FLUOCINOLONE ACETONIDE SCALP 4.27 % OIL, 1 application by Other route daily as needed (psoriasis on scalp). , Disp: , Rfl:  .  L-Lysine 1000 MG TABS, Take 1 tablet by mouth 2 (two) times daily. , Disp: , Rfl:  .  levothyroxine (LEVOXYL) 100 MCG tablet, Take 100 mcg by mouth daily before breakfast. , Disp: , Rfl:  .  Multiple Vitamin (MULTIVITAMIN WITH MINERALS) TABS tablet, Take 1 tablet by mouth daily., Disp: , Rfl:  .  NON FORMULARY, Take 1 tablet by mouth 2 (two) times daily. calcium hydroxyapatite, Disp: , Rfl:  .  NON FORMULARY, Take 1 capsule by mouth 2 (two) times daily. With lunch and dinner. lactate prophase papain bromein, Disp: , Rfl:  .  NON FORMULARY, Take 1 tablet by mouth 2 (two) times daily. Quercetine, Disp: , Rfl:  .  NON FORMULARY, Take 1 Dose by mouth daily. Mixes these 3 OTC triple fiber, fiber smart, psyllium in a cup of juice every morning, Disp: , Rfl:  .  NON FORMULARY, Take 1 capsule by mouth 2 (two)  times daily. Eye health with lutein, Disp: , Rfl:  .  omeprazole (PRILOSEC) 20 MG capsule, Take 20 mg by mouth daily. , Disp: , Rfl:  .  Probiotic Product (PROBIOTIC DAILY PO), Take 2 tablets by mouth at bedtime., Disp: , Rfl:  .  Red Yeast Rice Extract 600 MG CAPS, Take 1 capsule by mouth 2 (two) times daily. , Disp: , Rfl:  .  rivaroxaban (XARELTO) 20 MG TABS tablet, Take 20 mg by mouth 2 (two) times daily at 10 AM and 5 PM., Disp: , Rfl:  .  TURMERIC CURCUMIN PO, Take 1 capsule by mouth daily. Use. One bid, Disp: , Rfl:  .  vitamin E 400 UNIT capsule, Take 400 Units by mouth 2 (two) times daily. , Disp: , Rfl:   Past Medical History: Past Medical History:  Diagnosis Date  . Arthritis   . Bell's palsy   . Cough    CHRONIC  . GERD (gastroesophageal reflux disease)   . Hypothyroidism   . IBS (irritable bowel syndrome)   . Neuropathy   . Oxygen decrease    USES AT NIGHT  . Pulmonary fibrosis ([place])   . Shortness of breath dyspnea     Tobacco Use: Social History   Tobacco Use  Smoking Status Never Smoker  Smokeless Tobacco Never Used    Labs: Recent Review Flowsheet Data    There is no flowsheet data to display.       Pulmonary Assessment Scores: Pulmonary Assessment Scores    Row Name 05/05/17 1133         ADL UCSD   ADL Phase  Entry     SOB Score total  55     Rest  0     Walk  3     Stairs  4     Bath  2     Dress  1     Shop  2       CAT Score   CAT Score  18       mMRC Score   mMRC Score  2        Pulmonary Function Assessment: Pulmonary Function Assessment - 05/05/17 1222      Breath   Bilateral Breath Sounds  Clear    Shortness of Breath  Fear of Shortness of Breath;Panic with Shortness of Breath;Limiting activity;Yes       Exercise Target Goals:    Exercise Program Goal: Individual exercise prescription set with THRR, safety & activity barriers. Participant demonstrates ability to understand and report RPE using BORG scale, to  self-measure pulse accurately, and to acknowledge the importance of the exercise prescription.  Exercise Prescription Goal: Starting with aerobic activity 30 plus minutes a day, 3 days per week for initial exercise prescription. Provide home exercise prescription and guidelines that participant acknowledges understanding prior to discharge.  Activity Barriers & Risk Stratification:   6 Minute Walk: 6 Minute Walk    Row Name 05/05/17 1256         6 Minute Walk   Distance  880 feet     Walk Time  6 minutes     # of Rest Breaks  0     MPH  1.67     METS  1.51     RPE  12     Perceived Dyspnea   2     VO2 Peak  5.28     Symptoms  No     Resting HR  80 bpm     Resting BP  106/52     Resting Oxygen Saturation   92 %     Exercise Oxygen Saturation  during 6 min walk  91 %     Max Ex. HR  84 bpm     Max Ex. BP  116/54     2 Minute Post BP  96/54       Interval HR   1 Minute HR  81     2 Minute HR  84     3 Minute HR  83     4 Minute HR  82     5 Minute HR  84     6 Minute HR  82     2 Minute Post HR  76     Interval Heart Rate?  Yes       Interval Oxygen   Interval Oxygen?  Yes     Baseline Oxygen Saturation %  92 %     1 Minute Oxygen Saturation %  94 %     1 Minute Liters of Oxygen  3 L pulsed - own tank     2 Minute Oxygen Saturation %  94 %     2 Minute Liters of Oxygen  3 L  3 Minute Oxygen Saturation %  94 %     3 Minute Liters of Oxygen  3 L     4 Minute Oxygen Saturation %  92 %     4 Minute Liters of Oxygen  3 L     5 Minute Oxygen Saturation %  91 %     5 Minute Liters of Oxygen  3 L     6 Minute Oxygen Saturation %  93 %     6 Minute Liters of Oxygen  3 L     2 Minute Post Oxygen Saturation %  96 %     2 Minute Post Liters of Oxygen  3 L       Oxygen Initial Assessment: Oxygen Initial Assessment - 05/05/17 1144      Home Oxygen   Home Oxygen Device  Home Concentrator;E-Tanks    Sleep Oxygen Prescription  Continuous    Liters per minute  3     Home Exercise Oxygen Prescription  Continuous    Liters per minute  3    Home at Rest Exercise Oxygen Prescription  Continuous    Liters per minute  3    Compliance with Home Oxygen Use  Yes      Initial 6 min Walk   Oxygen Used  Continuous    Liters per minute  3      Program Oxygen Prescription   Program Oxygen Prescription  Continuous    Liters per minute  3      Intervention   Short Term Goals  To learn and exhibit compliance with exercise, home and travel O2 prescription;To learn and understand importance of maintaining oxygen saturations>88%;To learn and understand importance of monitoring SPO2 with pulse oximeter and demonstrate accurate use of the pulse oximeter.;To learn and demonstrate proper pursed lip breathing techniques or other breathing techniques.;To learn and demonstrate proper use of respiratory medications    Long  Term Goals  Exhibits compliance with exercise, home and travel O2 prescription;Verbalizes importance of monitoring SPO2 with pulse oximeter and return demonstration;Maintenance of O2 saturations>88%;Exhibits proper breathing techniques, such as pursed lip breathing or other method taught during program session;Compliance with respiratory medication;Demonstrates proper use of MDI's       Oxygen Re-Evaluation: Oxygen Re-Evaluation    Row Name 05/13/17 1105             Program Oxygen Prescription   Program Oxygen Prescription  Continuous       Liters per minute  4       Comments  Increased to 4L as she was at 82% on treadmill on 3L         Home Oxygen   Home Oxygen Device  Home Concentrator;E-Tanks       Sleep Oxygen Prescription  Continuous       Liters per minute  3       Home Exercise Oxygen Prescription  Continuous       Liters per minute  3       Home at Rest Exercise Oxygen Prescription  Continuous       Liters per minute  3       Compliance with Home Oxygen Use  Yes         Goals/Expected Outcomes   Short Term Goals  To learn and  understand importance of monitoring SPO2 with pulse oximeter and demonstrate accurate use of the pulse oximeter.;To learn and demonstrate proper pursed lip breathing techniques or other breathing techniques.;To learn and  understand importance of maintaining oxygen saturations>88%       Long  Term Goals  Verbalizes importance of monitoring SPO2 with pulse oximeter and return demonstration;Maintenance of O2 saturations>88%;Exhibits proper breathing techniques, such as pursed lip breathing or other method taught during program session       Comments  Reviewed PLB technique with pt.  Talked about how it work and it's important to maintaining his exercise saturations.         Goals/Expected Outcomes  Short: Become more profiecient at using PLB.   Long: Become independent at using PLB.          Oxygen Discharge (Final Oxygen Re-Evaluation): Oxygen Re-Evaluation - 05/13/17 1105      Program Oxygen Prescription   Program Oxygen Prescription  Continuous    Liters per minute  4    Comments  Increased to 4L as she was at 82% on treadmill on 3L      Home Oxygen   Home Oxygen Device  Home Concentrator;E-Tanks    Sleep Oxygen Prescription  Continuous    Liters per minute  3    Home Exercise Oxygen Prescription  Continuous    Liters per minute  3    Home at Rest Exercise Oxygen Prescription  Continuous    Liters per minute  3    Compliance with Home Oxygen Use  Yes      Goals/Expected Outcomes   Short Term Goals  To learn and understand importance of monitoring SPO2 with pulse oximeter and demonstrate accurate use of the pulse oximeter.;To learn and demonstrate proper pursed lip breathing techniques or other breathing techniques.;To learn and understand importance of maintaining oxygen saturations>88%    Long  Term Goals  Verbalizes importance of monitoring SPO2 with pulse oximeter and return demonstration;Maintenance of O2 saturations>88%;Exhibits proper breathing techniques, such as pursed lip  breathing or other method taught during program session    Comments  Reviewed PLB technique with pt.  Talked about how it work and it's important to maintaining his exercise saturations.      Goals/Expected Outcomes  Short: Become more profiecient at using PLB.   Long: Become independent at using PLB.       Initial Exercise Prescription: Initial Exercise Prescription - 05/05/17 1200      Date of Initial Exercise RX and Referring Provider   Date  05/05/17    Referring Provider  Caryl Comes      Oxygen   Oxygen  Intermittent at home - will use continuous exercise    Liters  3      Treadmill   MPH  1.5    Grade  0    Minutes  15    METs  2.15      Recumbant Bike   Level  1    RPM  60    Watts  7    Minutes  15    METs  2.15      NuStep   Level  3    SPM  80    Minutes  15    METs  2.15      Prescription Details   Frequency (times per week)  3    Duration  Progress to 45 minutes of aerobic exercise without signs/symptoms of physical distress      Intensity   THRR 40-80% of Max Heartrate  104-127    Ratings of Perceived Exertion  11-13    Perceived Dyspnea  0-4      Resistance Training  Training Prescription  Yes    Weight  2 lb    Reps  10-15       Perform Capillary Blood Glucose checks as needed.  Exercise Prescription Changes: Exercise Prescription Changes    Row Name 05/25/17 1100             Home Exercise Plan   Plans to continue exercise at  [facility] (comment) [employer] or [place]       Frequency  Add 1 additional day to program exercise sessions.       Initial Home Exercises Provided  05/25/17          Exercise Comments: Exercise Comments    Row Name 05/13/17 1104           Exercise Comments  First full day of exercise!  Patient was oriented to gym and equipment including functions, settings, policies, and procedures.  Patient's individual exercise prescription and treatment plan were reviewed.  All starting workloads were established  based on the results of the 6 minute walk test done at initial orientation visit.  The plan for exercise progression was also introduced and progression will be customized based on patient's performance and goals.          Exercise Goals and Review: Exercise Goals    Row Name 05/05/17 1255             Exercise Goals   Increase Physical Activity  Yes       Intervention  Provide advice, education, support and counseling about physical activity/exercise needs.;Develop an individualized exercise prescription for aerobic and resistive training based on initial evaluation findings, risk stratification, comorbidities and participant's personal goals.       Expected Outcomes  Achievement of increased cardiorespiratory fitness and enhanced flexibility, muscular endurance and strength shown through measurements of functional capacity and personal statement of participant.       Increase Strength and Stamina  Yes       Intervention  Provide advice, education, support and counseling about physical activity/exercise needs.;Develop an individualized exercise prescription for aerobic and resistive training based on initial evaluation findings, risk stratification, comorbidities and participant's personal goals.       Expected Outcomes  Achievement of increased cardiorespiratory fitness and enhanced flexibility, muscular endurance and strength shown through measurements of functional capacity and personal statement of participant.       Able to understand and use rate of perceived exertion (RPE) scale  Yes       Intervention  Provide education and explanation on how to use RPE scale       Expected Outcomes  Short Term: Able to use RPE daily in rehab to express subjective intensity level;Long Term:  Able to use RPE to guide intensity level when exercising independently       Able to understand and use Dyspnea scale  Yes       Intervention  Provide education and explanation on how to use Dyspnea scale        Expected Outcomes  Short Term: Able to use Dyspnea scale daily in rehab to express subjective sense of shortness of breath during exertion;Long Term: Able to use Dyspnea scale to guide intensity level when exercising independently       Knowledge and understanding of Target Heart Rate Range (THRR)  Yes       Intervention  Provide education and explanation of THRR including how the numbers were predicted and where they are located for reference  Expected Outcomes  Short Term: Able to state/look up THRR;Long Term: Able to use THRR to govern intensity when exercising independently;Short Term: Able to use daily as guideline for intensity in rehab       Able to check pulse independently  Yes       Intervention  Provide education and demonstration on how to check pulse in carotid and radial arteries.;Review the importance of being able to check your own pulse for safety during independent exercise       Expected Outcomes  Short Term: Able to explain why pulse checking is important during independent exercise;Long Term: Able to check pulse independently and accurately       Understanding of Exercise Prescription  Yes       Intervention  Provide education, explanation, and written materials on patient's individual exercise prescription       Expected Outcomes  Short Term: Able to explain program exercise prescription;Long Term: Able to explain home exercise prescription to exercise independently          Exercise Goals Re-Evaluation : Exercise Goals Re-Evaluation    Row Name 05/13/17 1104 05/25/17 1106           Exercise Goal Re-Evaluation   Exercise Goals Review  Knowledge and understanding of Target Heart Rate Range (THRR);Understanding of Exercise Prescription;Able to understand and use rate of perceived exertion (RPE) scale;Able to understand and use Dyspnea scale  Increase Physical Activity;Able to understand and use Dyspnea scale;Increase Strength and Stamina;Knowledge and understanding of  Target Heart Rate Range (THRR);Able to understand and use rate of perceived exertion (RPE) scale;Able to check pulse independently      Comments  Reviewed RPE scale, THR and program prescription with pt today.  Pt voiced understanding and was given a copy of goals to take home  Rima previously exercised at the [facility] before needing O2during the day.  She plans to exercise at home one day per week starting now.  Staff gave info about [employer].      Expected Outcomes  Short: Use RPE daily to regulate intensity.  Long: Follow program prescription in THR.  Short - Ioana will add one day of exrecise per week.  Long - Guneet will maintain exercise on her own when she completes [facility]         Discharge Exercise Prescription (Final Exercise Prescription Changes): Exercise Prescription Changes - 05/25/17 1100      Home Exercise Plan   Plans to continue exercise at  [employer] (comment) [employer] or [place]    Frequency  Add 1 additional day to program exercise sessions.    Initial Home Exercises Provided  05/25/17       Nutrition:  Target Goals: Understanding of nutrition guidelines, daily intake of sodium '1500mg'$ , cholesterol '200mg'$ , calories 30% from fat and 7% or less from saturated fats, daily to have 5 or more servings of fruits and vegetables.  Biometrics: Pre Biometrics - 05/05/17 1255      Pre Biometrics   Height  4' 11.5" (1.511 m)    Weight  100 lb 8 oz (45.6 kg)    Waist Circumference  29 inches    Hip Circumference  35.75 inches    Waist to Hip Ratio  0.81 %    BMI (Calculated)  19.97        Nutrition Therapy Plan and Nutrition Goals: Nutrition Therapy & Goals - 05/05/17 1143      Nutrition Therapy   RD appointment defered  Yes  Personal Nutrition Goals   Comments  She declines the dietician appointment      Intervention Plan   Intervention  Prescribe, educate and counsel regarding individualized specific dietary modifications aiming towards targeted  core components such as weight, hypertension, lipid management, diabetes, heart failure and other comorbidities.    Expected Outcomes  Short Term Goal: Understand basic principles of dietary content, such as calories, fat, sodium, cholesterol and nutrients.;Long Term Goal: Adherence to prescribed nutrition plan.       Nutrition Discharge: Rate Your Plate Scores: Nutrition Assessments - 05/13/17 1213      MEDFICTS Scores   Pre Score  12       Nutrition Goals Re-Evaluation:   Nutrition Goals Discharge (Final Nutrition Goals Re-Evaluation):   Psychosocial: Target Goals: Acknowledge presence or absence of significant depression and/or stress, maximize coping skills, provide positive support system. Participant is able to verbalize types and ability to use techniques and skills needed for reducing stress and depression.   Initial Review & Psychosocial Screening: Initial Psych Review & Screening - 05/05/17 1141      Initial Review   Current issues with  Current Sleep Concerns    Source of Stress Concerns  Chronic Illness      Family Dynamics   Good Support System?  Yes    Comments  Her husband is a good support system. Most of her family is in [place].      Barriers   Psychosocial barriers to participate in program  The patient should benefit from training in stress management and relaxation.      Screening Interventions   Interventions  Yes;Encouraged to exercise;Program counselor consult;Provide feedback about the scores to participant;To provide support and resources with identified psychosocial needs    Expected Outcomes  Short Term goal: Identification and review with participant of any Quality of Life or Depression concerns found by scoring the questionnaire.;Long Term goal: The participant improves quality of Life and PHQ9 Scores as seen by post scores and/or verbalization of changes;Long Term Goal: Stressors or current issues are controlled or eliminated.;Short Term goal:  Utilizing psychosocial counselor, staff and physician to assist with identification of specific Stressors or current issues interfering with healing process. Setting desired goal for each stressor or current issue identified.       Quality of Life Scores:   PHQ-9: Recent Review Flowsheet Data    Depression screen [facility] 2/9 05/05/2017 04/30/2017   Decreased Interest 3 0   Down, Depressed, Hopeless 0 0   PHQ - 2 Score 3 0   Altered sleeping 3 -   Tired, decreased energy 3 -   Change in appetite 0 -   Feeling bad or failure about yourself  0 -   Trouble concentrating 0 -   Moving slowly or fidgety/restless 0 -   Suicidal thoughts 0 -   PHQ-9 Score 9 -   Difficult doing work/chores Very difficult -     Interpretation of Total Score  Total Score Depression Severity:  1-4 = Minimal depression, 5-9 = Mild depression, 10-14 = Moderate depression, 15-19 = Moderately severe depression, 20-27 = Severe depression   Psychosocial Evaluation and Intervention: Psychosocial Evaluation - 05/13/17 1108      Psychosocial Evaluation & Interventions   Interventions  Encouraged to exercise with the program and follow exercise prescription;Relaxation education    Comments  Counselor met with Ms. Brigitte [facility]) for initial psychosocial evaluation.  She is an 84 year old who has a diagnosis of pulmonary fibrosis.  She was just released from the hospital several weeks ago with complications related to this disease.  Iracema has a strong support system with a spouse of 91 years; one of his sons and family close by and she is actively involved in her local church.  She also has (3) children who check in often and live in [place].  Loletta reports being healthy for the most part besides the pulmonary diagnosis.  She sleeps "fair" at night and has a good appetite.  She denies a history or current symptoms of depression or anxiety and states she is generally in a positive mood most of the time.  Ethelreda has goals to  breathe better and increase her stamina/strength overall while in this program.  Staff will follow with Dagmar throughout the course of this program.      Expected Outcomes  Nneoma will benefit from consistent exercise to achieve her stated goals.  The educational and psychoeducational components of this program will be helpful in understanding and coping better with her condition.         Psychosocial Re-Evaluation:   Psychosocial Discharge (Final Psychosocial Re-Evaluation):   Education: Education Goals: Education classes will be provided on a weekly basis, covering required topics. Participant will state understanding/return demonstration of topics presented.  Learning Barriers/Preferences: Learning Barriers/Preferences - 05/05/17 1144      Learning Barriers/Preferences   Learning Barriers  Sight wears glasses    Learning Preferences  None       Education Topics: Initial Evaluation Education: - Verbal, written and demonstration of respiratory meds, RPE/PD scales, oximetry and breathing techniques. Instruction on use of nebulizers and MDIs: cleaning and proper use, rinsing mouth with steroid doses and importance of monitoring MDI activations.   Pulmonary Rehab from 05/27/2017 in [facility] Cardiac and Pulmonary Rehab  Date  05/05/17  Educator  [facility]  Instruction Review Code  1- Verbalizes Understanding      General Nutrition Guidelines/Fats and Fiber: -Group instruction provided by verbal, written material, models and posters to present the general guidelines for heart healthy nutrition. Gives an explanation and review of dietary fats and fiber.   Controlling Sodium/Reading Food Labels: -Group verbal and written material supporting the discussion of sodium use in heart healthy nutrition. Review and explanation with models, verbal and written materials for utilization of the food label.   Exercise Physiology & Risk Factors: - Group verbal and written instruction with models to review  the exercise physiology of the cardiovascular system and associated critical values. Details cardiovascular disease risk factors and the goals associated with each risk factor.   Pulmonary Rehab from 05/27/2017 in [facility] Cardiac and Pulmonary Rehab  Date  05/22/17  Educator  [facility]  Instruction Review Code  1- Verbalizes Understanding      Aerobic Exercise & Resistance Training: - Gives group verbal and written discussion on the health impact of inactivity. On the components of aerobic and resistive training programs and the benefits of this training and how to safely progress through these programs.   Flexibility, Balance, General Exercise Guidelines: - Provides group verbal and written instruction on the benefits of flexibility and balance training programs. Provides general exercise guidelines with specific guidelines to those with heart or lung disease. Demonstration and skill practice provided.   Stress Management: - Provides group verbal and written instruction about the health risks of elevated stress, cause of high stress, and healthy ways to reduce stress.   Depression: - Provides group verbal and written instruction on the correlation between heart/lung  disease and depressed mood, treatment options, and the stigmas associated with seeking treatment.   Exercise & Equipment Safety: - Individual verbal instruction and demonstration of equipment use and safety with use of the equipment.   Pulmonary Rehab from 05/27/2017 in [facility] Cardiac and Pulmonary Rehab  Date  05/05/17  Educator  [facility]  Instruction Review Code  1- Verbalizes Understanding      Infection Prevention: - Provides verbal and written material to individual with discussion of infection control including proper hand washing and proper equipment cleaning during exercise session.   Pulmonary Rehab from 05/27/2017 in [facility] Cardiac and Pulmonary Rehab  Date  05/05/17  Educator  [facility]  Instruction Review Code  1- Verbalizes  Understanding      Falls Prevention: - Provides verbal and written material to individual with discussion of falls prevention and safety.   Pulmonary Rehab from 05/27/2017 in [facility] Cardiac and Pulmonary Rehab  Date  05/05/17  Educator  [facility]  Instruction Review Code  1- Verbalizes Understanding      Diabetes: - Individual verbal and written instruction to review signs/symptoms of diabetes, desired ranges of glucose level fasting, after meals and with exercise. Advice that pre and post exercise glucose checks will be done for 3 sessions at entry of program.   Chronic Lung Diseases: - Group verbal and written instruction to review new updates, new respiratory medications, new advancements in procedures and treatments. Provide informative websites and "800" numbers of self-education.   Pulmonary Rehab from 05/27/2017 in [facility] Cardiac and Pulmonary Rehab  Date  05/20/17  Educator  [facility]  Instruction Review Code  1- Verbalizes Understanding      Lung Procedures: - Group verbal and written instruction to describe testing methods done to diagnose lung disease. Review the outcome of test results. Describe the treatment choices: Pulmonary Function Tests, ABGs and oximetry.   Energy Conservation: - Provide group verbal and written instruction for methods to conserve energy, plan and organize activities. Instruct on pacing techniques, use of adaptive equipment and posture/positioning to relieve shortness of breath.   Triggers: - Group verbal and written instruction to review types of environmental controls: home humidity, furnaces, filters, dust mite/pet prevention, HEPA vacuums. To discuss weather changes, air quality and the benefits of nasal washing.   Exacerbations: - Group verbal and written instruction to provide: warning signs, infection symptoms, calling MD promptly, preventive modes, and value of vaccinations. Review: effective airway clearance, coughing and/or vibration techniques. Create  an Sports administrator.   Oxygen: - Individual and group verbal and written instruction on oxygen therapy. Includes supplement oxygen, available portable oxygen systems, continuous and intermittent flow rates, oxygen safety, concentrators, and Medicare reimbursement for oxygen.   Pulmonary Rehab from 05/27/2017 in [facility] Cardiac and Pulmonary Rehab  Date  05/05/17  Educator  [facility]  Instruction Review Code  1- Verbalizes Understanding      Respiratory Medications: - Group verbal and written instruction to review medications for lung disease. Drug class, frequency, complications, importance of spacers, rinsing mouth after steroid MDI's, and proper cleaning methods for nebulizers.   AED/CPR: - Group verbal and written instruction with the use of models to demonstrate the basic use of the AED with the basic ABC's of resuscitation.   Breathing Retraining: - Provides individuals verbal and written instruction on purpose, frequency, and proper technique of diaphragmatic breathing and pursed-lipped breathing. Applies individual practice skills.   Pulmonary Rehab from 05/27/2017 in [facility] Cardiac and Pulmonary Rehab  Date  05/05/17  Educator  [facility]  Instruction Review Code  1- Actuary and Physiology of the Lungs: - Group verbal and written instruction with the use of models to provide basic lung anatomy and physiology related to function, structure and complications of lung disease.   Anatomy & Physiology of the Heart: - Group verbal and written instruction and models provide basic cardiac anatomy and physiology, with the coronary electrical and arterial systems. Review of: AMI, Angina, Valve disease, Heart Failure, Cardiac Arrhythmia, Pacemakers, and the ICD.   Heart Failure: - Group verbal and written instruction on the basics of heart failure: signs/symptoms, treatments, explanation of ejection fraction, enlarged heart and cardiomyopathy.   Sleep Apnea: - Individual verbal  and written instruction to review Obstructive Sleep Apnea. Review of risk factors, methods for diagnosing and types of masks and machines for OSA.   Anxiety: - Provides group, verbal and written instruction on the correlation between heart/lung disease and anxiety, treatment options, and management of anxiety.   Relaxation: - Provides group, verbal and written instruction about the benefits of relaxation for patients with heart/lung disease. Also provides patients with examples of relaxation techniques.   Cardiac Medications: - Group verbal and written instruction to review commonly prescribed medications for heart disease. Reviews the medication, class of the drug, and side effects.   Know Your Numbers: -Group verbal and written instruction about important numbers in your health.  Review of Cholesterol, Blood Pressure, Diabetes, and BMI and the role they play in your overall health.   Pulmonary Rehab from 05/27/2017 in [facility] Cardiac and Pulmonary Rehab  Date  05/27/17  Educator  [facility]  Instruction Review Code  1- Verbalizes Understanding      Other: -Provides group and verbal instruction on various topics (see comments)    Knowledge Questionnaire Score: Knowledge Questionnaire Score - 05/05/17 1152      Knowledge Questionnaire Score   Pre Score  16/18 reviewd with patient.        Core Components/Risk Factors/Patient Goals at Admission: Personal Goals and Risk Factors at Admission - 05/05/17 1146      Core Components/Risk Factors/Patient Goals on Admission    Weight Management  Weight Maintenance;Yes    Intervention  Weight Management: Develop a combined nutrition and exercise program designed to reach desired caloric intake, while maintaining appropriate intake of nutrient and fiber, sodium and fats, and appropriate energy expenditure required for the weight goal.;Weight Management/Obesity: Establish reasonable short term and long term weight goals.;Weight Management: Provide  education and appropriate resources to help participant work on and attain dietary goals.    Admit Weight  100 lb 8 oz (45.6 kg)    Goal Weight: Short Term  100 lb (45.4 kg)    Goal Weight: Long Term  100 lb (45.4 kg)    Expected Outcomes  Short Term: Continue to assess and modify interventions until short term weight is achieved;Long Term: Adherence to nutrition and physical activity/exercise program aimed toward attainment of established weight goal;Weight Maintenance: Understanding of the daily nutrition guidelines, which includes 25-35% calories from fat, 7% or less cal from saturated fats, less than '200mg'$  cholesterol, less than 1.5gm of sodium, & 5 or more servings of fruits and vegetables daily    Improve shortness of breath with ADL's  Yes    Intervention  Provide education, individualized exercise plan and daily activity instruction to help decrease symptoms of SOB with activities of daily living.    Expected Outcomes  Short Term: Achieves a reduction of symptoms when  performing activities of daily living.       Core Components/Risk Factors/Patient Goals Review:    Core Components/Risk Factors/Patient Goals at Discharge (Final Review):    ITP Comments: ITP Comments    Row Name 05/05/17 1102 05/15/17 1027 06/01/17 0822       ITP Comments  Medical Evaluation completed. Chart sent for review and changes to Dr. Emily Filbert Director of [place]. Diagnosis can be found in [facility] encounter 05/05/17  Patient needs to use 6 liters when exercising. Her oxygen on 4 liters was below 85% on the treadmill. Will send note to Dr. Lequita Halt for an Oxygen order for 6 liters.  30 day review completed. ITP sent to Dr. Emily Filbert Director of [place]. Continue with ITP unless changes are made by physician.        Comments: 30 day review

## 2017-06-01 NOTE — Progress Notes (Signed)
Daily Session Note  Patient Details  Name: Sherri Price MRN: [id number] Date of Birth: November 16, 1933 Referring Provider:     Pulmonary Rehab from 05/05/2017 in [facility] Cardiac and Pulmonary Rehab  Referring Provider  Caryl Comes      Encounter Date: 06/01/2017  Check In: Session Check In - 06/01/17 1010      Check-In   Location  [facility]-Cardiac & Pulmonary Rehab    Staff Present  Justin Mend Jaci Carrel, BS, ACSM CEP, Exercise Physiologist;Amanda Oletta Darter, [place], ACSM CEP, Exercise Physiologist    Supervising physician immediately available to respond to emergencies  [facility] immediately available ER MD    Physician(s)  Dr. Clearnce Hasten and Reita Cliche    Medication changes reported      No    Fall or balance concerns reported     No    Warm-up and Cool-down  Performed as group-led instruction    Resistance Training Performed  Yes    VAD Patient?  No      Pain Assessment   Currently in Pain?  No/denies          Social History   Tobacco Use  Smoking Status Never Smoker  Smokeless Tobacco Never Used    Goals Met:  Independence with exercise equipment Exercise tolerated well No report of cardiac concerns or symptoms Strength training completed today  Goals Unmet:  Not Applicable  Comments: Pt able to follow exercise prescription today without complaint.  Will continue to monitor for progression.   Dr. Emily Filbert is Medical Director for [place] and [facility].

## 2017-06-02 ENCOUNTER — Ambulatory Visit: Payer: Self-pay | Admitting: Internal Medicine

## 2017-06-03 DIAGNOSIS — J8410 Pulmonary fibrosis, unspecified: Secondary | ICD-10-CM

## 2017-06-03 NOTE — Progress Notes (Signed)
Daily Session Note  Patient Details  Name: Sherri Price MRN: [id number] Date of Birth: 12/19/1933 Referring Provider:     Pulmonary Rehab from 05/05/2017 in [facility] Cardiac and Pulmonary Rehab  Referring Provider  [place]      Encounter Date: 06/03/2017  Check In: Session Check In - 06/03/17 1012      Check-In   Location  [facility]-Cardiac & Pulmonary Rehab    Staff Present  Amanda Sommer, BA, ACSM CEP, Exercise Physiologist;Joseph Hood RCP,RRT,BSRT;Jessica Hawkins, MA, RCEP, CCRP, Exercise Physiologist    Supervising physician immediately available to respond to emergencies  [facility] immediately available ER MD    Physician(s)  Dr. Veronese and McShane    Medication changes reported      No    Fall or balance concerns reported     No    Warm-up and Cool-down  Performed as group-led instruction    Resistance Training Performed  Yes    VAD Patient?  No      Pain Assessment   Currently in Pain?  No/denies          Social History   Tobacco Use  Smoking Status Never Smoker  Smokeless Tobacco Never Used    Goals Met:  Independence with exercise equipment Exercise tolerated well No report of cardiac concerns or symptoms Strength training completed today  Goals Unmet:  Not Applicable  Comments: Pt able to follow exercise prescription today without complaint.  Will continue to monitor for progression.   Dr. Mark Miller is Medical Director for [facility] and [facility]. 

## 2017-06-05 DIAGNOSIS — J8410 Pulmonary fibrosis, unspecified: Secondary | ICD-10-CM | POA: Diagnosis not present

## 2017-06-05 NOTE — Progress Notes (Signed)
Daily Session Note  Patient Details  Name: Sherri Price MRN: [id number] Date of Birth: [birth year] Referring Provider:     Pulmonary Rehab from 05/05/2017 in [facility] Cardiac and Pulmonary Rehab  Referring Provider  Caryl Comes      Encounter Date: 06/05/2017  Check In: Session Check In - 06/05/17 0959      Check-In   Location  [facility]-Cardiac & Pulmonary Rehab    Staff Present  Justin Mend RCP,RRT,BSRT;Meredith Sherryll Burger, RN Vickki Hearing, BA, ACSM CEP, Exercise Physiologist    Supervising physician immediately available to respond to emergencies  [facility] immediately available ER MD    Physician(s)  Dr. Jodell Cipro and [facility]    Medication changes reported      No    Fall or balance concerns reported     No    Warm-up and Cool-down  Performed as group-led instruction    Resistance Training Performed  Yes    VAD Patient?  No      Pain Assessment   Currently in Pain?  No/denies          Social History   Tobacco Use  Smoking Status Never Smoker  Smokeless Tobacco Never Used    Goals Met:  Independence with exercise equipment Exercise tolerated well No report of cardiac concerns or symptoms Strength training completed today  Goals Unmet:  Not Applicable  Comments: Pt able to follow exercise prescription today without complaint.  Will continue to monitor for progression.   Dr. Emily Filbert is Medical Director for [place] and [facility].

## 2017-06-08 DIAGNOSIS — J8410 Pulmonary fibrosis, unspecified: Secondary | ICD-10-CM | POA: Diagnosis not present

## 2017-06-08 NOTE — Progress Notes (Signed)
Daily Session Note  Patient Details  Name: Sherri Price MRN: [id number] Date of Birth: 1934/02/24 Referring Provider:     Pulmonary Rehab from 05/05/2017 in [facility] Cardiac and Pulmonary Rehab  Referring Provider  Caryl Comes      Encounter Date: 06/08/2017  Check In: Session Check In - 06/08/17 1012      Check-In   Location  [facility]-Cardiac & Pulmonary Rehab    Staff Present  Nada Maclachlan, BA, ACSM CEP, Exercise Physiologist;Kelly Amedeo Plenty, BS, ACSM CEP, Exercise Physiologist;Janiyla Long Flavia Shipper    Supervising physician immediately available to respond to emergencies  [facility] immediately available ER MD    Physician(s)  Dr. Cinda Quest and [place]    Medication changes reported      No    Fall or balance concerns reported     No    Warm-up and Cool-down  Performed as group-led instruction    Resistance Training Performed  Yes    VAD Patient?  No      Pain Assessment   Currently in Pain?  No/denies          Social History   Tobacco Use  Smoking Status Never Smoker  Smokeless Tobacco Never Used    Goals Met:  Independence with exercise equipment Exercise tolerated well No report of cardiac concerns or symptoms Strength training completed today  Goals Unmet:  Not Applicable  Comments: Pt able to follow exercise prescription today without complaint.  Will continue to monitor for progression.   Dr. Emily Filbert is Medical Director for [place] and [facility].

## 2017-06-10 DIAGNOSIS — J8410 Pulmonary fibrosis, unspecified: Secondary | ICD-10-CM

## 2017-06-10 NOTE — Progress Notes (Signed)
Daily Session Note  Patient Details  Name: Sherri Price MRN: [id number] Date of Birth: [birth year] Referring Provider:     Pulmonary Rehab from 05/05/2017 in [facility] Cardiac and Pulmonary Rehab  Referring Provider  Caryl Comes      Encounter Date: 06/10/2017  Check In: Session Check In - 06/10/17 1002      Check-In   Location  [facility]-Cardiac & Pulmonary Rehab    Staff Present  Nada Maclachlan, BA, ACSM CEP, Exercise Physiologist;Amariana Mirando Darrin Nipper, [place], RCEP, CCRP, Exercise Physiologist    Supervising physician immediately available to respond to emergencies  [facility] immediately available ER MD    Physician(s)  Dr. Mariea Clonts and Jimmye Norman    Medication changes reported      No    Fall or balance concerns reported     No    Warm-up and Cool-down  Performed as group-led instruction    Resistance Training Performed  Yes    VAD Patient?  No      Pain Assessment   Currently in Pain?  No/denies          Social History   Tobacco Use  Smoking Status Never Smoker  Smokeless Tobacco Never Used    Goals Met:  Independence with exercise equipment Exercise tolerated well No report of cardiac concerns or symptoms Strength training completed today  Goals Unmet:  Not Applicable  Comments: Pt able to follow exercise prescription today without complaint.  Will continue to monitor for progression.   Dr. Emily Filbert is Medical Director for [place] and [facility].

## 2017-06-11 ENCOUNTER — Ambulatory Visit
Admission: RE | Admit: 2017-06-11 | Discharge: 2017-06-11 | Disposition: A | Discharge status: Home / Self Care | Payer: Medicare Other | Source: Ambulatory Visit | Attending: Internal Medicine | Admitting: Internal Medicine

## 2017-06-11 DIAGNOSIS — Z1231 Encounter for screening mammogram for malignant neoplasm of breast: Secondary | ICD-10-CM

## 2017-06-12 DIAGNOSIS — J8410 Pulmonary fibrosis, unspecified: Secondary | ICD-10-CM | POA: Diagnosis not present

## 2017-06-12 NOTE — Progress Notes (Signed)
Daily Session Note  Patient Details  Name: Sherri Price MRN: [id number] Date of Birth: [birth year] Referring Provider:     Pulmonary Rehab from 05/05/2017 in [facility] Cardiac and Pulmonary Rehab  Referring Provider  Caryl Comes      Encounter Date: 06/12/2017  Check In: Session Check In - 06/12/17 0953      Check-In   Location  [facility]-Cardiac & Pulmonary Rehab    Staff Present  Nada Maclachlan, BA, ACSM CEP, Exercise Physiologist;Mandi [place], BS, PEC;Layton Naves [employer] physician immediately available to respond to emergencies  [facility] immediately available ER MD    Physician(s)  Dr. Mariea Clonts and Jacqualine Code    Medication changes reported      No    Fall or balance concerns reported     No    Warm-up and Cool-down  Performed as group-led instruction    Resistance Training Performed  Yes    VAD Patient?  No      Pain Assessment   Currently in Pain?  No/denies          Social History   Tobacco Use  Smoking Status Never Smoker  Smokeless Tobacco Never Used    Goals Met:  Independence with exercise equipment Exercise tolerated well No report of cardiac concerns or symptoms Strength training completed today  Goals Unmet:  Not Applicable  Comments: Pt able to follow exercise prescription today without complaint.  Will continue to monitor for progression.   Dr. Emily Filbert is Medical Director for [place] and [facility].

## 2017-06-15 DIAGNOSIS — J8410 Pulmonary fibrosis, unspecified: Secondary | ICD-10-CM

## 2017-06-15 NOTE — Progress Notes (Signed)
Daily Session Note  Patient Details  Name: Sherri Price MRN: [id number] Date of Birth: [birth year] Referring Provider:     Pulmonary Rehab from 05/05/2017 in [facility] Cardiac and Pulmonary Rehab  Referring Provider  Caryl Comes      Encounter Date: 06/15/2017  Check In: Session Check In - 06/15/17 1022      Check-In   Location  [facility]-Cardiac & Pulmonary Rehab    Staff Present  Nada Maclachlan, BA, ACSM CEP, Exercise Physiologist;Kelly Amedeo Plenty, BS, ACSM CEP, Exercise Physiologist;Spike Desilets Flavia Shipper    Supervising physician immediately available to respond to emergencies  [facility] immediately available ER MD    Physician(s)  Dr. Lamar Laundry and Mariea Clonts    Medication changes reported      No    Fall or balance concerns reported     No    Warm-up and Cool-down  Performed as group-led instruction    Resistance Training Performed  Yes    VAD Patient?  No      Pain Assessment   Currently in Pain?  No/denies          Social History   Tobacco Use  Smoking Status Never Smoker  Smokeless Tobacco Never Used    Goals Met:  Independence with exercise equipment Exercise tolerated well No report of cardiac concerns or symptoms Strength training completed today  Goals Unmet:  Not Applicable  Comments: Pt able to follow exercise prescription today without complaint.  Will continue to monitor for progression.   Dr. Emily Filbert is Medical Director for [place] and [facility].

## 2017-06-17 DIAGNOSIS — J8410 Pulmonary fibrosis, unspecified: Secondary | ICD-10-CM | POA: Diagnosis not present

## 2017-06-17 NOTE — Progress Notes (Signed)
Daily Session Note  Patient Details  Name: Sherri Price MRN: [id number] Date of Birth: [birth year] Referring Provider:     Pulmonary Rehab from 05/05/2017 in [facility] Cardiac and Pulmonary Rehab  Referring Provider  Caryl Comes      Encounter Date: 06/17/2017  Check In: Session Check In - 06/17/17 1014      Check-In   Location  [facility]-Cardiac & Pulmonary Rehab    Staff Present  Nada Maclachlan, BA, ACSM CEP, Exercise Physiologist;Joseph Darrin Nipper, [place], RCEP, CCRP, Exercise Physiologist    Supervising physician immediately available to respond to emergencies  [facility] immediately available ER MD    Physician(s)  Dr. Jimmye Norman and Alfred Levins    Medication changes reported      No    Fall or balance concerns reported     No    Warm-up and Cool-down  Performed as group-led instruction    Resistance Training Performed  Yes    VAD Patient?  No      Pain Assessment   Currently in Pain?  No/denies          Social History   Tobacco Use  Smoking Status Never Smoker  Smokeless Tobacco Never Used    Goals Met:  Independence with exercise equipment Exercise tolerated well No report of cardiac concerns or symptoms Strength training completed today  Goals Unmet:  Not Applicable  Comments: Pt able to follow exercise prescription today without complaint.  Will continue to monitor for progression.   Dr. Emily Filbert is Medical Director for [place] and [facility].

## 2017-06-19 ENCOUNTER — Encounter: Payer: Medicare Other | Attending: Internal Medicine

## 2017-06-19 DIAGNOSIS — Z79890 Hormone replacement therapy (postmenopausal): Secondary | ICD-10-CM | POA: Insufficient documentation

## 2017-06-19 DIAGNOSIS — Z7901 Long term (current) use of anticoagulants: Secondary | ICD-10-CM | POA: Insufficient documentation

## 2017-06-19 DIAGNOSIS — Z79899 Other long term (current) drug therapy: Secondary | ICD-10-CM | POA: Insufficient documentation

## 2017-06-19 DIAGNOSIS — E039 Hypothyroidism, unspecified: Secondary | ICD-10-CM | POA: Insufficient documentation

## 2017-06-19 DIAGNOSIS — J8410 Pulmonary fibrosis, unspecified: Secondary | ICD-10-CM | POA: Insufficient documentation

## 2017-06-19 DIAGNOSIS — K219 Gastro-esophageal reflux disease without esophagitis: Secondary | ICD-10-CM | POA: Insufficient documentation

## 2017-06-22 DIAGNOSIS — K219 Gastro-esophageal reflux disease without esophagitis: Secondary | ICD-10-CM | POA: Diagnosis not present

## 2017-06-22 DIAGNOSIS — J8410 Pulmonary fibrosis, unspecified: Secondary | ICD-10-CM | POA: Diagnosis present

## 2017-06-22 DIAGNOSIS — Z7901 Long term (current) use of anticoagulants: Secondary | ICD-10-CM | POA: Diagnosis not present

## 2017-06-22 DIAGNOSIS — Z79890 Hormone replacement therapy (postmenopausal): Secondary | ICD-10-CM | POA: Diagnosis not present

## 2017-06-22 DIAGNOSIS — Z79899 Other long term (current) drug therapy: Secondary | ICD-10-CM | POA: Diagnosis not present

## 2017-06-22 DIAGNOSIS — E039 Hypothyroidism, unspecified: Secondary | ICD-10-CM | POA: Diagnosis not present

## 2017-06-22 NOTE — Progress Notes (Signed)
Daily Session Note  Patient Details  Name: Sherri Price MRN: [id number] Date of Birth: 11-Sep-1933 Referring Provider:     Pulmonary Rehab from 05/05/2017 in [facility] Cardiac and Pulmonary Rehab  Referring Provider  Caryl Comes      Encounter Date: 06/22/2017  Check In: Session Check In - 06/22/17 0957      Check-In   Location  [facility]-Cardiac & Pulmonary Rehab    Staff Present  Nada Maclachlan, BA, ACSM CEP, Exercise Physiologist;Kelly Amedeo Plenty, BS, ACSM CEP, Exercise Physiologist;Vermell Madrid Flavia Shipper    Supervising physician immediately available to respond to emergencies  [facility] immediately available ER MD    Physician(s)  Dr. Quentin Cornwall and Jimmye Norman    Medication changes reported      No    Fall or balance concerns reported     No    Warm-up and Cool-down  Performed as group-led instruction    Resistance Training Performed  Yes    VAD Patient?  No      Pain Assessment   Currently in Pain?  No/denies          Social History   Tobacco Use  Smoking Status Never Smoker  Smokeless Tobacco Never Used    Goals Met:  Independence with exercise equipment Exercise tolerated well No report of cardiac concerns or symptoms Strength training completed today  Goals Unmet:  Not Applicable  Comments: Pt able to follow exercise prescription today without complaint.  Will continue to monitor for progression.   Dr. Emily Filbert is Medical Director for [place] and [facility].

## 2017-06-24 DIAGNOSIS — J8410 Pulmonary fibrosis, unspecified: Secondary | ICD-10-CM | POA: Diagnosis not present

## 2017-06-24 NOTE — Progress Notes (Signed)
Daily Session Note  Patient Details  Name: Sherri Price MRN: [id number] Date of Birth: 09-12-1933 Referring Provider:     Pulmonary Rehab from 05/05/2017 in [facility] Cardiac and Pulmonary Rehab  Referring Provider  Caryl Comes      Encounter Date: 06/24/2017  Check In: Session Check In - 06/24/17 1024      Check-In   Location  [facility]-Cardiac & Pulmonary Rehab    Staff Present  Alberteen Sam, MA, RCEP, CCRP, Exercise Physiologist;Amanda Oletta Darter, [place], ACSM CEP, Exercise Physiologist;Hue Frick Flavia Shipper    Supervising physician immediately available to respond to emergencies  [facility] immediately available ER MD    Physician(s)  Jimmye Norman and Lucita Lora    Medication changes reported      No    Fall or balance concerns reported     No    Warm-up and Cool-down  Performed as group-led Higher education careers adviser Performed  Yes    VAD Patient?  No      Pain Assessment   Currently in Pain?  No/denies    Multiple Pain Sites  No          Social History   Tobacco Use  Smoking Status Never Smoker  Smokeless Tobacco Never Used    Goals Met:  Independence with exercise equipment Exercise tolerated well No report of cardiac concerns or symptoms Strength training completed today  Goals Unmet:  Not Applicable  Comments: Pt able to follow exercise prescription today without complaint.  Will continue to monitor for progression.    Dr. Emily Filbert is Medical Director for [place] and [facility].

## 2017-06-26 DIAGNOSIS — J8410 Pulmonary fibrosis, unspecified: Secondary | ICD-10-CM

## 2017-06-26 NOTE — Progress Notes (Signed)
Daily Session Note  Patient Details  Name: Sherri Price MRN: [id number] Date of Birth: 07/21/1933 Referring Provider:     Pulmonary Rehab from 05/05/2017 in [facility] Cardiac and Pulmonary Rehab  Referring Provider  [place]      Encounter Date: 06/26/2017  Check In: Session Check In - 06/26/17 1013      Check-In   Location  [facility]-Cardiac & Pulmonary Rehab    Staff Present  Amanda Sommer, BA, ACSM CEP, Exercise Physiologist;Meredith Craven, RN BSN;Joseph Hood RCP,RRT,BSRT    Supervising physician immediately available to respond to emergencies  [facility] immediately available ER MD    Physician(s)  Dr. Malinda and Lord    Medication changes reported      No    Fall or balance concerns reported     No    Warm-up and Cool-down  Performed as group-led instruction    Resistance Training Performed  Yes    VAD Patient?  No      Pain Assessment   Currently in Pain?  No/denies          Social History   Tobacco Use  Smoking Status Never Smoker  Smokeless Tobacco Never Used    Goals Met:  Independence with exercise equipment Exercise tolerated well No report of cardiac concerns or symptoms Strength training completed today  Goals Unmet:  Not Applicable  Comments: Pt able to follow exercise prescription today without complaint.  Will continue to monitor for progression.   Dr. Mark Miller is Medical Director for [facility] and [facility]. 

## 2017-06-29 DIAGNOSIS — J8410 Pulmonary fibrosis, unspecified: Secondary | ICD-10-CM

## 2017-06-29 NOTE — Progress Notes (Signed)
Daily Session Note  Patient Details  Name: Sherri Price MRN: [id number] Date of Birth: 09-27-1933 Referring Provider:     Pulmonary Rehab from 05/05/2017 in [facility] Cardiac and Pulmonary Rehab  Referring Provider  Caryl Comes      Encounter Date: 06/29/2017  Check In: Session Check In - 06/29/17 1024      Check-In   Location  [facility]-Cardiac & Pulmonary Rehab    Staff Present  Nada Maclachlan, BA, ACSM CEP, Exercise Physiologist;Kelly Amedeo Plenty, BS, ACSM CEP, Exercise Physiologist;Danasha Melman Flavia Shipper    Supervising physician immediately available to respond to emergencies  [facility] immediately available ER MD    Physician(s)  Dr. Corky Downs and [place]    Medication changes reported      No    Fall or balance concerns reported     No    Warm-up and Cool-down  Performed as group-led instruction    Resistance Training Performed  Yes    VAD Patient?  No      Pain Assessment   Currently in Pain?  No/denies          Social History   Tobacco Use  Smoking Status Never Smoker  Smokeless Tobacco Never Used    Goals Met:  Independence with exercise equipment Exercise tolerated well No report of cardiac concerns or symptoms Strength training completed today  Goals Unmet:  Not Applicable  Comments: Pt able to follow exercise prescription today without complaint.  Will continue to monitor for progression.   Dr. Emily Filbert is Medical Director for [place] and [facility].

## 2017-06-29 NOTE — Progress Notes (Signed)
Pulmonary Individual Treatment Plan  Patient Details  Name: Sherri Price MRN: [id number] Date of Birth: 1934/01/03 Referring Provider:     Pulmonary Rehab from 05/05/2017 in [facility] Re[facility]  Referring Provider  Caryl Comes      Initial Encounter Date:    Pulmonary Rehab from 05/05/2017 in [facility] Rehab  Date  05/05/17  Referring Provider  Caryl Comes      Visit Diagnosis: Pulmonary fibrosis ([place])  Patient's Home Medications on Admission:  Current Outpatient Medications:  .  Ascorbic Acid (VITAMIN C) 1000 MG tablet, Take 1,000 mg by mouth 2 (two) times daily., Disp: , Rfl:  .  Cholecalciferol (VITAMIN D3) 5000 UNITS CAPS, Take 1 capsule by mouth daily., Disp: , Rfl:  .  Coenzyme Q10 100 MG capsule, Take 100 mg by mouth daily. , Disp: , Rfl:  .  Cranberry Fruit 405 MG CAPS, Take 1 capsule by mouth 2 (two) times daily. , Disp: , Rfl:  .  Evening Primrose Oil 500 MG CAPS, Take 1 capsule by mouth 2 (two) times daily. , Disp: , Rfl:  .  FLUOCINOLONE ACETONIDE SCALP 4.27 % OIL, 1 application by Other route daily as needed (psoriasis on scalp). , Disp: , Rfl:  .  L-Lysine 1000 MG TABS, Take 1 tablet by mouth 2 (two) times daily. , Disp: , Rfl:  .  levothyroxine (LEVOXYL) 100 MCG tablet, Take 100 mcg by mouth daily before breakfast. , Disp: , Rfl:  .  Multiple Vitamin (MULTIVITAMIN WITH MINERALS) TABS tablet, Take 1 tablet by mouth daily., Disp: , Rfl:  .  NON FORMULARY, Take 1 tablet by mouth 2 (two) times daily. calcium hydroxyapatite, Disp: , Rfl:  .  NON FORMULARY, Take 1 capsule by mouth 2 (two) times daily. With lunch and dinner. lactate prophase papain bromein, Disp: , Rfl:  .  NON FORMULARY, Take 1 tablet by mouth 2 (two) times daily. Quercetine, Disp: , Rfl:  .  NON FORMULARY, Take 1 Dose by mouth daily. Mixes these 3 OTC triple fiber, fiber smart, psyllium in a cup of juice every morning, Disp: , Rfl:  .  NON FORMULARY, Take 1 capsule by mouth 2 (two)  times daily. Eye health with lutein, Disp: , Rfl:  .  omeprazole (PRILOSEC) 20 MG capsule, Take 20 mg by mouth daily. , Disp: , Rfl:  .  Probiotic Product (PROBIOTIC DAILY PO), Take 2 tablets by mouth at bedtime., Disp: , Rfl:  .  Red Yeast Rice Extract 600 MG CAPS, Take 1 capsule by mouth 2 (two) times daily. , Disp: , Rfl:  .  rivaroxaban (XARELTO) 20 MG TABS tablet, Take 20 mg by mouth 2 (two) times daily at 10 AM and 5 PM., Disp: , Rfl:  .  TURMERIC CURCUMIN PO, Take 1 capsule by mouth daily. Use. One bid, Disp: , Rfl:  .  vitamin E 400 UNIT capsule, Take 400 Units by mouth 2 (two) times daily. , Disp: , Rfl:   Past Medical History: Past Medical History:  Diagnosis Date  . Arthritis   . Bell's palsy   . Cough    CHRONIC  . GERD (gastroesophageal reflux disease)   . Hypothyroidism   . IBS (irritable bowel syndrome)   . Neuropathy   . Oxygen decrease    USES AT NIGHT  . Pulmonary fibrosis ([place])   . Shortness of breath dyspnea     Tobacco Use: Social History   Tobacco Use  Smoking Status Never Smoker  Smokeless Tobacco Never Used    Labs: Recent Review Flowsheet Data    There is no flowsheet data to display.       Pulmonary Assessment Scores: Pulmonary Assessment Scores    Row Name 05/05/17 1133 06/24/17 1242       ADL UCSD   ADL Phase  Entry  Mid    SOB Score total  55  74    Rest  0  0    Walk  3  2    Stairs  4  5    Bath  2  3    Dress  1  3    Shop  2  3      CAT Score   CAT Score  18  -      mMRC Score   mMRC Score  2  -       Pulmonary Function Assessment: Pulmonary Function Assessment - 05/05/17 1222      Breath   Bilateral Breath Sounds  Clear    Shortness of Breath  Fear of Shortness of Breath;Panic with Shortness of Breath;Limiting activity;Yes       Exercise Target Goals:    Exercise Program Goal: Individual exercise prescription set using results from initial 6 min walk test and THRR while considering  patient's activity  barriers and safety.    Exercise Prescription Goal: Initial exercise prescription builds to 30-45 minutes a day of aerobic activity, 2-3 days per week.  Home exercise guidelines will be given to patient during program as part of exercise prescription that the participant will acknowledge.  Activity Barriers & Risk Stratification:   6 Minute Walk: 6 Minute Walk    Row Name 05/05/17 1256         6 Minute Walk   Distance  880 feet     Walk Time  6 minutes     # of Rest Breaks  0     MPH  1.67     METS  1.51     RPE  12     Perceived Dyspnea   2     VO2 Peak  5.28     Symptoms  No     Resting HR  80 bpm     Resting BP  106/52     Resting Oxygen Saturation   92 %     Exercise Oxygen Saturation  during 6 min walk  91 %     Max Ex. HR  84 bpm     Max Ex. BP  116/54     2 Minute Post BP  96/54       Interval HR   1 Minute HR  81     2 Minute HR  84     3 Minute HR  83     4 Minute HR  82     5 Minute HR  84     6 Minute HR  82     2 Minute Post HR  76     Interval Heart Rate?  Yes       Interval Oxygen   Interval Oxygen?  Yes     Baseline Oxygen Saturation %  92 %     1 Minute Oxygen Saturation %  94 %     1 Minute Liters of Oxygen  3 L pulsed - own tank     2 Minute Oxygen Saturation %  94 %     2 Minute Liters of Oxygen  3 L     3 Minute Oxygen Saturation %  94 %     3 Minute Liters of Oxygen  3 L     4 Minute Oxygen Saturation %  92 %     4 Minute Liters of Oxygen  3 L     5 Minute Oxygen Saturation %  91 %     5 Minute Liters of Oxygen  3 L     6 Minute Oxygen Saturation %  93 %     6 Minute Liters of Oxygen  3 L     2 Minute Post Oxygen Saturation %  96 %     2 Minute Post Liters of Oxygen  3 L       Oxygen Initial Assessment: Oxygen Initial Assessment - 05/05/17 1144      Home Oxygen   Home Oxygen Device  Home Concentrator;E-Tanks    Sleep Oxygen Prescription  Continuous    Liters per minute  3    Home Exercise Oxygen Prescription  Continuous     Liters per minute  3    Home at Rest Exercise Oxygen Prescription  Continuous    Liters per minute  3    Compliance with Home Oxygen Use  Yes      Initial 6 min Walk   Oxygen Used  Continuous    Liters per minute  3      Program Oxygen Prescription   Program Oxygen Prescription  Continuous    Liters per minute  3      Intervention   Short Term Goals  To learn and exhibit compliance with exercise, home and travel O2 prescription;To learn and understand importance of maintaining oxygen saturations>88%;To learn and understand importance of monitoring SPO2 with pulse oximeter and demonstrate accurate use of the pulse oximeter.;To learn and demonstrate proper pursed lip breathing techniques or other breathing techniques.;To learn and demonstrate proper use of respiratory medications    Long  Term Goals  Exhibits compliance with exercise, home and travel O2 prescription;Verbalizes importance of monitoring SPO2 with pulse oximeter and return demonstration;Maintenance of O2 saturations>88%;Exhibits proper breathing techniques, such as pursed lip breathing or other method taught during program session;Compliance with respiratory medication;Demonstrates proper use of MDI's       Oxygen Re-Evaluation: Oxygen Re-Evaluation    Row Name 05/13/17 1105 06/08/17 1457           Program Oxygen Prescription   Program Oxygen Prescription  Continuous  Continuous      Liters per minute  4  4      Comments  Increased to 4L as she was at 82% on treadmill on 3L  -        Home Oxygen   Home Oxygen Device  Home Concentrator;E-Tanks  Home Concentrator;E-Tanks      Sleep Oxygen Prescription  Continuous  Continuous      Liters per minute  3  3      Home Exercise Oxygen Prescription  Continuous  Continuous      Liters per minute  3  3      Home at Rest Exercise Oxygen Prescription  Continuous  -      Liters per minute  3  3      Compliance with Home Oxygen Use  Yes  Yes        Goals/Expected Outcomes    Short Term Goals  To learn and understand importance of monitoring SPO2 with pulse oximeter and demonstrate accurate use  of the pulse oximeter.;To learn and demonstrate proper pursed lip breathing techniques or other breathing techniques.;To learn and understand importance of maintaining oxygen saturations>88%  To learn and understand importance of monitoring SPO2 with pulse oximeter and demonstrate accurate use of the pulse oximeter.;To learn and demonstrate proper pursed lip breathing techniques or other breathing techniques.;To learn and understand importance of maintaining oxygen saturations>88%;To learn and demonstrate proper use of respiratory medications      Long  Term Goals  Verbalizes importance of monitoring SPO2 with pulse oximeter and return demonstration;Maintenance of O2 saturations>88%;Exhibits proper breathing techniques, such as pursed lip breathing or other method taught during program session  Verbalizes importance of monitoring SPO2 with pulse oximeter and return demonstration;Maintenance of O2 saturations>88%;Exhibits proper breathing techniques, such as pursed lip breathing or other method taught during program session;Compliance with respiratory medication      Comments  Reviewed PLB technique with pt.  Talked about how it work and it's important to maintaining his exercise saturations.    Escarlet is taking her Advair everyday as prescribed and rinsing her mouth afterwards. She states she has not needed to use her albuterol rescue inhaler. She had some questions on when to take the medication. Informed her that if she cant catch her breath or is feeling tight that she should use it. She does not carry her rescue inhaler, informed her that she should keep it with her at all times.      Goals/Expected Outcomes  Short: Become more profiecient at using PLB.   Long: Become independent at using PLB.  Short: carry her rescue inhaler with her. Long: take her rescue inhaler independently.          Oxygen Discharge (Final Oxygen Re-Evaluation): Oxygen Re-Evaluation - 06/08/17 1457      Program Oxygen Prescription   Program Oxygen Prescription  Continuous    Liters per minute  4      Home Oxygen   Home Oxygen Device  Home Concentrator;E-Tanks    Sleep Oxygen Prescription  Continuous    Liters per minute  3    Home Exercise Oxygen Prescription  Continuous    Liters per minute  3    Liters per minute  3    Compliance with Home Oxygen Use  Yes      Goals/Expected Outcomes   Short Term Goals  To learn and understand importance of monitoring SPO2 with pulse oximeter and demonstrate accurate use of the pulse oximeter.;To learn and demonstrate proper pursed lip breathing techniques or other breathing techniques.;To learn and understand importance of maintaining oxygen saturations>88%;To learn and demonstrate proper use of respiratory medications    Long  Term Goals  Verbalizes importance of monitoring SPO2 with pulse oximeter and return demonstration;Maintenance of O2 saturations>88%;Exhibits proper breathing techniques, such as pursed lip breathing or other method taught during program session;Compliance with respiratory medication    Comments  Merri is taking her Advair everyday as prescribed and rinsing her mouth afterwards. She states she has not needed to use her albuterol rescue inhaler. She had some questions on when to take the medication. Informed her that if she cant catch her breath or is feeling tight that she should use it. She does not carry her rescue inhaler, informed her that she should keep it with her at all times.    Goals/Expected Outcomes  Short: carry her rescue inhaler with her. Long: take her rescue inhaler independently.       Initial Exercise Prescription: Initial Exercise Prescription - 05/05/17  1200      Date of Initial Exercise RX and Referring Provider   Date  05/05/17    Referring Provider  Caryl Comes      Oxygen   Oxygen  Intermittent at home - will  use continuous exercise    Liters  3      Treadmill   MPH  1.5    Grade  0    Minutes  15    METs  2.15      Recumbant Bike   Level  1    RPM  60    Watts  7    Minutes  15    METs  2.15      NuStep   Level  3    SPM  80    Minutes  15    METs  2.15      Prescription Details   Frequency (times per week)  3    Duration  Progress to 45 minutes of aerobic exercise without signs/symptoms of physical distress      Intensity   THRR 40-80% of Max Heartrate  104-127    Ratings of Perceived Exertion  11-13    Perceived Dyspnea  0-4      Resistance Training   Training Prescription  Yes    Weight  2 lb    Reps  10-15       Perform Capillary Blood Glucose checks as needed.  Exercise Prescription Changes: Exercise Prescription Changes    Row Name 05/25/17 1100 06/03/17 1400 06/17/17 1500         Response to Exercise   Blood Pressure (Admit)  -  120/60  108/54     Blood Pressure (Exit)  -  134/70  126/64     Heart Rate (Admit)  -  79 bpm  85 bpm     Heart Rate (Exercise)  -  82 bpm  80 bpm     Heart Rate (Exit)  -  65 bpm  79 bpm     Oxygen Saturation (Admit)  -  96 %  94 %     Oxygen Saturation (Exercise)  -  90 %  91 %     Oxygen Saturation (Exit)  -  99 %  99 %     Rating of Perceived Exertion (Exercise)  -  15  13     Perceived Dyspnea (Exercise)  -  3  3     Symptoms  -  none  none     Duration  -  Continue with 45 min of aerobic exercise without signs/symptoms of physical distress.  Progress to 45 minutes of aerobic exercise without signs/symptoms of physical distress     Intensity  -  THRR unchanged  THRR unchanged       Progression   Progression  -  Continue to progress workloads to maintain intensity without signs/symptoms of physical distress.  Continue to progress workloads to maintain intensity without signs/symptoms of physical distress.     Average METs  -  2.1  2.2       Resistance Training   Training Prescription  -  Yes  Yes     Weight  -  2 lb  2  lb     Reps  -  10-15  10-15       Oxygen   Oxygen  -  Continuous  Continuous     Liters  -  3  3  Treadmill   MPH  -  1.5  -     Grade  -  0  -     Minutes  -  15  -     METs  -  2.15  -       Recumbant Bike   Level  -  -  1     RPM  -  -  60     Watts  -  -  12     Minutes  -  -  15     METs  -  -  2.8       NuStep   Level  -  4  4     SPM  -  78  63     Minutes  -  15  15     METs  -  2.1  1.6       Home Exercise Plan   Plans to continue exercise at  [employer] (comment) [employer] or Therapist, occupational (comment) [employer] or Therapist, occupational (comment) [employer] or [employer]     Frequency  Add 1 additional day to program exercise sessions.  Add 1 additional day to program exercise sessions.  Add 1 additional day to program exercise sessions.     Initial Home Exercises Provided  05/25/17  05/25/17  05/25/17        Exercise Comments: Exercise Comments    Row Name 05/13/17 1104           Exercise Comments  First full day of exercise!  Patient was oriented to gym and equipment including functions, settings, policies, and procedures.  Patient's individual exercise prescription and treatment plan were reviewed.  All starting workloads were established based on the results of the 6 minute walk test done at initial orientation visit.  The plan for exercise progression was also introduced and progression will be customized based on patient's performance and goals.          Exercise Goals and Review: Exercise Goals    Row Name 05/05/17 1255             Exercise Goals   Increase Physical Activity  Yes       Intervention  Provide advice, education, support and counseling about physical activity/exercise needs.;Develop an individualized exercise prescription for aerobic and resistive training based on initial evaluation findings, risk stratification, comorbidities and participant's personal goals.       Expected Outcomes  Achievement of  increased cardiorespiratory fitness and enhanced flexibility, muscular endurance and strength shown through measurements of functional capacity and personal statement of participant.       Increase Strength and Stamina  Yes       Intervention  Provide advice, education, support and counseling about physical activity/exercise needs.;Develop an individualized exercise prescription for aerobic and resistive training based on initial evaluation findings, risk stratification, comorbidities and participant's personal goals.       Expected Outcomes  Achievement of increased cardiorespiratory fitness and enhanced flexibility, muscular endurance and strength shown through measurements of functional capacity and personal statement of participant.       Able to understand and use rate of perceived exertion (RPE) scale  Yes       Intervention  Provide education and explanation on how to use RPE scale       Expected Outcomes  Short Term: Able to use RPE daily in rehab to express subjective intensity level;Long Term:  Able to use RPE to guide intensity level when exercising independently       Able to understand and use Dyspnea scale  Yes       Intervention  Provide education and explanation on how to use Dyspnea scale       Expected Outcomes  Short Term: Able to use Dyspnea scale daily in rehab to express subjective sense of shortness of breath during exertion;Long Term: Able to use Dyspnea scale to guide intensity level when exercising independently       Knowledge and understanding of Target Heart Rate Range (THRR)  Yes       Intervention  Provide education and explanation of THRR including how the numbers were predicted and where they are located for reference       Expected Outcomes  Short Term: Able to state/look up THRR;Long Term: Able to use THRR to govern intensity when exercising independently;Short Term: Able to use daily as guideline for intensity in rehab       Able to check pulse independently  Yes        Intervention  Provide education and demonstration on how to check pulse in carotid and radial arteries.;Review the importance of being able to check your own pulse for safety during independent exercise       Expected Outcomes  Short Term: Able to explain why pulse checking is important during independent exercise;Long Term: Able to check pulse independently and accurately       Understanding of Exercise Prescription  Yes       Intervention  Provide education, explanation, and written materials on patient's individual exercise prescription       Expected Outcomes  Short Term: Able to explain program exercise prescription;Long Term: Able to explain home exercise prescription to exercise independently          Exercise Goals Re-Evaluation : Exercise Goals Re-Evaluation    Row Name 05/13/17 1104 05/25/17 1106 06/03/17 1457 06/17/17 1522       Exercise Goal Re-Evaluation   Exercise Goals Review  Knowledge and understanding of Target Heart Rate Range (THRR);Understanding of Exercise Prescription;Able to understand and use rate of perceived exertion (RPE) scale;Able to understand and use Dyspnea scale  Increase Physical Activity;Able to understand and use Dyspnea scale;Increase Strength and Stamina;Knowledge and understanding of Target Heart Rate Range (THRR);Able to understand and use rate of perceived exertion (RPE) scale;Able to check pulse independently  Increase Strength and Stamina;Increase Physical Activity;Able to understand and use rate of perceived exertion (RPE) scale;Able to understand and use Dyspnea scale  Increase Physical Activity;Increase Strength and Stamina;Understanding of Exercise Prescription    Comments  Reviewed RPE scale, THR and program prescription with pt today.  Pt voiced understanding and was given a copy of goals to take home  Dameshia previously exercised at the [facility] before needing O2during the day.  She plans to exercise at home one day per week starting now.  Staff gave  info about [employer].  Elysabeth is tolerating exercise well and has increased intensity on the NS.  Staff will continue to monitor.  Sallie continues to progress well with exercise.  Staff has suggested she increase to 3 lb weights and increase RB resistance.    Expected Outcomes  Short: Use RPE daily to regulate intensity.  Long: Follow program prescription in THR.  Short - Atia will add one day of exrecise per week.  Long - Adileny will maintain exercise on her own when she completes LW  Short -  Jaella will attend LW 3 days per week  Long - Annina will continue exercise on her own  Short - Marnette will continue to attend Long - Kasidee will maintain fitness on her own       Discharge Exercise Prescription (Final Exercise Prescription Changes): Exercise Prescription Changes - 06/17/17 1500      Response to Exercise   Blood Pressure (Admit)  108/54    Blood Pressure (Exit)  126/64    Heart Rate (Admit)  85 bpm    Heart Rate (Exercise)  80 bpm    Heart Rate (Exit)  79 bpm    Oxygen Saturation (Admit)  94 %    Oxygen Saturation (Exercise)  91 %    Oxygen Saturation (Exit)  99 %    Rating of Perceived Exertion (Exercise)  13    Perceived Dyspnea (Exercise)  3    Symptoms  none    Duration  Progress to 45 minutes of aerobic exercise without signs/symptoms of physical distress    Intensity  THRR unchanged      Progression   Progression  Continue to progress workloads to maintain intensity without signs/symptoms of physical distress.    Average METs  2.2      Resistance Training   Training Prescription  Yes    Weight  2 lb    Reps  10-15      Oxygen   Oxygen  Continuous    Liters  3      Recumbant Bike   Level  1    RPM  60    Watts  12    Minutes  15    METs  2.8      NuStep   Level  4    SPM  63    Minutes  15    METs  1.6      Home Exercise Plan   Plans to continue exercise at  [employer] (comment) [employer] or [employer]    Frequency  Add 1 additional day  to program exercise sessions.    Initial Home Exercises Provided  05/25/17       Nutrition:  Target Goals: Understanding of nutrition guidelines, daily intake of sodium <1544m, cholesterol <2075m calories 30% from fat and 7% or less from saturated fats, daily to have 5 or more servings of fruits and vegetables.  Biometrics: Pre Biometrics - 05/05/17 1255      Pre Biometrics   Height  4' 11.5" (1.511 m)    Weight  100 lb 8 oz (45.6 kg)    Waist Circumference  29 inches    Hip Circumference  35.75 inches    Waist to Hip Ratio  0.81 %    BMI (Calculated)  19.97        Nutrition Therapy Plan and Nutrition Goals: Nutrition Therapy & Goals - 06/03/17 1057      Nutrition Therapy   Diet  TLC    Protein (specify units)  10oz    Fiber  25 grams    Saturated Fats  12 max. grams    Fruits and Vegetables  4 servings/day    Sodium  2000 grams      Personal Nutrition Goals   Nutrition Goal  Try Premier Protein drink as an alternative to the Atkins shakes, consume these on a more regular basis    Personal Goal #2  Add healthy fats and full-fat yogurts to meals and snacks, and add an evening snack regularly to encourage weight  maintenance/ gain      Intervention Plan   Intervention  Prescribe, educate and counsel regarding individualized specific dietary modifications aiming towards targeted core components such as weight, hypertension, lipid management, diabetes, heart failure and other comorbidities.    Expected Outcomes  Short Term Goal: Understand basic principles of dietary content, such as calories, fat, sodium, cholesterol and nutrients.;Short Term Goal: A plan has been developed with personal nutrition goals set during dietitian appointment.;Long Term Goal: Adherence to prescribed nutrition plan.       Nutrition Assessments: Nutrition Assessments - 05/13/17 1213      MEDFICTS Scores   Pre Score  12       Nutrition Goals Re-Evaluation: Nutrition Goals Re-Evaluation    Row  Name 06/24/17 1103             Goals   Current Weight  100 lb 6.4 oz (45.5 kg)       Nutrition Goal  Gain some weight and try to inake more protien.       Comment  Shereece does not eat sweets often but will have some once in awhile. She has been trying to drink more protien shakes for weight gain.       Expected Outcome  Short: eat foods higher in protien to gain weight. Long: maintain eating a higher protien diet.          Nutrition Goals Discharge (Final Nutrition Goals Re-Evaluation): Nutrition Goals Re-Evaluation - 06/24/17 1103      Goals   Current Weight  100 lb 6.4 oz (45.5 kg)    Nutrition Goal  Gain some weight and try to inake more protien.    Comment  Merrit does not eat sweets often but will have some once in awhile. She has been trying to drink more protien shakes for weight gain.    Expected Outcome  Short: eat foods higher in protien to gain weight. Long: maintain eating a higher protien diet.       Psychosocial: Target Goals: Acknowledge presence or absence of significant depression and/or stress, maximize coping skills, provide positive support system. Participant is able to verbalize types and ability to use techniques and skills needed for reducing stress and depression.   Initial Review & Psychosocial Screening: Initial Psych Review & Screening - 05/05/17 1141      Initial Review   Current issues with  Current Sleep Concerns    Source of Stress Concerns  Chronic Illness      Family Dynamics   Good Support System?  Yes    Comments  Her husband is a good support system. Most of her family is in [place].      Barriers   Psychosocial barriers to participate in program  The patient should benefit from training in stress management and relaxation.      Screening Interventions   Interventions  Yes;Encouraged to exercise;Program counselor consult;Provide feedback about the scores to participant;To provide support and resources with identified psychosocial  needs    Expected Outcomes  Short Term goal: Identification and review with participant of any Quality of Life or Depression concerns found by scoring the questionnaire.;Long Term goal: The participant improves quality of Life and PHQ9 Scores as seen by post scores and/or verbalization of changes;Long Term Goal: Stressors or current issues are controlled or eliminated.;Short Term goal: Utilizing psychosocial counselor, staff and physician to assist with identification of specific Stressors or current issues interfering with healing process. Setting desired goal for each stressor or current issue identified.  Quality of Life Scores:  Scores of 19 and below usually indicate a poorer quality of life in these areas.  A difference of  2-3 points is a clinically meaningful difference.  A difference of 2-3 points in the total score of the Quality of Life Index has been associated with significant improvement in overall quality of life, self-image, physical symptoms, and general health in studies assessing change in quality of life.  PHQ-9: Recent Review Flowsheet Data    Depression screen [facility] 2/9 06/24/2017 05/05/2017 04/30/2017   Decreased Interest 1 3 0   Down, Depressed, Hopeless 1 0 0   PHQ - 2 Score 2 3 0   Altered sleeping 1 3 -   Tired, decreased energy 2 3 -   Change in appetite 0 0 -   Feeling bad or failure about yourself  0 0 -   Trouble concentrating 0 0 -   Moving slowly or fidgety/restless 0 0 -   Suicidal thoughts 0 0 -   PHQ-9 Score 5 9 -   Difficult doing work/chores Somewhat difficult Very difficult -     Interpretation of Total Score  Total Score Depression Severity:  1-4 = Minimal depression, 5-9 = Mild depression, 10-14 = Moderate depression, 15-19 = Moderately severe depression, 20-27 = Severe depression   Psychosocial Evaluation and Intervention: Psychosocial Evaluation - 05/13/17 1108      Psychosocial Evaluation & Interventions   Interventions  Encouraged to  exercise with the program and follow exercise prescription;Relaxation education    Comments  Counselor met with Ms. Brigitte Pulse [facility]) for initial psychosocial evaluation.  She is an 84 year old who has a diagnosis of pulmonary fibrosis.  She was just released from the hospital several weeks ago with complications related to this disease.  Vonetta has a strong support system with a spouse of 57 years; one of his sons and family close by and she is actively involved in her local church.  She also has (3) children who check in often and live in [place].  Shivani reports being healthy for the most part besides the pulmonary diagnosis.  She sleeps "fair" at night and has a good appetite.  She denies a history or current symptoms of depression or anxiety and states she is generally in a positive mood most of the time.  Dailin has goals to breathe better and increase her stamina/strength overall while in this program.  Staff will follow with Sharnika throughout the course of this program.      Expected Outcomes  Brightyn will benefit from consistent exercise to achieve her stated goals.  The educational and psychoeducational components of this program will be helpful in understanding and coping better with her condition.         Psychosocial Re-Evaluation: Psychosocial Re-Evaluation    Row Name 06/08/17 1451 06/24/17 1107           Psychosocial Re-Evaluation   Current issues with  Current Stress Concerns  Current Stress Concerns;Current Sleep Concerns      Comments  Alvita is doing well in [place]. Wearing oxygen 24 hours a day is taking its toll on her. I informed her that with her disease oxygen is something she needs to have and that it is a drug that she requires. She informed us that she is taking her medications regularly and has not needed to use her rescue inhaler. Informed her that she is doing all that she can to help with her situation and doing [facility] is an  important aspect for her to get better.   Jose takes meletonin to help her sleep at night. She was recently diagnosed with Lung Cancer. Her doctor told her that they cannot do anything about it. She has a good outlook on her diagnosis. She says she is willing to do whatever it takes to make it through this. Her husband is there for her and makes it alot easier to deal with her condition.      Expected Outcomes  Short: attend [facility] to reduce stress. Long: Maintain an exercise program to help with her disease and stress.  Short: talk with her doctor about options. Long: live life one day at a time.      Interventions  Encouraged to attend Pulmonary Rehabilitation for the exercise  Encouraged to attend Pulmonary Rehabilitation for the exercise      Continue Psychosocial Services   Follow up required by staff  Follow up required by staff         Psychosocial Discharge (Final Psychosocial Re-Evaluation): Psychosocial Re-Evaluation - 06/24/17 1107      Psychosocial Re-Evaluation   Current issues with  Current Stress Concerns;Current Sleep Concerns    Comments  Statia takes meletonin to help her sleep at night. She was recently diagnosed with Lung Cancer. Her doctor told her that they cannot do anything about it. She has a good outlook on her diagnosis. She says she is willing to do whatever it takes to make it through this. Her husband is there for her and makes it alot easier to deal with her condition.    Expected Outcomes  Short: talk with her doctor about options. Long: live life one day at a time.    Interventions  Encouraged to attend Pulmonary Rehabilitation for the exercise    Continue Psychosocial Services   Follow up required by staff       Education: Education Goals: Education classes will be provided on a weekly basis, covering required topics. Participant will state understanding/return demonstration of topics presented.  Learning Barriers/Preferences: Learning Barriers/Preferences - 05/05/17 1144      Learning  Barriers/Preferences   Learning Barriers  Sight wears glasses    Learning Preferences  None       Education Topics:  Initial Evaluation Education: - Verbal, written and demonstration of respiratory meds, oximetry and breathing techniques. Instruction on use of nebulizers and MDIs and importance of monitoring MDI activations.   Pulmonary Rehab from 06/22/2017 in [facility] Cardiac and Pulmonary Rehab  Date  05/05/17  Educator  [facility]  Instruction Review Code  1- Verbalizes Understanding      General Nutrition Guidelines/Fats and Fiber: -Group instruction provided by verbal, written material, models and posters to present the general guidelines for heart healthy nutrition. Gives an explanation and review of dietary fats and fiber.   Pulmonary Rehab from 06/22/2017 in [facility] Cardiac and Pulmonary Rehab  Date  06/15/17  Educator  CR  Instruction Review Code  1- Verbalizes Understanding      Controlling Sodium/Reading Food Labels: -Group verbal and written material supporting the discussion of sodium use in heart healthy nutrition. Review and explanation with models, verbal and written materials for utilization of the food label.   Pulmonary Rehab from 06/22/2017 in [facility] Cardiac and Pulmonary Rehab  Date  06/22/17  Educator  PI  Instruction Review Code  1- Verbalizes Understanding      Exercise Physiology & General Exercise Guidelines: - Group verbal and written instruction with models to review the exercise physiology of  the cardiovascular system and associated critical values. Provides general exercise guidelines with specific guidelines to those with heart or lung disease.    Pulmonary Rehab from 06/22/2017 in [facility] Cardiac and Pulmonary Rehab  Date  05/22/17  Educator  [facility]  Instruction Review Code  1- Verbalizes Understanding      Aerobic Exercise & Resistance Training: - Gives group verbal and written instruction on the various components of exercise. Focuses on aerobic and resistive training  programs and the benefits of this training and how to safely progress through these programs.   Pulmonary Rehab from 06/22/2017 in [facility] Cardiac and Pulmonary Rehab  Date  06/05/17  Educator  AS  Instruction Review Code  1- Verbalizes Understanding      Flexibility, Balance, Mind/Body Relaxation: Provides group verbal/written instruction on the benefits of flexibility and balance training, including mind/body exercise modes such as yoga, pilates and tai chi.  Demonstration and skill practice provided.   Stress and Anxiety: - Provides group verbal and written instruction about the health risks of elevated stress and causes of high stress.  Discuss the correlation between heart/lung disease and anxiety and treatment options. Review healthy ways to manage with stress and anxiety.   Depression: - Provides group verbal and written instruction on the correlation between heart/lung disease and depressed mood, treatment options, and the stigmas associated with seeking treatment.   Exercise & Equipment Safety: - Individual verbal instruction and demonstration of equipment use and safety with use of the equipment.   Pulmonary Rehab from 06/22/2017 in [facility] Cardiac and Pulmonary Rehab  Date  05/05/17  Educator  [facility]  Instruction Review Code  1- Verbalizes Understanding      Infection Prevention: - Provides verbal and written material to individual with discussion of infection control including proper hand washing and proper equipment cleaning during exercise session.   Pulmonary Rehab from 06/22/2017 in [facility] Cardiac and Pulmonary Rehab  Date  05/05/17  Educator  [facility]  Instruction Review Code  1- Verbalizes Understanding      Falls Prevention: - Provides verbal and written material to individual with discussion of falls prevention and safety.   Pulmonary Rehab from 06/22/2017 in [facility] Cardiac and Pulmonary Rehab  Date  05/05/17  Educator  [facility]  Instruction Review Code  1- Verbalizes Understanding       Diabetes: - Individual verbal and written instruction to review signs/symptoms of diabetes, desired ranges of glucose level fasting, after meals and with exercise. Advice that pre and post exercise glucose checks will be done for 3 sessions at entry of program.   Chronic Lung Diseases: - Group verbal and written instruction to review updates, respiratory medications, advancements in procedures and treatments. Discuss use of supplemental oxygen including available portable oxygen systems, continuous and intermittent flow rates, concentrators, personal use and safety guidelines. Review proper use of inhaler and spacers. Provide informative websites for self-education.    Pulmonary Rehab from 06/22/2017 in [facility] Cardiac and Pulmonary Rehab  Date  05/20/17  Educator  [facility]  Instruction Review Code  1- Verbalizes Understanding      Energy Conservation: - Provide group verbal and written instruction for methods to conserve energy, plan and organize activities. Instruct on pacing techniques, use of adaptive equipment and posture/positioning to relieve shortness of breath.   Triggers and Exacerbations: - Group verbal and written instruction to review types of environmental triggers and ways to prevent exacerbations. Discuss weather changes, air quality and the benefits of nasal washing. Review warning signs and symptoms to  help prevent infections. Discuss techniques for effective airway clearance, coughing, and vibrations.   Pulmonary Rehab from 06/22/2017 in [facility] Cardiac and Pulmonary Rehab  Date  06/03/17  Educator  [facility]  Instruction Review Code  1- Verbalizes Understanding      AED/CPR: - Group verbal and written instruction with the use of models to demonstrate the basic use of the AED with the basic ABC's of resuscitation.   Anatomy and Physiology of the Lungs: - Group verbal and written instruction with the use of models to provide basic lung anatomy and physiology related to function,  structure and complications of lung disease.   Anatomy & Physiology of the Heart: - Group verbal and written instruction and models provide basic cardiac anatomy and physiology, with the coronary electrical and arterial systems. Review of Valvular disease and Heart Failure   Pulmonary Rehab from 06/22/2017 in [facility] Cardiac and Pulmonary Rehab  Date  06/17/17  Educator  [facility]  Instruction Review Code  1- Verbalizes Understanding      Cardiac Medications: - Group verbal and written instruction to review commonly prescribed medications for heart disease. Reviews the medication, class of the drug, and side effects.   Know Your Numbers and Risk Factors: -Group verbal and written instruction about important numbers in your health.  Discussion of what are risk factors and how they play a role in the disease process.  Review of Cholesterol, Blood Pressure, Diabetes, and BMI and the role they play in your overall health.   Pulmonary Rehab from 06/22/2017 in [facility] Cardiac and Pulmonary Rehab  Date  05/27/17  Educator  [facility]  Instruction Review Code  1- Verbalizes Understanding      Sleep Hygiene: -Provides group verbal and written instruction about how sleep can affect your health.  Define sleep hygiene, discuss sleep cycles and impact of sleep habits. Review good sleep hygiene tips.    Other: -Provides group and verbal instruction on various topics (see comments)   Pulmonary Rehab from 06/22/2017 in [facility] Cardiac and Pulmonary Rehab  Date  06/10/17  Educator  [facility]  Instruction Review Code  1- Verbalizes Understanding [SLEEP]       Knowledge Questionnaire Score: Knowledge Questionnaire Score - 05/05/17 1152      Knowledge Questionnaire Score   Pre Score  16/18 reviewd with patient.        Core Components/Risk Factors/Patient Goals at Admission: Personal Goals and Risk Factors at Admission - 05/05/17 1146      Core Components/Risk Factors/Patient Goals on Admission    Weight Management  Weight  Maintenance;Yes    Intervention  Weight Management: Develop a combined nutrition and exercise program designed to reach desired caloric intake, while maintaining appropriate intake of nutrient and fiber, sodium and fats, and appropriate energy expenditure required for the weight goal.;Weight Management/Obesity: Establish reasonable short term and long term weight goals.;Weight Management: Provide education and appropriate resources to help participant work on and attain dietary goals.    Admit Weight  100 lb 8 oz (45.6 kg)    Goal Weight: Short Term  100 lb (45.4 kg)    Goal Weight: Long Term  100 lb (45.4 kg)    Expected Outcomes  Short Term: Continue to assess and modify interventions until short term weight is achieved;Long Term: Adherence to nutrition and physical activity/exercise program aimed toward attainment of established weight goal;Weight Maintenance: Understanding of the daily nutrition guidelines, which includes 25-35% calories from fat, 7% or less cal from saturated fats, less than 2103m cholesterol, less  than 1.5gm of sodium, & 5 or more servings of fruits and vegetables daily    Improve shortness of breath with ADL's  Yes    Intervention  Provide education, individualized exercise plan and daily activity instruction to help decrease symptoms of SOB with activities of daily living.    Expected Outcomes  Short Term: Achieves a reduction of symptoms when performing activities of daily living.       Core Components/Risk Factors/Patient Goals Review:  Goals and Risk Factor Review    Row Name 06/24/17 1058             Core Components/Risk Factors/Patient Goals Review   Personal Goals Review  Weight Management/Obesity;Improve shortness of breath with ADL's       Review  Camani states that she cannot do housework like she used to. When she cooks and does some dishes she does not get too short of breath. She focuses on not walking too fast so she does not get short of breath.        Expected Outcomes  Short: gain some weight, improve her ADLS with exercise. Long: maintain weight gain.          Core Components/Risk Factors/Patient Goals at Discharge (Final Review):  Goals and Risk Factor Review - 06/24/17 1058      Core Components/Risk Factors/Patient Goals Review   Personal Goals Review  Weight Management/Obesity;Improve shortness of breath with ADL's    Review  Leshae states that she cannot do housework like she used to. When she cooks and does some dishes she does not get too short of breath. She focuses on not walking too fast so she does not get short of breath.    Expected Outcomes  Short: gain some weight, improve her ADLS with exercise. Long: maintain weight gain.       ITP Comments: ITP Comments    Row Name 05/05/17 1102 05/15/17 1027 06/01/17 0822 06/22/17 1053 06/29/17 0918   ITP Comments  Medical Evaluation completed. Chart sent for review and changes to Dr. Emily Filbert Director of [place]. Diagnosis can be found in [facility] encounter 05/05/17  Patient needs to use 6 liters when exercising. Her oxygen on 4 liters was below 85% on the treadmill. Will send note to Dr. Lequita Halt for an Oxygen order for 6 liters.  30 day review completed. ITP sent to Dr. Emily Filbert Director of [place]. Continue with ITP unless changes are made by physician.  Audelia Acton stated that her Doctors appointment did not go well on Friday and that she has Cancer of the Lung. She states that there is nothing they could do for it.  30 day review completed. ITP sent to Dr. Emily Filbert Director of [place]. Continue with ITP unless changes are made by physician.      Comments: 30 day review

## 2017-07-01 DIAGNOSIS — J8410 Pulmonary fibrosis, unspecified: Secondary | ICD-10-CM

## 2017-07-01 NOTE — Progress Notes (Signed)
Daily Session Note  Patient Details  Name: Sherri Price MRN: [id number] Date of Birth: 07/27/1933 Referring Provider:     Pulmonary Rehab from 05/05/2017 in [facility] Cardiac and Pulmonary Rehab  Referring Provider  [place]lein      Encounter Date: 07/01/2017  Check In: Session Check In - 07/01/17 1006      Check-In   Location  [facility]-Cardiac & Pulmonary Rehab    Staff Present  Jessica Hawkins, MA, RCEP, CCRP, Exercise Physiologist;Amanda Sommer, BA, ACSM CEP, Exercise Physiologist;Joseph Hood RCP,RRT,BSRT    Supervising physician immediately available to respond to emergencies  [facility] immediately available ER MD    Physician(s)  Dr. McShane and [place]    Medication changes reported      No    Fall or balance concerns reported     No    Warm-up and Cool-down  Performed as group-led instruction    Resistance Training Performed  Yes    VAD Patient?  No      Pain Assessment   Currently in Pain?  No/denies          Social History   Tobacco Use  Smoking Status Never Smoker  Smokeless Tobacco Never Used    Goals Met:  Independence with exercise equipment Exercise tolerated well No report of cardiac concerns or symptoms Strength training completed today  Goals Unmet:  Not Applicable  Comments: Pt able to follow exercise prescription today without complaint.  Will continue to monitor for progression.   Dr. Mark Miller is Medical Director for [facility] and [facility]. 

## 2017-07-03 ENCOUNTER — Encounter: Payer: Medicare Other | Admitting: *Deleted

## 2017-07-03 DIAGNOSIS — J8410 Pulmonary fibrosis, unspecified: Secondary | ICD-10-CM

## 2017-07-03 NOTE — Progress Notes (Signed)
Daily Session Note  Patient Details  Name: Sherri Price MRN: [id number] Date of Birth: June 07, 1933 Referring Provider:     Pulmonary Rehab from 05/05/2017 in [facility] Cardiac and Pulmonary Rehab  Referring Provider  Caryl Comes      Encounter Date: 07/03/2017  Check In: Session Check In - 07/03/17 1027      Check-In   Location  [facility]-Cardiac & Pulmonary Rehab    Staff Present  Renita Papa, RN BSN;Joseph Foy [place], [place], ACSM CEP, Exercise Physiologist    Supervising physician immediately available to respond to emergencies  [facility] immediately available ER MD    Physician(s)  Dr. Jacqualine Code and Quentin Cornwall    Medication changes reported      No    Fall or balance concerns reported     No    Warm-up and Cool-down  Performed as group-led instruction    Resistance Training Performed  Yes    VAD Patient?  No      Pain Assessment   Currently in Pain?  No/denies          Social History   Tobacco Use  Smoking Status Never Smoker  Smokeless Tobacco Never Used    Goals Met:  Proper associated with RPD/PD & O2 Sat Independence with exercise equipment Using PLB without cueing & demonstrates good technique Exercise tolerated well Strength training completed today  Goals Unmet:  Not Applicable  Comments: Pt able to follow exercise prescription today without complaint.  Will continue to monitor for progression.    Dr. Emily Filbert is Medical Director for [place] and [facility].

## 2017-07-06 DIAGNOSIS — J8410 Pulmonary fibrosis, unspecified: Secondary | ICD-10-CM | POA: Diagnosis not present

## 2017-07-06 NOTE — Progress Notes (Signed)
Daily Session Note  Patient Details  Name: Sherri Price MRN: [id number] Date of Birth: 24-Feb-1934 Referring Provider:     Pulmonary Rehab from 05/05/2017 in [facility] Cardiac and Pulmonary Rehab  Referring Provider  Caryl Comes      Encounter Date: 07/06/2017  Check In: Session Check In - 07/06/17 1019      Check-In   Location  [facility]-Cardiac & Pulmonary Rehab    Staff Present  Earlean Shawl, BS, ACSM CEP, Exercise Physiologist;Amanda Oletta Darter, BA, ACSM CEP, Exercise Physiologist;Annis Lagoy Flavia Shipper    Supervising physician immediately available to respond to emergencies  [facility] immediately available ER MD    Physician(s)  Jimmye Norman and Alfred Levins    Medication changes reported      No    Fall or balance concerns reported     No    Tobacco Cessation  No Change    Warm-up and Cool-down  Performed as group-led instruction    Resistance Training Performed  Yes    VAD Patient?  No      Pain Assessment   Currently in Pain?  No/denies          Social History   Tobacco Use  Smoking Status Never Smoker  Smokeless Tobacco Never Used    Goals Met:  Independence with exercise equipment Exercise tolerated well No report of cardiac concerns or symptoms Strength training completed today  Goals Unmet:  Not Applicable  Comments: Pt able to follow exercise prescription today without complaint.  Will continue to monitor for progression.   Dr. Emily Filbert is Medical Director for [place] and [facility].

## 2017-07-08 DIAGNOSIS — J8410 Pulmonary fibrosis, unspecified: Secondary | ICD-10-CM | POA: Diagnosis not present

## 2017-07-08 NOTE — Progress Notes (Signed)
Daily Session Note  Patient Details  Name: Sherri Price MRN: [id number] Date of Birth: 03/11/1934 Referring Provider:     Pulmonary Rehab from 05/05/2017 in [facility] Cardiac and Pulmonary Rehab  Referring Provider  Caryl Comes      Encounter Date: 07/08/2017  Check In: Session Check In - 07/08/17 1016      Check-In   Location  [facility]-Cardiac & Pulmonary Rehab    Staff Present  Nada Maclachlan, BA, ACSM CEP, Exercise Physiologist;Jessica Luan Pulling, MA, RCEP, CCRP, Exercise Physiologist;Kwadwo Taras Flavia Shipper    Supervising physician immediately available to respond to emergencies  [facility] immediately available ER MD    Physician(s)  Dr. Reita Cliche and Quentin Cornwall    Medication changes reported      No    Fall or balance concerns reported     No    Warm-up and Cool-down  Performed as group-led instruction    Resistance Training Performed  Yes    VAD Patient?  No      Pain Assessment   Currently in Pain?  No/denies          Social History   Tobacco Use  Smoking Status Never Smoker  Smokeless Tobacco Never Used    Goals Met:  Independence with exercise equipment Exercise tolerated well No report of cardiac concerns or symptoms Strength training completed today  Goals Unmet:  Not Applicable  Comments: Pt able to follow exercise prescription today without complaint.  Will continue to monitor for progression.   Dr. Emily Filbert is Medical Director for [place] and [facility].

## 2017-07-10 DIAGNOSIS — J8410 Pulmonary fibrosis, unspecified: Secondary | ICD-10-CM | POA: Diagnosis not present

## 2017-07-10 NOTE — Progress Notes (Signed)
Daily Session Note  Patient Details  Name: Sherri Price MRN: [id number] Date of Birth: 06/28/1933 Referring Provider:     Pulmonary Rehab from 05/05/2017 in [facility] Cardiac and Pulmonary Rehab  Referring Provider  Caryl Comes      Encounter Date: 07/10/2017  Check In: Session Check In - 07/10/17 1029      Check-In   Location  [facility]-Cardiac & Pulmonary Rehab    Staff Present  Renita Papa, RN BSN;Mandi [place], BS, PEC;Aubriel Khanna [place]    Supervising physician immediately available to respond to emergencies  [facility] immediately available ER MD    Physician(s)  Dr. Jacqualine Code and [facility]    Medication changes reported      No    Fall or balance concerns reported     No    Warm-up and Cool-down  Performed as group-led instruction    Resistance Training Performed  Yes      Pain Assessment   Currently in Pain?  No/denies          Social History   Tobacco Use  Smoking Status Never Smoker  Smokeless Tobacco Never Used    Goals Met:  Independence with exercise equipment Exercise tolerated well No report of cardiac concerns or symptoms Strength training completed today  Goals Unmet:  Not Applicable  Comments: Pt able to follow exercise prescription today without complaint.  Will continue to monitor for progression.   Dr. Emily Filbert is Medical Director for [place] and [facility].

## 2017-07-13 DIAGNOSIS — J8410 Pulmonary fibrosis, unspecified: Secondary | ICD-10-CM

## 2017-07-13 NOTE — Progress Notes (Signed)
Daily Session Note  Patient Details  Name: Sherri Price MRN: [id number] Date of Birth: 07/17/1933 Referring Provider:     Pulmonary Rehab from 05/05/2017 in [facility] Cardiac and Pulmonary Rehab  Referring Provider  Caryl Comes      Encounter Date: 07/13/2017  Check In: Session Check In - 07/13/17 1116      Check-In   Location  [facility]Cardiac & Pulmonary Rehab    Staff Present  Earlean Shawl, BS, ACSM CEP, Exercise Physiologist;Amanda Oletta Darter, BA, ACSM CEP, Exercise Physiologist;Karey Stucki Flavia Shipper    Supervising physician immediately available to respond to emergencies  [facility] immediately available ER MD    Physician(s)  Dr. Reita Cliche and Mariea Clonts    Medication changes reported      No    Fall or balance concerns reported     No    Warm-up and Cool-down  Performed as group-led instruction    Resistance Training Performed  Yes    VAD Patient?  No      Pain Assessment   Currently in Pain?  No/denies          Social History   Tobacco Use  Smoking Status Never Smoker  Smokeless Tobacco Never Used    Goals Met:  Independence with exercise equipment Exercise tolerated well No report of cardiac concerns or symptoms Strength training completed today  Goals Unmet:  Not Applicable  Comments: Pt able to follow exercise prescription today without complaint.  Will continue to monitor for progression.   Dr. Emily Filbert is Medical Director for [place] and [facility].

## 2017-07-15 DIAGNOSIS — J8410 Pulmonary fibrosis, unspecified: Secondary | ICD-10-CM

## 2017-07-15 NOTE — Progress Notes (Signed)
Daily Session Note  Patient Details  Name: Sherri Price MRN: [id number] Date of Birth: 22-Mar-1934 Referring Provider:     Pulmonary Rehab from 05/05/2017 in [facility] Cardiac and Pulmonary Rehab  Referring Provider  Caryl Comes      Encounter Date: 07/15/2017  Check In: Session Check In - 07/15/17 1110      Check-In   Location  [facility]Cardiac & Pulmonary Rehab    Staff Present  Justin Mend Lorre Nick, MA, RCEP, CCRP, Exercise Physiologist;Amanda Oletta Darter, [place], ACSM CEP, Exercise Physiologist    Supervising physician immediately available to respond to emergencies  [facility] immediately available ER MD    Physician(s)  Dr. Reita Cliche and Jimmye Norman    Medication changes reported      No    Fall or balance concerns reported     No    Tobacco Cessation  No Change    Warm-up and Cool-down  Performed as group-led instruction    Resistance Training Performed  Yes    VAD Patient?  No      Pain Assessment   Currently in Pain?  No/denies          Social History   Tobacco Use  Smoking Status Never Smoker  Smokeless Tobacco Never Used    Goals Met:  Independence with exercise equipment Exercise tolerated well No report of cardiac concerns or symptoms Strength training completed today  Goals Unmet:  Not Applicable  Comments: Pt able to follow exercise prescription today without complaint.  Will continue to monitor for progression.   Dr. Emily Filbert is Medical Director for [facility] and [facility].

## 2017-07-17 ENCOUNTER — Encounter: Payer: Medicare Other | Attending: Internal Medicine | Admitting: *Deleted

## 2017-07-17 DIAGNOSIS — Z79899 Other long term (current) drug therapy: Secondary | ICD-10-CM | POA: Insufficient documentation

## 2017-07-17 DIAGNOSIS — J8410 Pulmonary fibrosis, unspecified: Secondary | ICD-10-CM | POA: Diagnosis not present

## 2017-07-17 DIAGNOSIS — Z7901 Long term (current) use of anticoagulants: Secondary | ICD-10-CM | POA: Insufficient documentation

## 2017-07-17 DIAGNOSIS — Z79890 Hormone replacement therapy (postmenopausal): Secondary | ICD-10-CM | POA: Insufficient documentation

## 2017-07-17 DIAGNOSIS — E039 Hypothyroidism, unspecified: Secondary | ICD-10-CM | POA: Diagnosis not present

## 2017-07-17 DIAGNOSIS — K219 Gastro-esophageal reflux disease without esophagitis: Secondary | ICD-10-CM | POA: Diagnosis not present

## 2017-07-17 NOTE — Progress Notes (Signed)
Daily Session Note  Patient Details  Name: Sherri Price MRN: [id number] Date of Birth: 04/27/1934 Referring Provider:     Pulmonary Rehab from 05/05/2017 in [facility] Cardiac and Pulmonary Rehab  Referring Provider  Caryl Comes      Encounter Date: 07/17/2017  Check In: Session Check In - 07/17/17 1048      Check-In   Location  [facility]Cardiac & Pulmonary Rehab    Staff Present  Renita Papa, RN Vickki Hearing, BA, ACSM CEP, Exercise Physiologist;Krista Frederico Hamman, RN BSN    Supervising physician immediately available to respond to emergencies  [facility] immediately available ER MD    Physician(s)  Dr. Kerman Passey and Clearnce Hasten    Medication changes reported      No    Fall or balance concerns reported     No    Tobacco Cessation  No Change    Warm-up and Cool-down  Performed as group-led instruction    Resistance Training Performed  Yes    VAD Patient?  No      Pain Assessment   Currently in Pain?  No/denies        Exercise Prescription Changes - 07/17/17 0800      Home Exercise Plan   Plans to continue exercise at  [facility] (comment) [employer] or [employer]    Frequency  Add 1 additional day to program exercise sessions.    Initial Home Exercises Provided  05/25/17       Social History   Tobacco Use  Smoking Status Never Smoker  Smokeless Tobacco Never Used    Goals Met:  Proper associated with RPD/PD & O2 Sat Independence with exercise equipment Using PLB without cueing & demonstrates good technique Exercise tolerated well Strength training completed today  Goals Unmet:  Not Applicable  Comments: Pt able to follow exercise prescription today without complaint.  Will continue to monitor for progression.    Dr. Emily Filbert is Medical Director for [place] and [facility].

## 2017-07-20 ENCOUNTER — Encounter: Payer: Medicare Other | Admitting: *Deleted

## 2017-07-20 DIAGNOSIS — J8410 Pulmonary fibrosis, unspecified: Secondary | ICD-10-CM | POA: Diagnosis not present

## 2017-07-20 NOTE — Progress Notes (Signed)
Daily Session Note  Patient Details  Name: Sherri Price MRN: [id number] Date of Birth: [birth year] Referring Provider:     Pulmonary Rehab from 05/05/2017 in [facility] Cardiac and Pulmonary Rehab  Referring Provider  Caryl Comes      Encounter Date: 07/20/2017  Check In: Session Check In - 07/20/17 1013      Check-In   Location  [facility] Pulmonary Re[facility]    Staff Present  Nada Maclachlan, BA, ACSM CEP, Exercise Physiologist;Kelly Amedeo Plenty, BS, ACSM CEP, Exercise Physiologist;Joseph Flavia Shipper    Supervising physician immediately available to respond to emergencies  [facility] immediately available ER MD    Physician(s)  Dr. Burlene Arnt and Jimmye Norman    Medication changes reported      No    Fall or balance concerns reported     No    Warm-up and Cool-down  Performed as group-led instruction    Resistance Training Performed  Yes    VAD Patient?  No      Pain Assessment   Currently in Pain?  No/denies          Social History   Tobacco Use  Smoking Status Never Smoker  Smokeless Tobacco Never Used    Goals Met:  Independence with exercise equipment Using PLB without cueing & demonstrates good technique Exercise tolerated well Personal goals reviewed No report of cardiac concerns or symptoms Strength training completed today  Goals Unmet:  Not Applicable  Comments: Pt able to follow exercise prescription today without complaint.  Will continue to monitor for progression.    Dr. Emily Filbert is Medical Director for [place] and [facility].

## 2017-07-21 ENCOUNTER — Telehealth: Payer: Self-pay

## 2017-07-21 NOTE — Telephone Encounter (Signed)
Called patient to advise her of her next CT Chest for March. Patient stated that she has transferred her pulmonary care to [facility] with Dr Wells Guilesorry/Titania

## 2017-07-22 DIAGNOSIS — J8410 Pulmonary fibrosis, unspecified: Secondary | ICD-10-CM

## 2017-07-22 NOTE — Progress Notes (Signed)
Daily Session Note  Patient Details  Name: Sherri Price MRN: [id number] Date of Birth: [birth year] Referring Provider:     Pulmonary Rehab from 05/05/2017 in [facility] Cardiac and Pulmonary Rehab  Referring Provider  Caryl Comes      Encounter Date: 07/22/2017  Check In: Session Check In - 07/22/17 1010      Check-In   Location  [facility]Cardiac & Pulmonary Rehab    Staff Present  Justin Mend RCP,RRT,BSRT;Amanda Oletta Darter, BA, ACSM CEP, Exercise Physiologist;Jessica Luan Pulling, [place], RCEP, CCRP, Exercise Physiologist    Supervising physician immediately available to respond to emergencies  [facility] immediately available ER MD    Physician(s)  Dr. Joni Fears and Jimmye Norman    Medication changes reported      No    Fall or balance concerns reported     No    Tobacco Cessation  No Change    Warm-up and Cool-down  Performed as group-led instruction    Resistance Training Performed  Yes    VAD Patient?  No      Pain Assessment   Currently in Pain?  No/denies          Social History   Tobacco Use  Smoking Status Never Smoker  Smokeless Tobacco Never Used    Goals Met:  Independence with exercise equipment Exercise tolerated well No report of cardiac concerns or symptoms Strength training completed today  Goals Unmet:  Not Applicable  Comments: Pt able to follow exercise prescription today without complaint.  Will continue to monitor for progression.   Dr. Emily Filbert is Medical Director for [place] and [facility].

## 2017-07-27 DIAGNOSIS — J8410 Pulmonary fibrosis, unspecified: Secondary | ICD-10-CM

## 2017-07-27 NOTE — Progress Notes (Signed)
Daily Session Note  Patient Details  Name: Sherri Price MRN: [id number] Date of Birth: December 15, 1933 Referring Provider:     Pulmonary Rehab from 05/05/2017 in [facility] Cardiac and Pulmonary Rehab  Referring Provider  Caryl Comes      Encounter Date: 07/27/2017  Check In: Session Check In - 07/27/17 1006      Check-In   Location  [facility]Cardiac [facility] Pulmonary Rehab    Staff Present  Justin Mend RCP,RRT,BSRT;Amanda Oletta Darter, BA, ACSM CEP, Exercise Physiologist;Kelly Amedeo Plenty, BS, ACSM CEP, Exercise Physiologist    Supervising physician immediately available to respond to emergencies  [facility] immediately available ER MD    Physician(s)  Dr. Corky Downs and Jimmye Norman    Medication changes reported      No    Fall or balance concerns reported     No    Tobacco Cessation  Use Decreased    Warm-up and Cool-down  Performed as group-led instruction    Resistance Training Performed  Yes    VAD Patient?  No      Pain Assessment   Currently in Pain?  No/denies          Social History   Tobacco Use  Smoking Status Never Smoker  Smokeless Tobacco Never Used    Goals Met:  Independence with exercise equipment Exercise tolerated well No report of cardiac concerns or symptoms Strength training completed today  Goals Unmet:  Not Applicable  Comments: Pt able to follow exercise prescription today without complaint.  Will continue to monitor for progression.   Dr. Emily Filbert is Medical Director for [place] and [facility].

## 2017-07-27 NOTE — Progress Notes (Signed)
Pulmonary Individual Treatment Plan  Patient Details  Name: Sherri Price MRN: [id number] Date of Birth: 12/09/1933 Referring Provider:     Pulmonary Rehab from 05/05/2017 in [facility] Cardiac and Pulmonary Rehab  Referring Provider  Caryl Comes      Initial Encounter Date:    Pulmonary Rehab from 05/05/2017 in [facility] Cardiac and Pulmonary Rehab  Date  05/05/17  Referring Provider  Caryl Comes      Visit Diagnosis: Pulmonary fibrosis ([place])  Patient's Home Medications on Admission:  Current Outpatient Medications:  .  Ascorbic Acid (VITAMIN C) 1000 MG tablet, Take 1,000 mg by mouth 2 (two) times daily., Disp: , Rfl:  .  Cholecalciferol (VITAMIN D3) 5000 UNITS CAPS, Take 1 capsule by mouth daily., Disp: , Rfl:  .  Coenzyme Q10 100 MG capsule, Take 100 mg by mouth daily. , Disp: , Rfl:  .  Cranberry Fruit 405 MG CAPS, Take 1 capsule by mouth 2 (two) times daily. , Disp: , Rfl:  .  Evening Primrose Oil 500 MG CAPS, Take 1 capsule by mouth 2 (two) times daily. , Disp: , Rfl:  .  FLUOCINOLONE ACETONIDE SCALP 8.33 % OIL, 1 application by Other route daily as needed (psoriasis on scalp). , Disp: , Rfl:  .  L-Lysine 1000 MG TABS, Take 1 tablet by mouth 2 (two) times daily. , Disp: , Rfl:  .  levothyroxine (LEVOXYL) 100 MCG tablet, Take 100 mcg by mouth daily before breakfast. , Disp: , Rfl:  .  Multiple Vitamin (MULTIVITAMIN WITH MINERALS) TABS tablet, Take 1 tablet by mouth daily., Disp: , Rfl:  .  NON FORMULARY, Take 1 tablet by mouth 2 (two) times daily. calcium hydroxyapatite, Disp: , Rfl:  .  NON FORMULARY, Take 1 capsule by mouth 2 (two) times daily. With lunch and dinner. lactate prophase papain bromein, Disp: , Rfl:  .  NON FORMULARY, Take 1 tablet by mouth 2 (two) times daily. Quercetine, Disp: , Rfl:  .  NON FORMULARY, Take 1 Dose by mouth daily. Mixes these 3 OTC triple fiber, fiber smart, psyllium in a cup of juice every morning, Disp: , Rfl:  .  NON FORMULARY, Take 1 capsule by mouth 2 (two)  times daily. Eye health with lutein, Disp: , Rfl:  .  omeprazole (PRILOSEC) 20 MG capsule, Take 20 mg by mouth daily. , Disp: , Rfl:  .  Probiotic Product (PROBIOTIC DAILY PO), Take 2 tablets by mouth at bedtime., Disp: , Rfl:  .  Red Yeast Rice Extract 600 MG CAPS, Take 1 capsule by mouth 2 (two) times daily. , Disp: , Rfl:  .  rivaroxaban (XARELTO) 20 MG TABS tablet, Take 20 mg by mouth 2 (two) times daily at 10 AM and 5 PM., Disp: , Rfl:  .  TURMERIC CURCUMIN PO, Take 1 capsule by mouth daily. Use. One bid, Disp: , Rfl:  .  vitamin E 400 UNIT capsule, Take 400 Units by mouth 2 (two) times daily. , Disp: , Rfl:   Past Medical History: Past Medical History:  Diagnosis Date  . Arthritis   . Bell's palsy   . Cough    CHRONIC  . GERD (gastroesophageal reflux disease)   . Hypothyroidism   . IBS (irritable bowel syndrome)   . Neuropathy   . Oxygen decrease    USES AT NIGHT  . Pulmonary fibrosis ([place])   . Shortness of breath dyspnea     Tobacco Use: Social History   Tobacco Use  Smoking Status Never Smoker  Smokeless Tobacco Never Used    Labs: Recent Review Flowsheet Data    There is no flowsheet data to display.       Pulmonary Assessment Scores: Pulmonary Assessment Scores    Row Name 05/05/17 1133 06/24/17 1242 07/22/17 1205     ADL UCSD   ADL Phase  Entry  Mid  Exit   SOB Score total  55  74  65   Rest  0  0  3   Walk  _0 Stairs  _1 Bath  _2 Dress  _3 Shop  _4 CAT Score   CAT Score  18  -  21     mMRC Score   mMRC Score  2  -  -      Pulmonary Function Assessment: Pulmonary Function Assessment - 05/05/17 1222      Breath   Bilateral Breath Sounds  Clear    Shortness of Breath  Fear of Shortness of Breath;Panic with Shortness of Breath;Limiting activity;Yes       Exercise Target Goals:    Exercise Program Goal: Individual exercise prescription set using results from initial 6 min walk test and THRR while  considering  patient's activity barriers and safety.    Exercise Prescription Goal: Initial exercise prescription builds to 30-45 minutes a day of aerobic activity, 2-3 days per week.  Home exercise guidelines will be given to patient during program as part of exercise prescription that the participant will acknowledge.  Activity Barriers & Risk Stratification:   6 Minute Walk: 6 Minute Walk    Row Name 05/05/17 1256         6 Minute Walk   Distance  880 feet     Walk Time  6 minutes     # of Rest Breaks  0     MPH  1.67     METS  1.51     RPE  12     Perceived Dyspnea   2     VO2 Peak  5.28     Symptoms  No     Resting HR  80 bpm     Resting BP  106/52     Resting Oxygen Saturation   92 %     Exercise Oxygen Saturation  during 6 min walk  91 %     Max Ex. HR  84 bpm     Max Ex. BP  116/54     2 Minute Post BP  96/54       Interval HR   1 Minute HR  81     2 Minute HR  84     3 Minute HR  83     4 Minute HR  82     5 Minute HR  84     6 Minute HR  82     2 Minute Post HR  76     Interval Heart Rate?  Yes       Interval Oxygen   Interval Oxygen?  Yes     Baseline Oxygen Saturation %  92 %     1 Minute Oxygen Saturation %  94 %     1 Minute Liters of Oxygen  3 L pulsed - own tank     2 Minute Oxygen Saturation %  94 %  2 Minute Liters of Oxygen  3 L     3 Minute Oxygen Saturation %  94 %     3 Minute Liters of Oxygen  3 L     4 Minute Oxygen Saturation %  92 %     4 Minute Liters of Oxygen  3 L     5 Minute Oxygen Saturation %  91 %     5 Minute Liters of Oxygen  3 L     6 Minute Oxygen Saturation %  93 %     6 Minute Liters of Oxygen  3 L     2 Minute Post Oxygen Saturation %  96 %     2 Minute Post Liters of Oxygen  3 L       Oxygen Initial Assessment: Oxygen Initial Assessment - 05/05/17 1144      Home Oxygen   Home Oxygen Device  Home Concentrator;E-Tanks    Sleep Oxygen Prescription  Continuous    Liters per minute  3    Home Exercise Oxygen  Prescription  Continuous    Liters per minute  3    Home at Rest Exercise Oxygen Prescription  Continuous    Liters per minute  3    Compliance with Home Oxygen Use  Yes      Initial 6 min Walk   Oxygen Used  Continuous    Liters per minute  3      Program Oxygen Prescription   Program Oxygen Prescription  Continuous    Liters per minute  3      Intervention   Short Term Goals  To learn and exhibit compliance with exercise, home and travel O2 prescription;To learn and understand importance of maintaining oxygen saturations>88%;To learn and understand importance of monitoring SPO2 with Price oximeter and demonstrate accurate use of the Price oximeter.;To learn and demonstrate proper pursed lip breathing techniques or other breathing techniques.;To learn and demonstrate proper use of respiratory medications    Long  Term Goals  Exhibits compliance with exercise, home and travel O2 prescription;Verbalizes importance of monitoring SPO2 with Price oximeter and return demonstration;Maintenance of O2 saturations>88%;Exhibits proper breathing techniques, such as pursed lip breathing or other method taught during program session;Compliance with respiratory medication;Demonstrates proper use of MDI's       Oxygen Re-Evaluation: Oxygen Re-Evaluation    Row Name 05/13/17 1105 06/08/17 1457 07/20/17 1118         Program Oxygen Prescription   Program Oxygen Prescription  Continuous  Continuous  Continuous     Liters per minute  _0 Comments  Increased to 4L as she was at 82% on treadmill on 3L  -  -       Home Oxygen   Home Oxygen Device  Home Concentrator;E-Tanks  Home Concentrator;E-Tanks  Home Concentrator;E-Tanks     Sleep Oxygen Prescription  Continuous  Continuous  Continuous     Liters per minute  _1 Home Exercise Oxygen Prescription  Continuous  Continuous  Continuous     Liters per minute  _2 Home at Rest Exercise Oxygen Prescription  Continuous  -   Continuous     Liters per minute  _3 Compliance with Home Oxygen Use  Yes  Yes  Yes       Goals/Expected Outcomes   Short  Term Goals  To learn and understand importance of monitoring SPO2 with Price oximeter and demonstrate accurate use of the Price oximeter.;To learn and demonstrate proper pursed lip breathing techniques or other breathing techniques.;To learn and understand importance of maintaining oxygen saturations>88%  To learn and understand importance of monitoring SPO2 with Price oximeter and demonstrate accurate use of the Price oximeter.;To learn and demonstrate proper pursed lip breathing techniques or other breathing techniques.;To learn and understand importance of maintaining oxygen saturations>88%;To learn and demonstrate proper use of respiratory medications  To learn and understand importance of monitoring SPO2 with Price oximeter and demonstrate accurate use of the Price oximeter.;To learn and demonstrate proper pursed lip breathing techniques or other breathing techniques.;To learn and understand importance of maintaining oxygen saturations>88%;To learn and demonstrate proper use of respiratory medications     Long  Term Goals  Verbalizes importance of monitoring SPO2 with Price oximeter and return demonstration;Maintenance of O2 saturations>88%;Exhibits proper breathing techniques, such as pursed lip breathing or other method taught during program session  Verbalizes importance of monitoring SPO2 with Price oximeter and return demonstration;Maintenance of O2 saturations>88%;Exhibits proper breathing techniques, such as pursed lip breathing or other method taught during program session;Compliance with respiratory medication  Exhibits compliance with exercise, home and travel O2 prescription;Verbalizes importance of monitoring SPO2 with Price oximeter and return demonstration;Maintenance of O2 saturations>88%;Exhibits proper breathing techniques, such as pursed lip breathing or  other method taught during program session;Compliance with respiratory medication     Comments  Reviewed PLB technique with pt.  Talked about how it work and it's important to maintaining his exercise saturations.    Tachina is taking her Advair everyday as prescribed and rinsing her mouth afterwards. She states she has not needed to use her albuterol rescue inhaler. She had some questions on when to take the medication. Informed her that if she cant catch her breath or is feeling tight that she should use it. She does not carry her rescue inhaler, informed her that she should keep it with her at all times.  Sherri Price's doctor took her off of Advair a couple weeks ago because he doesn't think it is helping. She is going to ask him to go back on it to see if it will help since she has taken a break and has noticed minor changes.      Goals/Expected Outcomes  Short: Become more profiecient at using PLB.   Long: Become independent at using PLB.  Short: carry her rescue inhaler with her. Long: take her rescue inhaler independently.  Short: have a conversation with her doctor about restarting her Advair. Long: continue to practice PLB, wear her oxygen accordingly. and stay on top of her appointments        Oxygen Discharge (Final Oxygen Re-Evaluation): Oxygen Re-Evaluation - 07/20/17 1118      Program Oxygen Prescription   Program Oxygen Prescription  Continuous    Liters per minute  4      Home Oxygen   Home Oxygen Device  Home Concentrator;E-Tanks    Sleep Oxygen Prescription  Continuous    Liters per minute  3    Home Exercise Oxygen Prescription  Continuous    Liters per minute  3    Home at Rest Exercise Oxygen Prescription  Continuous    Liters per minute  3    Compliance with Home Oxygen Use  Yes      Goals/Expected Outcomes   Short Term Goals  To learn and understand importance of monitoring  SPO2 with Price oximeter and demonstrate accurate use of the Price oximeter.;To learn and demonstrate  proper pursed lip breathing techniques or other breathing techniques.;To learn and understand importance of maintaining oxygen saturations>88%;To learn and demonstrate proper use of respiratory medications    Long  Term Goals  Exhibits compliance with exercise, home and travel O2 prescription;Verbalizes importance of monitoring SPO2 with Price oximeter and return demonstration;Maintenance of O2 saturations>88%;Exhibits proper breathing techniques, such as pursed lip breathing or other method taught during program session;Compliance with respiratory medication    Comments  Sherri Price doctor took her off of Advair a couple weeks ago because he doesn't think it is helping. She is going to ask him to go back on it to see if it will help since she has taken a break and has noticed minor changes.     Goals/Expected Outcomes  Short: have a conversation with her doctor about restarting her Advair. Long: continue to practice PLB, wear her oxygen accordingly. and stay on top of her appointments       Initial Exercise Prescription: Initial Exercise Prescription - 05/05/17 1200      Date of Initial Exercise RX and Referring Provider   Date  05/05/17    Referring Provider  Caryl Comes      Oxygen   Oxygen  Intermittent at home - will use continuous exercise    Liters  3      Treadmill   MPH  1.5    Grade  0    Minutes  15    METs  2.15      Recumbant Bike   Level  1    RPM  60    Watts  7    Minutes  15    METs  2.15      NuStep   Level  3    SPM  80    Minutes  15    METs  2.15      Prescription Details   Frequency (times per week)  3    Duration  Progress to 45 minutes of aerobic exercise without signs/symptoms of physical distress      Intensity   THRR 40-80% of Max Heartrate  104-127    Ratings of Perceived Exertion  11-13    Perceived Dyspnea  0-4      Resistance Training   Training Prescription  Yes    Weight  2 lb    Reps  10-15       Perform Capillary Blood Glucose checks as  needed.  Exercise Prescription Changes: Exercise Prescription Changes    Row Name 05/25/17 1100 06/03/17 1400 06/17/17 1500 07/01/17 1300 07/17/17 0700     Response to Exercise   Blood Pressure (Admit)  -  120/60  108/54  106/72  122/58   Blood Pressure (Exit)  -  134/70  126/64  122/74  110/62   Heart Rate (Admit)  -  79 bpm  85 bpm  -  80 bpm   Heart Rate (Exercise)  -  82 bpm  80 bpm  84 bpm  85 bpm   Heart Rate (Exit)  -  65 bpm  79 bpm  71 bpm  68 bpm   Oxygen Saturation (Admit)  -  96 %  94 %  -  91 %   Oxygen Saturation (Exercise)  -  90 %  91 %  88 %  88 %   Oxygen Saturation (Exit)  -  99 %  99 %  99 %  96 %   Rating of Perceived Exertion (Exercise)  -  _0 Perceived Dyspnea (Exercise)  -  _1 Symptoms  -  none  none  none  none   Duration  -  Continue with 45 min of aerobic exercise without signs/symptoms of physical distress.  Progress to 45 minutes of aerobic exercise without signs/symptoms of physical distress  Continue with 45 min of aerobic exercise without signs/symptoms of physical distress.  Continue with 45 min of aerobic exercise without signs/symptoms of physical distress.   Intensity  -  THRR unchanged  THRR unchanged  THRR unchanged  THRR unchanged     Progression   Progression  -  Continue to progress workloads to maintain intensity without signs/symptoms of physical distress.  Continue to progress workloads to maintain intensity without signs/symptoms of physical distress.  Continue to progress workloads to maintain intensity without signs/symptoms of physical distress.  Continue to progress workloads to maintain intensity without signs/symptoms of physical distress.   Average METs  -  2.1  2.2  1.9  2.2     Resistance Training   Training Prescription  -  Yes  Yes  Yes  Yes   Weight  -  2 lb  2 lb  2 lb  2 lb   Reps  -  10-15  10-15  10-15  10-15     Interval Training   Interval Training  -  -  -  No  No     Oxygen   Oxygen  -   Continuous  Continuous  Continuous  Continuous   Liters  -  _2 2-4 L     Treadmill   MPH  -  1.5  -  1.5  -   Grade  -  0  -  0  -   Minutes  -  15  -  15  -   METs  -  2.15  -  2.15  -     Recumbant Bike   Level  -  -  1  -  2   RPM  -  -  60  -  60   Watts  -  -  12  -  13   Minutes  -  -  15  -  15   METs  -  -  2.8  -  2.8     NuStep   Level  -  _3 SPM  -  78  63  74  64   Minutes  -  _4 METs  -  2.1  1.6  1.7  1.6     Home Exercise Plan   Plans to continue exercise at  [employer] (comment) [employer] or [facility]rapist, occupational (comment) [employer] or [facility]rap[facility], occupational (comment) [employer] or [place] 1 additional day to program exercise sessions.  Add 1 additional day to program exercise sessions.  Add 1 additional day to program exercise sessions.  -  -   Initial Home Exercises Provided  05/25/17  05/25/17  05/25/17  -  -   Row Name 07/17/17 0800             Home  Exercise Plan   Plans to continue exercise at  [facility] (comment) [employer] or [place]       Frequency  Add 1 additional day to program exercise sessions.       Initial Home Exercises Provided  05/25/17          Exercise Comments: Exercise Comments    Row Name 05/13/17 1104           Exercise Comments  First full day of exercise!  Patient was oriented to gym and equipment including functions, settings, policies, and procedures.  Patient's individual exercise prescription and treatment plan were reviewed.  All starting workloads were established based on the results of the 6 minute walk test done at initial orientation visit.  The plan for exercise progression was also introduced and progression will be customized based on patient's performance and goals.          Exercise Goals and Review: Exercise Goals    Row Name 05/05/17 1255             Exercise Goals   Increase Physical Activity  Yes       Intervention   Provide advice, education, support and counseling about physical activity/exercise needs.;Develop an individualized exercise prescription for aerobic and resistive training based on initial evaluation findings, risk stratification, comorbidities and participant's personal goals.       Expected Outcomes  Achievement of increased cardiorespiratory fitness and enhanced flexibility, muscular endurance and strength shown through measurements of functional capacity and personal statement of participant.       Increase Strength and Stamina  Yes       Intervention  Provide advice, education, support and counseling about physical activity/exercise needs.;Develop an individualized exercise prescription for aerobic and resistive training based on initial evaluation findings, risk stratification, comorbidities and participant's personal goals.       Expected Outcomes  Achievement of increased cardiorespiratory fitness and enhanced flexibility, muscular endurance and strength shown through measurements of functional capacity and personal statement of participant.       Able to understand and use rate of perceived exertion (RPE) scale  Yes       Intervention  Provide education and explanation on how to use RPE scale       Expected Outcomes  Short Term: Able to use RPE daily in rehab to express subjective intensity level;Long Term:  Able to use RPE to guide intensity level when exercising independently       Able to understand and use Dyspnea scale  Yes       Intervention  Provide education and explanation on how to use Dyspnea scale       Expected Outcomes  Short Term: Able to use Dyspnea scale daily in rehab to express subjective sense of shortness of breath during exertion;Long Term: Able to use Dyspnea scale to guide intensity level when exercising independently       Knowledge and understanding of Target Heart Rate Range (THRR)  Yes       Intervention  Provide education and explanation of THRR including how the  numbers were predicted and where they are located for reference       Expected Outcomes  Short Term: Able to state/look up THRR;Long Term: Able to use THRR to govern intensity when exercising independently;Short Term: Able to use daily as guideline for intensity in rehab       Able to check Price independently  Yes       Intervention  Provide education  and demonstration on how to check Price in carotid and radial arteries.;Review the importance of being able to check your own Price for safety during independent exercise       Expected Outcomes  Short Term: Able to explain why Price checking is important during independent exercise;Long Term: Able to check Price independently and accurately       Understanding of Exercise Prescription  Yes       Intervention  Provide education, explanation, and written materials on patient's individual exercise prescription       Expected Outcomes  Short Term: Able to explain program exercise prescription;Long Term: Able to explain home exercise prescription to exercise independently          Exercise Goals Re-Evaluation : Exercise Goals Re-Evaluation    Row Name 05/13/17 1104 05/25/17 1106 06/03/17 1457 06/17/17 1522 07/01/17 1332     Exercise Goal Re-Evaluation   Exercise Goals Review  Knowledge and understanding of Target Heart Rate Range (THRR);Understanding of Exercise Prescription;Able to understand and use rate of perceived exertion (RPE) scale;Able to understand and use Dyspnea scale  Increase Physical Activity;Able to understand and use Dyspnea scale;Increase Strength and Stamina;Knowledge and understanding of Target Heart Rate Range (THRR);Able to understand and use rate of perceived exertion (RPE) scale;Able to check Price independently  Increase Strength and Stamina;Increase Physical Activity;Able to understand and use rate of perceived exertion (RPE) scale;Able to understand and use Dyspnea scale  Increase Physical Activity;Increase Strength and  Stamina;Understanding of Exercise Prescription  Increase Physical Activity;Increase Strength and Stamina;Able to understand and use Dyspnea scale;Able to understand and use rate of perceived exertion (RPE) scale   Comments  Reviewed RPE scale, THR and program prescription with pt today.  Pt voiced understanding and was given a copy of goals to take home  Sherri Price previously exercised at the [facility] before needing O2during the day.  She plans to exercise at home one day per week starting now.  Staff gave info about [employer].  Sherri Price is tolerating exercise well and has increased intensity on the NS.  Staff will continue to monitor.  Sherri Price continues to progress well with exercise.  Staff has suggested she increase to 3 lb weights and increase RB resistance.  Sherri Price is reaching target RPE but not HR levels.  Staff will encourage small increases  as tolerated.   Expected Outcomes  Short: Use RPE daily to regulate intensity.  Long: Follow program prescription in THR.  Short - Anzal will add one day of exrecise per week.  Long - Varina will maintain exercise on her own when she completes [place] will attend LW 3 days per week  Long - Jhanae will continue exercise on her own  Short - Jariah will continue to attend Long - Yachet will maintain fitness on her own  [place] will attend regularly.  Long - Taler will build CV endurance   Row Name 07/17/17 0803             Exercise Goal Re-Evaluation   Exercise Goals Review  Increase Physical Activity;Able to understand and use rate of perceived exertion (RPE) scale;Increase Strength and Stamina;Able to understand and use Dyspnea scale       Comments  Randi is maintaining her current MET level of 2.2.  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          Expected Outcomes  Short - Estrellita will finish LW Long - Tayloranne will  cntinue exercise in CARE and on her own          Discharge Exercise Prescription (Final  Exercise Prescription Changes): Exercise Prescription Changes - 07/17/17 0800      Home Exercise Plan   Plans to continue exercise at  [facility] (comment) [employer] or [place]    Frequency  Add 1 additional day to program exercise sessions.    Initial Home Exercises Provided  05/25/17       Nutrition:  Target Goals: Understanding of nutrition guidelines, daily intake of sodium <1559m, cholesterol <208m calories 30% from fat and 7% or less from saturated fats, daily to have 5 or more servings of fruits and vegetables.  Biometrics: Pre Biometrics - 05/05/17 1255      Pre Biometrics   Height  4' 11.5" (1.511 m)    Weight  100 lb 8 oz (45.6 kg)    Waist Circumference  29 inches    Hip Circumference  35.75 inches    Waist to Hip Ratio  0.81 %    BMI (Calculated)  19.97        Nutrition Therapy Plan and Nutrition Goals: Nutrition Therapy & Goals - 06/03/17 1057      Nutrition Therapy   Diet  TLC    Protein (specify units)  10oz    Fiber  25 grams    Saturated Fats  12 max. grams    Fruits and Vegetables  4 servings/day    Sodium  2000 grams      Personal Nutrition Goals   Nutrition Goal  Try Premier Protein drink as an alternative to the Atkins shakes, consume these on a more regular basis    Personal Goal #2  Add healthy fats and full-fat yogurts to meals and snacks, and add an evening snack regularly to encourage weight maintenance/ gain      Intervention Plan   Intervention  Prescribe, educate and counsel regarding individualized specific dietary modifications aiming towards targeted core components such as weight, hypertension, lipid management, diabetes, heart failure and other comorbidities.    Expected Outcomes  Short Term Goal: Understand basic principles of dietary content, such as calories, fat, sodium, cholesterol and nutrients.;Short Term Goal: A plan has been developed with personal nutrition goals set during dietitian appointment.;Long Term Goal:  Adherence to prescribed nutrition plan.       Nutrition Assessments: Nutrition Assessments - 05/13/17 1213      MEDFICTS Scores   Pre Score  12       Nutrition Goals Re-Evaluation: Nutrition Goals Re-Evaluation    Row Name 06/24/17 1103 07/20/17 1101           Goals   Current Weight  100 lb 6.4 oz (45.5 kg)  102 lb (46.3 kg)      Nutrition Goal  Gain some weight and try to inake more protien.  Maintain weight, continue to intake protein since appetite is low      Comment  Sherri Price does not eat sweets often but will have some once in awhile. She has been trying to drink more protien shakes for weight gain.  Sherri Price likes her current weight, but does report still having a decreased appetite. After her nutrition appointment, she is trying to focus on eating more protein, nutrient food when she is hungry to maximize on healthier intake      Expected Outcome  Short: eat foods higher in protien to gain weight. Long: maintain eating a higher protien diet.  Short: eat nutrient foods  with protein to maintain weight. Long: maintain dietary goals set by nutritionist and staff after graduation.          Nutrition Goals Discharge (Final Nutrition Goals Re-Evaluation): Nutrition Goals Re-Evaluation - 07/20/17 1101      Goals   Current Weight  102 lb (46.3 kg)    Nutrition Goal  Maintain weight, continue to intake protein since appetite is low    Comment  Sherri Price likes her current weight, but does report still having a decreased appetite. After her nutrition appointment, she is trying to focus on eating more protein, nutrient food when she is hungry to maximize on healthier intake    Expected Outcome  Short: eat nutrient foods with protein to maintain weight. Long: maintain dietary goals set by nutritionist and staff after graduation.        Psychosocial: Target Goals: Acknowledge presence or absence of significant depression and/or stress, maximize coping skills, provide positive support system.  Participant is able to verbalize types and ability to use techniques and skills needed for reducing stress and depression.   Initial Review & Psychosocial Screening: Initial Psych Review & Screening - 05/05/17 1141      Initial Review   Current issues with  Current Sleep Concerns    Source of Stress Concerns  Chronic Illness      Family Dynamics   Good Support System?  Yes    Comments  Her husband is a good support system. Most of her family is in [place].      Barriers   Psychosocial barriers to participate in program  The patient should benefit from training in stress management and relaxation.      Screening Interventions   Interventions  Yes;Encouraged to exercise;Program counselor consult;Provide feedback about the scores to participant;To provide support and resources with identified psychosocial needs    Expected Outcomes  Short Term goal: Identification and review with participant of any Quality of Life or Depression concerns found by scoring the questionnaire.;Long Term goal: The participant improves quality of Life and PHQ9 Scores as seen by post scores and/or verbalization of changes;Long Term Goal: Stressors or current issues are controlled or eliminated.;Short Term goal: Utilizing psychosocial counselor, staff and physician to assist with identification of specific Stressors or current issues interfering with healing process. Setting desired goal for each stressor or current issue identified.       Quality of Life Scores:  Scores of 19 and below usually indicate a poorer quality of life in these areas.  A difference of  2-3 points is a clinically meaningful difference.  A difference of 2-3 points in the total score of the Quality of Life Index has been associated with significant improvement in overall quality of life, self-image, physical symptoms, and general health in studies assessing change in quality of life.  PHQ-9: Recent Review Flowsheet Data    Depression screen  [facility] 2/9 07/22/2017 06/24/2017 05/05/2017 04/30/2017   Decreased Interest _0 0   Down, Depressed, Hopeless 1 1 0 0   PHQ - 2 Score _1 0   Altered sleeping _2 -   Tired, decreased energy _3 -   Change in appetite 1 0 0 -   Feeling bad or failure about yourself  0 0 0 -   Trouble concentrating 0 0 0 -   Moving slowly or fidgety/restless 0 0 0 -   Suicidal thoughts 0 0 0 -   PHQ-9 Score _4 -  Difficult doing work/chores - Somewhat difficult Very difficult -     Interpretation of Total Score  Total Score Depression Severity:  1-4 = Minimal depression, 5-9 = Mild depression, 10-14 = Moderate depression, 15-19 = Moderately severe depression, 20-27 = Severe depression   Psychosocial Evaluation and Intervention: Psychosocial Evaluation - 05/13/17 1108      Psychosocial Evaluation & Interventions   Interventions  Encouraged to exercise with the program and follow exercise prescription;Relaxation education    Comments  Counselor met with Ms. Sherri Price [facility]) for initial psychosocial evaluation.  She is an 84 year old who has a diagnosis of pulmonary fibrosis.  She was just released from the hospital several weeks ago with complications related to this disease.  Manya has a strong support system with a spouse of 85 years; one of his sons and family close by and she is actively involved in her local church.  She also has (3) children who check in often and live in [place].  Yovana reports being healthy for the most part besides the pulmonary diagnosis.  She sleeps "fair" at night and has a good appetite.  She denies a history or current symptoms of depression or anxiety and states she is generally in a positive mood most of the time.  Ameila has goals to breathe better and increase her stamina/strength overall while in this program.  Staff will follow with Zyasia throughout the course of this program.      Expected Outcomes  Yulisa will benefit from consistent exercise to achieve her stated  goals.  The educational and psychoeducational components of this program will be helpful in understanding and coping better with her condition.         Psychosocial Re-Evaluation: Psychosocial Re-Evaluation    [place] Name 06/08/17 1451 06/24/17 1107 07/20/17 1104         Psychosocial Re-Evaluation   Current issues with  Current Stress Concerns  Current Stress Concerns;Current Sleep Concerns  Current Stress Concerns;Current Sleep Concerns     Comments  Sherri Price is doing well in [facility]. Wearing oxygen 24 hours a day is taking its toll on her. I informed her that with her disease oxygen is something she needs to have and that it is a drug that she requires. She informed us that she is taking her medications regularly and has not needed to use her rescue inhaler. Informed her that she is doing all that she can to help with her situation and doing [facility] is an important aspect for her to get better.  Sherri Price takes me[place]in to help her sleep at night. She was recently diagnosed with Lung Cancer. Her doctor told her that they cannot do anything about it. She has a good outlook on her diagnosis. She says she is willing to do whatever it takes to make it through this. Her husband is there for her and makes it alot easier to deal with her condition.  Sherri Price is continuing to take Melatonin and Magnesium every night to help with sleep. She is happy to report that it is helping. Her health is still a concern for her, but she is taking it day by day. She is excited to start the Care class for exercise and support.      Expected Outcomes  Short: attend [facility] to reduce stress. Long: Maintain an exercise program to help with her disease and stress.  Short: talk with her doctor about options. Long: live life one day at a time.  Short: continue to come to  [facility] and graduate in a couple more sessions. Long: come to Care for support with her diagnosis of lung cancer and continue her melatonin to sleep at night.       Interventions  Encouraged to attend Pul[facility] Rehabilitation for the exercise  Encouraged to attend [facility] for the exercise  -     Continue Psychosocial Services   Follow up required by staff  Follow up required by staff  -        Psychosocial Discharge (Final Psychosocial Re-Evaluation): Psychosocial Re-Evaluation - 07/20/17 1104      Psychosocial Re-Evaluation   Current issues with  Current Stress Concerns;Current Sleep Concerns    Comments  Sherri Price is continuing to take Melatonin and Magnesium every night to help with sleep. She is happy to report that it is helping. Her health is still a concern for her, but she is taking it day by day. She is excited to start the Care class for exercise and support.     Expected Outcomes  Short: continue to come to [place] and graduate in a couple more sessions. Long: come to Care for support with her diagnosis of lung cancer and continue her melatonin to sleep at night.        Education: Education Goals: Education classes will be provided on a weekly basis, covering required topics. Participant will state understanding/return demonstration of topics presented.  Learning Barriers/Preferences: Learning Barriers/Preferences - 05/05/17 1144      Learning Barriers/Preferences   Learning Barriers  Sight wears glasses    Learning Preferences  None       Education Topics:  Initial Evaluation Education: - Verbal, written and demonstration of respiratory meds, oximetry and breathing techniques. Instruction on use of nebulizers and MDIs and importance of monitoring MDI activations.   Pulmonary Rehab from 07/22/2017 in [facility] Cardiac and Pulmonary Rehab  Date  05/05/17  Educator  [facility]  Instruction Review Code  1- Verbalizes Understanding      General Nutrition Guidelines/Fats and Fiber: -Group instruction provided by verbal, written material, models and posters to present the general guidelines for heart healthy nutrition. Gives an  explanation and review of dietary fats and fiber.   Pulmonary Rehab from 07/22/2017 in [facility] Cardiac and Pulmonary Rehab  Date  06/15/17  Educator  CR  Instruction Review Code  1- Verbalizes Understanding      Controlling Sodium/Reading Food Labels: -Group verbal and written material supporting the discussion of sodium use in heart healthy nutrition. Review and explanation with models, verbal and written materials for utilization of the food label.   Pulmonary Rehab from 07/22/2017 in [facility] Cardiac and Pulmonary Rehab  Date  06/22/17  Educator  PI  Instruction Review Code  1- Verbalizes Understanding      Exercise Physiology & General Exercise Guidelines: - Group verbal and written instruction with models to review the exercise physiology of the cardiovascular system and associated critical values. Provides general exercise guidelines with specific guidelines to those with heart or lung disease.    Pulmonary Rehab from 07/22/2017 in [facility] Cardiac and Pulmonary Rehab  Date  05/22/17  Educator  [facility]  Instruction Review Code  1- Verbalizes Understanding      Aerobic Exercise & Resistance Training: - Gives group verbal and written instruction on the various components of exercise. Focuses on aerobic and resistive training programs and the benefits of this training and how to safely progress through these programs.   Pulmonary Rehab from 07/22/2017 in [facility] Cardiac and Pulmonary Rehab  Date  06/05/17  Educator  AS  Instruction Review Code  1- Verbalizes Understanding      Flexibility, Balance, Mind/Body Relaxation: Provides group verbal/written instruction on the benefits of flexibility and balance training, including mind/body exercise modes such as yoga, pilates and tai chi.  Demonstration and skill practice provided.   Pulmonary Rehab from 07/22/2017 in [facility] Cardiac and Pulmonary Rehab  Date  07/01/17  Educator  AS  Instruction Review Code  1- Verbalizes Understanding      Stress and  Anxiety: - Provides group verbal and written instruction about the health risks of elevated stress and causes of high stress.  Discuss the correlation between heart/lung disease and anxiety and treatment options. Review healthy ways to manage with stress and anxiety.   Pulmonary Rehab from 07/22/2017 in [facility] Cardiac and Pulmonary Rehab  Date  07/22/17  Educator  [facility]  Instruction Review Code  1- Verbalizes Understanding      Depression: - Provides group verbal and written instruction on the correlation between heart/lung disease and depressed mood, treatment options, and the stigmas associated with seeking treatment.   Pulmonary Rehab from 07/22/2017 in [facility] Cardiac and Pulmonary Rehab  Date  07/08/17  Educator  [facility]  Instruction Review Code  1- Verbalizes Understanding      Exercise & Equipment Safety: - Individual verbal instruction and demonstration of equipment use and safety with use of the equipment.   Pulmonary Rehab from 07/22/2017 in [facility] Cardiac and Pulmonary Rehab  Date  05/05/17  Educator  [facility]  Instruction Review Code  1- Verbalizes Understanding      Infection Prevention: - Provides verbal and written material to individual with discussion of infection control including proper hand washing and proper equipment cleaning during exercise session.   Pulmonary Rehab from 07/22/2017 in [facility] Cardiac and Pulmonary Rehab  Date  05/05/17  Educator  [facility]  Instruction Review Code  1- Verbalizes Understanding      Falls Prevention: - Provides verbal and written material to individual with discussion of falls prevention and safety.   Pulmonary Rehab from 07/22/2017 in [facility] Cardiac and Pulmonary Rehab  Date  05/05/17  Educator  [facility]  Instruction Review Code  1- Verbalizes Understanding      Diabetes: - Individual verbal and written instruction to review signs/symptoms of diabetes, desired ranges of glucose level fasting, after meals and with exercise. Advice that pre and post exercise  glucose checks will be done for 3 sessions at entry of program.   Chronic Lung Diseases: - Group verbal and written instruction to review updates, respiratory medications, advancements in procedures and treatments. Discuss use of supplemental oxygen including available portable oxygen systems, continuous and intermittent flow rates, concentrators, personal use and safety guidelines. Review proper use of inhaler and spacers. Provide informative websites for self-education.    Pulmonary Rehab from 07/22/2017 in [facility] Cardiac and Pulmonary Rehab  Date  05/20/17  Educator  [facility]  Instruction Review Code  1- Verbalizes Understanding      Energy Conservation: - Provide group verbal and written instruction for methods to conserve energy, plan and organize activities. Instruct on pacing techniques, use of adaptive equipment and posture/positioning to relieve shortness of breath.   Pulmonary Rehab from 07/22/2017 in [facility] Cardiac and Pulmonary Rehab  Date  07/15/17  Educator  [facility]  Instruction Review Code  1- Verbalizes Understanding      Triggers and Exacerbations: - Group verbal and written instruction to review types of environmental triggers and ways to prevent exacerbations.  Discuss weather changes, air quality and the benefits of nasal washing. Review warning signs and symptoms to help prevent infections. Discuss techniques for effective airway clearance, coughing, and vibrations.   Pulmonary Rehab from 07/22/2017 in [facility] Cardiac and Pulmonary Rehab  Date  06/03/17  Educator  [facility]  Instruction Review Code  1- Verbalizes Understanding      AED/CPR: - Group verbal and written instruction with the use of models to demonstrate the basic use of the AED with the basic ABC's of resuscitation.   Pulmonary Rehab from 07/22/2017 in [facility] Cardiac and Pulmonary Rehab  Date  07/17/17  Educator  [facility]  Instruction Review Code  1- Actuary and Physiology of the Lungs: - Group verbal and  written instruction with the use of models to provide basic lung anatomy and physiology related to function, structure and complications of lung disease.   Anatomy & Physiology of the Heart: - Group verbal and written instruction and models provide basic cardiac anatomy and physiology, with the coronary electrical and arterial systems. Review of Valvular disease and Heart Failure   Pulmonary Rehab from 07/22/2017 in [facility] Cardiac and Pulmonary Rehab  Date  06/17/17  Educator  [facility]  Instruction Review Code  1- Verbalizes Understanding      Cardiac Medications: - Group verbal and written instruction to review commonly prescribed medications for heart disease. Reviews the medication, class of the drug, and side effects.   Pulmonary Rehab from 07/22/2017 in [facility] Cardiac and Pulmonary Rehab  Date  07/03/17  Educator  [facility]  Instruction Review Code  1- Verbalizes Understanding      Know Your Numbers and Risk Factors: -Group verbal and written instruction about important numbers in your health.  Discussion of what are risk factors and how they play a role in the disease process.  Review of Cholesterol, Blood Pressure, Diabetes, and BMI and the role they play in your overall health.   Pulmonary Rehab from 07/22/2017 in [facility] Cardiac and Pulmonary Rehab  Date  05/27/17  Educator  [facility]  Instruction Review Code  1- Verbalizes Understanding      Sleep Hygiene: -Provides group verbal and written instruction about how sleep can affect your health.  Define sleep hygiene, discuss sleep cycles and impact of sleep habits. Review good sleep hygiene tips.    Other: -Provides group and verbal instruction on various topics (see comments)   Pulmonary Rehab from 07/22/2017 in [facility] Cardiac and Pulmonary Rehab  Date  06/10/17  Educator  [facility]  Instruction Review Code  1- Verbalizes Understanding [SLEEP]       Knowledge Questionnaire Score: Knowledge Questionnaire Score - 07/22/17 1204      Knowledge Questionnaire  Score   Pre Score  16/18    Post Score  15/18 reviewed with patient        Core Components/Risk Factors/Patient Goals at Admission: Personal Goals and Risk Factors at Admission - 05/05/17 1146      Core Components/Risk Factors/Patient Goals on Admission    Weight Management  Weight Maintenance;Yes    Intervention  Weight Management: Develop a combined nutrition and exercise program designed to reach desired caloric intake, while maintaining appropriate intake of nutrient and fiber, sodium and fats, and appropriate energy expenditure required for the weight goal.;Weight Management/Obesity: Establish reasonable short term and long term weight goals.;Weight Management: Provide education and appropriate resources to help participant work on and attain dietary goals.    Admit Weight  100 lb 8 oz (  45.6 kg)    Goal Weight: Short Term  100 lb (45.4 kg)    Goal Weight: Long Term  100 lb (45.4 kg)    Expected Outcomes  Short Term: Continue to assess and modify interventions until short term weight is achieved;Long Term: Adherence to nutrition and physical activity/exercise program aimed toward attainment of established weight goal;Weight Maintenance: Understanding of the daily nutrition guidelines, which includes 25-35% calories from fat, 7% or less cal from saturated fats, less than 258m cholesterol, less than 1.5gm of sodium, & 5 or more servings of fruits and vegetables daily    Improve shortness of breath with ADL's  Yes    Intervention  Provide education, individualized exercise plan and daily activity instruction to help decrease symptoms of SOB with activities of daily living.    Expected Outcomes  Short Term: Achieves a reduction of symptoms when performing activities of daily living.       Core Components/Risk Factors/Patient Goals Review:  Goals and Risk Factor Review    Row Name 06/24/17 1058 07/20/17 1049           Core Components/Risk Factors/Patient Goals Review   Personal Goals  Review  Weight Management/Obesity;Improve shortness of breath with ADL's  Weight Management/Obesity;Improve shortness of breath with ADL's      Review  Sherri Price states that she cannot do housework like she used to. When she cooks and does some dishes she does not get too short of breath. She focuses on not walking too fast so she does not get short of breath.  Sherri Price is maintaining her weight around 102 lb. Her breathing has improved a little bit, especially when using her PLB technique. However, she knows due to her disease that her breathing will not get too much better. She has noticed an improvement in her energy as well, as she focuses on taking things slower and spacing out her housework.       Expected Outcomes  Short: gain some weight, improve her ADLS with exercise. Long: maintain weight gain.  Short: maintain weight and continue to work on SOB with ADLS by continueing to exercise. [place] [facility] to continue to work on shortness of breath.          Core Components/Risk Factors/Patient Goals at Discharge (Final Review):  Goals and Risk Factor Review - 07/20/17 1049      Core Components/Risk Factors/Patient Goals Review   Personal Goals Review  Weight Management/Obesity;Improve shortness of breath with ADL's    Review  Sherri Price is maintaining her weight around 102 lb. Her breathing has improved a little bit, especially when using her PLB technique. However, she knows due to her disease that her breathing will not get too much better. She has noticed an improvement in her energy as well, as she focuses on taking things slower and spacing out her housework.     Expected Outcomes  Short: maintain weight and continue to work on SOB with ADLS by continueing to exercise. [place] [facility] to continue to work on shortness of breath.        ITP Comments: ITP Comments    Row Name 05/05/17 1102 05/15/17 1027 06/01/17 0822 06/22/17 1053  06/29/17 0918   ITP Comments  Medical Evaluation completed. Chart sent for review and changes to Dr. MEmily FilbertDirector of [place] [facility]  Patient needs to use 6 liters when exercising. Her oxygen on 4  liters was below 85% on the treadmill. Will send note to Dr. Lequita Halt for an Oxygen order for 6 liters.  30 day review completed. ITP sent to Dr. Emily Filbert Director of [place]. Continue with ITP unless changes are made by physician.  Sherri Price stated that her Doctors appointment did not go well on Friday and that she has Cancer of the Lung. She states that there is nothing they could do for it.  30 day review completed. ITP sent to Dr. Emily Filbert Director of [place]. Continue with ITP unless changes are made by physician.   [place] Name 07/27/17 0826           ITP Comments  30 day review completed. ITP sent to Dr. Emily Filbert Director of [place]. Continue with ITP unless changes are made by physician.          Comments: 30 day review

## 2017-07-28 ENCOUNTER — Ambulatory Visit: Payer: PRIVATE HEALTH INSURANCE

## 2017-07-29 ENCOUNTER — Encounter: Payer: Medicare Other | Admitting: *Deleted

## 2017-07-29 DIAGNOSIS — J8410 Pulmonary fibrosis, unspecified: Secondary | ICD-10-CM | POA: Diagnosis not present

## 2017-07-29 NOTE — Progress Notes (Signed)
Daily Session Note  Patient Details  Name: Sherri Price MRN: [id number] Date of Birth: 05/02/1934 Referring Provider:     Pulmonary Rehab from 05/05/2017 in [facility] Cardiac and Pulmonary Rehab  Referring Provider  Caryl Comes      Encounter Date: 07/29/2017  Check In: Session Check In - 07/29/17 0959      Check-In   Location  [facility]-Cardiac & Pulmonary Rehab    Staff Present  Alberteen Sam, MA, RCEP, CCRP, Exercise Physiologist;Amanda Oletta Darter, [place], ACSM CEP, Exercise Physiologist;Joseph Flavia Shipper    Supervising physician immediately available to respond to emergencies  [facility] immediately available ER MD    Physician(s)  Drs. Lord and [employer]    Medication changes reported      No    Fall or balance concerns reported     No    Warm-up and Cool-down  Performed as group-led Higher education careers adviser Performed  Yes    VAD Patient?  No      Pain Assessment   Currently in Pain?  No/denies          Social History   Tobacco Use  Smoking Status Never Smoker  Smokeless Tobacco Never Used    Goals Met:  Proper associated with RPD/PD & O2 Sat Independence with exercise equipment Using PLB without cueing & demonstrates good technique Exercise tolerated well No report of cardiac concerns or symptoms Strength training completed today  Goals Unmet:  Not Applicable  Comments: Pt able to follow exercise prescription today without complaint.  Will continue to monitor for progression.    Dr. Emily Filbert is Medical Director for [place] and [facility].

## 2017-07-30 ENCOUNTER — Ambulatory Visit: Payer: Self-pay | Admitting: Internal Medicine

## 2017-07-31 DIAGNOSIS — J8410 Pulmonary fibrosis, unspecified: Secondary | ICD-10-CM

## 2017-07-31 NOTE — Progress Notes (Signed)
Daily Session Note  Patient Details  Name: Sherri Price MRN: [id number] Date of Birth: 18-Sep-1933 Referring Provider:     Pulmonary Rehab from 05/05/2017 in [facility] Cardiac and Pulmonary Rehab  Referring Provider  Caryl Comes      Encounter Date: 07/31/2017  Check In: Session Check In - 07/31/17 1008      Check-In   Location  [facility]-Cardiac & Pulmonary Rehab    Staff Present  Renita Papa, RN BSN;Mandi [place], BS, PEC;Aundre Hietala [place]    Supervising physician immediately available to respond to emergencies  [facility] immediately available ER MD    Physician(s)  Dr. Burlene Arnt and Corky Downs    Medication changes reported      No    Fall or balance concerns reported     No    Tobacco Cessation  No Change    Warm-up and Cool-down  Performed as group-led instruction    Resistance Training Performed  Yes    VAD Patient?  No      Pain Assessment   Currently in Pain?  No/denies          Social History   Tobacco Use  Smoking Status Never Smoker  Smokeless Tobacco Never Used    Goals Met:  Independence with exercise equipment Exercise tolerated well No report of cardiac concerns or symptoms Strength training completed today  Goals Unmet:  Not Applicable  Comments: Pt able to follow exercise prescription today without complaint.  Will continue to monitor for progression.   Dr. Emily Filbert is Medical Director for [place] and [facility].

## 2017-08-03 VITALS — Ht 59.5 in | Wt 102.7 lb

## 2017-08-03 DIAGNOSIS — J8410 Pulmonary fibrosis, unspecified: Secondary | ICD-10-CM | POA: Diagnosis not present

## 2017-08-03 NOTE — Progress Notes (Signed)
Daily Session Note  Patient Details  Name: Sherri Price MRN: [id number] Date of Birth: Apr 28, 1934 Referring Provider:     Pulmonary Rehab from 05/05/2017 in [facility] Cardiac and Pulmonary Rehab  Referring Provider  Caryl Comes      Encounter Date: 08/03/2017  Check In: Session Check In - 08/03/17 0948      Check-In   Location  [facility]-Cardiac & Pulmonary Rehab    Staff Present  Nada Maclachlan, BA, ACSM CEP, Exercise Physiologist;Kelly Amedeo Plenty, BS, ACSM CEP, Exercise Physiologist;Netra Postlethwait Flavia Shipper    Supervising physician immediately available to respond to emergencies  [facility] immediately available ER MD    Physician(s)  Dr. Burlene Arnt and Corky Downs    Medication changes reported      No    Fall or balance concerns reported     No    Tobacco Cessation  No Change    Warm-up and Cool-down  Performed as group-led instruction    Resistance Training Performed  Yes    VAD Patient?  No      Pain Assessment   Currently in Pain?  No/denies          Social History   Tobacco Use  Smoking Status Never Smoker  Smokeless Tobacco Never Used    Goals Met:  Independence with exercise equipment Exercise tolerated well No report of cardiac concerns or symptoms Strength training completed today  Goals Unmet:  Not Applicable  Comments: Pt able to follow exercise prescription today without complaint.  Will continue to monitor for progression. [place] Name 05/05/17 1256 08/03/17 1039       6 Minute Walk   Phase  -  Discharge    Distance  880 feet  770 feet    Distance % Change  -  -12 %    Distance Feet Change  -  -110 ft    Walk Time  6 minutes  6 minutes    # of Rest Breaks  0  0    MPH  1.67  1.46    METS  1.51  1.42    RPE  12  12    Perceived Dyspnea   2  2    VO2 Peak  5.28  4.97    Symptoms  No  No    Resting HR  80 bpm  83 bpm    Resting BP  106/52  108/56    Resting Oxygen Saturation   92 %  96 %    Exercise Oxygen Saturation  during 6 min walk  91 %  88 %     Max Ex. HR  84 bpm  89 bpm    Max Ex. BP  116/54  128/64    2 Minute Post BP  96/54  -      Interval HR   1 Minute HR  81  89    2 Minute HR  84  85    3 Minute HR  83  85    4 Minute HR  82  82    5 Minute HR  84  85    6 Minute HR  82  85    2 Minute Post HR  76  82    Interval Heart Rate?  Yes  Yes      Interval Oxygen   Interval Oxygen?  Yes  Yes    Baseline Oxygen Saturation %  92 %  96 %    1 Minute Oxygen  Saturation %  94 %  94 %    1 Minute Liters of Oxygen  3 L pulsed - own tank  4 L    2 Minute Oxygen Saturation %  94 %  91 %    2 Minute Liters of Oxygen  3 L  4 L    3 Minute Oxygen Saturation %  94 %  91 %    3 Minute Liters of Oxygen  3 L  4 L    4 Minute Oxygen Saturation %  92 %  90 %    4 Minute Liters of Oxygen  3 L  4 L    5 Minute Oxygen Saturation %  91 %  88 %    5 Minute Liters of Oxygen  3 L  4 L    6 Minute Oxygen Saturation %  93 %  88 %    6 Minute Liters of Oxygen  3 L  4 L    2 Minute Post Oxygen Saturation %  96 %  95 %    2 Minute Post Liters of Oxygen  3 L  4 L         Dr. Emily Filbert is Medical Director for [place] and [facility].

## 2017-08-05 DIAGNOSIS — J8410 Pulmonary fibrosis, unspecified: Secondary | ICD-10-CM | POA: Diagnosis not present

## 2017-08-05 NOTE — Patient Instructions (Signed)
Discharge Patient Instructions  Patient Details  Name: Sherri Price MRN: [id number] Date of Birth: 14-Jul-1933 Referring Provider:  Adin Hector, MD   Number of Visits: 36  Reason for Discharge:  Patient reached a stable level of exercise. Patient independent in their exercise. Patient has met program and personal goals.  Smoking History:  Social History   Tobacco Use  Smoking Status Never Smoker  Smokeless Tobacco Never Used    Diagnosis:  Pulmonary fibrosis ([place])  Initial Exercise Prescription: Initial Exercise Prescription - 05/05/17 1200      Date of Initial Exercise RX and Referring Provider   Date  05/05/17    Referring Provider  Caryl Comes      Oxygen   Oxygen  Intermittent at home - will use continuous exercise    Liters  3      Treadmill   MPH  1.5    Grade  0    Minutes  15    METs  2.15      Recumbant Bike   Level  1    RPM  60    Watts  7    Minutes  15    METs  2.15      NuStep   Level  3    SPM  80    Minutes  15    METs  2.15      Prescription Details   Frequency (times per week)  3    Duration  Progress to 45 minutes of aerobic exercise without signs/symptoms of physical distress      Intensity   THRR 40-80% of Max Heartrate  104-127    Ratings of Perceived Exertion  11-13    Perceived Dyspnea  0-4      Resistance Training   Training Prescription  Yes    Weight  2 lb    Reps  10-15       Discharge Exercise Prescription (Final Exercise Prescription Changes): Exercise Prescription Changes - 07/29/17 1100      Response to Exercise   Blood Pressure (Admit)  108/58    Blood Pressure (Exit)  110/64    Heart Rate (Admit)  75 bpm    Heart Rate (Exercise)  75 bpm    Heart Rate (Exit)  69 bpm    Oxygen Saturation (Admit)  98 %    Oxygen Saturation (Exercise)  89 %    Oxygen Saturation (Exit)  97 %    Rating of Perceived Exertion (Exercise)  13    Perceived Dyspnea (Exercise)  2    Symptoms  none    Duration  Continue with 45  min of aerobic exercise without signs/symptoms of physical distress.    Intensity  Other (comment) RPE at 13 - HR suppressed by meds      Progression   Progression  Continue to progress workloads to maintain intensity without signs/symptoms of physical distress.    Average METs  2      Resistance Training   Training Prescription  Yes    Weight  2 lb    Reps  10-15      Interval Training   Interval Training  No      Oxygen   Oxygen  Continuous    Liters  4      Treadmill   MPH  1.5    Grade  0.5    Minutes  15    METs  2.25      NuStep  Level  4    SPM  72    Minutes  15    METs  1.6      Home Exercise Plan   Plans to continue exercise at  [employer] (comment) [employer] or [employer]    Frequency  Add 1 additional day to program exercise sessions.    Initial Home Exercises Provided  05/25/17       Functional Capacity: 6 Minute Walk    [place] Name 05/05/17 1256 08/03/17 1039 08/05/17 1045     6 Minute Walk   Phase  -  Discharge  Discharge   Distance  880 feet  770 feet  912 feet   Distance % Change  -  -12 %  3 %   Distance Feet Change  -  -110 ft  32 ft   Walk Time  6 minutes  6 minutes  6 minutes   # of Rest Breaks  0  0  0   MPH  1.67  1.46  1.72   METS  1.51  1.42  1.65   RPE  12  12  13    Perceived Dyspnea   2  2  2    VO2 Peak  5.28  4.97  5.77   Symptoms  No  No  No   Resting HR  80 bpm  83 bpm  -   Resting BP  106/52  108/56  -   Resting Oxygen Saturation   92 %  96 %  -   Exercise Oxygen Saturation  during 6 min walk  91 %  88 %  -   Max Ex. HR  84 bpm  89 bpm  -   Max Ex. BP  116/54  128/64  -   2 Minute Post BP  96/54  -  -     Interval HR   1 Minute HR  81  89  -   2 Minute HR  84  85  -   3 Minute HR  83  85  -   4 Minute HR  82  82  -   5 Minute HR  84  85  -   6 Minute HR  82  85  -   2 Minute Post HR  76  82  -   Interval Heart Rate?  Yes  Yes  -     Interval Oxygen   Interval Oxygen?  Yes  Yes  -   Baseline Oxygen Saturation %   92 %  96 %  -   1 Minute Oxygen Saturation %  94 %  94 %  -   1 Minute Liters of Oxygen  3 L pulsed - own tank  4 L  -   2 Minute Oxygen Saturation %  94 %  91 %  -   2 Minute Liters of Oxygen  3 L  4 L  -   3 Minute Oxygen Saturation %  94 %  91 %  -   3 Minute Liters of Oxygen  3 L  4 L  -   4 Minute Oxygen Saturation %  92 %  90 %  -   4 Minute Liters of Oxygen  3 L  4 L  -   5 Minute Oxygen Saturation %  91 %  88 %  -   5 Minute Liters of Oxygen  3 L  4 L  -   6 Minute Oxygen Saturation %  93 %  88 %  -   6 Minute Liters of Oxygen  3 L  4 L  -   2 Minute Post Oxygen Saturation %  96 %  95 %  -   2 Minute Post Liters of Oxygen  3 L  4 L  -      Quality of Life:   Personal Goals: Goals established at orientation with interventions provided to work toward goal. Personal Goals and Risk Factors at Admission - 05/05/17 1146      Core Components/Risk Factors/Patient Goals on Admission    Weight Management  Weight Maintenance;Yes    Intervention  Weight Management: Develop a combined nutrition and exercise program designed to reach desired caloric intake, while maintaining appropriate intake of nutrient and fiber, sodium and fats, and appropriate energy expenditure required for the weight goal.;Weight Management/Obesity: Establish reasonable short term and long term weight goals.;Weight Management: Provide education and appropriate resources to help participant work on and attain dietary goals.    Admit Weight  100 lb 8 oz (45.6 kg)    Goal Weight: Short Term  100 lb (45.4 kg)    Goal Weight: Long Term  100 lb (45.4 kg)    Expected Outcomes  Short Term: Continue to assess and modify interventions until short term weight is achieved;Long Term: Adherence to nutrition and physical activity/exercise program aimed toward attainment of established weight goal;Weight Maintenance: Understanding of the daily nutrition guidelines, which includes 25-35% calories from fat, 7% or less cal from  saturated fats, less than 223m cholesterol, less than 1.5gm of sodium, & 5 or more servings of fruits and vegetables daily    Improve shortness of breath with ADL's  Yes    Intervention  Provide education, individualized exercise plan and daily activity instruction to help decrease symptoms of SOB with activities of daily living.    Expected Outcomes  Short Term: Achieves a reduction of symptoms when performing activities of daily living.        Personal Goals Discharge: Goals and Risk Factor Review - 07/20/17 1049      Core Components/Risk Factors/Patient Goals Review   Personal Goals Review  Weight Management/Obesity;Improve shortness of breath with ADL's    Review  Meckenzie is maintaining her weight around 102 lb. Her breathing has improved a little bit, especially when using her PLB technique. However, she knows due to her disease that her breathing will not get too much better. She has noticed an improvement in her energy as well, as she focuses on taking things slower and spacing out her housework.     Expected Outcomes  Short: maintain weight and continue to work on SOB with ADLS by continueing to exercise. Long[place] [facility] [place] to continue to work on shortness of breath.        Exercise Goals and Review: Exercise Goals    Row Name 05/05/17 1255             Exercise Goals   Increase Physical Activity  Yes       Intervention  Provide advice, education, support and counseling about physical activity/exercise needs.;Develop an individualized exercise prescription for aerobic and resistive training based on initial evaluation findings, risk stratification, comorbidities and participant's personal goals.       Expected Outcomes  Achievement of increased cardiorespiratory fitness and enhanced flexibility, muscular endurance and strength shown through measurements of functional capacity and personal statement of participant.       Increase Strength  and Stamina   Yes       Intervention  Provide advice, education, support and counseling about physical activity/exercise needs.;Develop an individualized exercise prescription for aerobic and resistive training based on initial evaluation findings, risk stratification, comorbidities and participant's personal goals.       Expected Outcomes  Achievement of increased cardiorespiratory fitness and enhanced flexibility, muscular endurance and strength shown through measurements of functional capacity and personal statement of participant.       Able to understand and use rate of perceived exertion (RPE) scale  Yes       Intervention  Provide education and explanation on how to use RPE scale       Expected Outcomes  Short Term: Able to use RPE daily in rehab to express subjective intensity level;Long Term:  Able to use RPE to guide intensity level when exercising independently       Able to understand and use Dyspnea scale  Yes       Intervention  Provide education and explanation on how to use Dyspnea scale       Expected Outcomes  Short Term: Able to use Dyspnea scale daily in rehab to express subjective sense of shortness of breath during exertion;Long Term: Able to use Dyspnea scale to guide intensity level when exercising independently       Knowledge and understanding of Target Heart Rate Range (THRR)  Yes       Intervention  Provide education and explanation of THRR including how the numbers were predicted and where they are located for reference       Expected Outcomes  Short Term: Able to state/look up THRR;Long Term: Able to use THRR to govern intensity when exercising independently;Short Term: Able to use daily as guideline for intensity in rehab       Able to check pulse independently  Yes       Intervention  Provide education and demonstration on how to check pulse in carotid and radial arteries.;Review the importance of being able to check your own pulse for safety during independent exercise       Expected  Outcomes  Short Term: Able to explain why pulse checking is important during independent exercise;Long Term: Able to check pulse independently and accurately       Understanding of Exercise Prescription  Yes       Intervention  Provide education, explanation, and written materials on patient's individual exercise prescription       Expected Outcomes  Short Term: Able to explain program exercise prescription;Long Term: Able to explain home exercise prescription to exercise independently          Nutrition & Weight - Outcomes: Pre Biometrics - 05/05/17 1255      Pre Biometrics   Height  4' 11.5" (1.511 m)    Weight  100 lb 8 oz (45.6 kg)    Waist Circumference  29 inches    Hip Circumference  35.75 inches    Waist to Hip Ratio  0.81 %    BMI (Calculated)  19.97      Post Biometrics - 08/03/17 1045       Post  Biometrics   Height  4' 11.5" (1.511 m)    Weight  102 lb 11.2 oz (46.6 kg)    Waist Circumference  29 inches    Hip Circumference  35.5 inches    Waist to Hip Ratio  0.82 %    BMI (Calculated)  20.4  Nutrition: Nutrition Therapy & Goals - 06/03/17 1057      Nutrition Therapy   Diet  TLC    Protein (specify units)  10oz    Fiber  25 grams    Saturated Fats  12 max. grams    Fruits and Vegetables  4 servings/day    Sodium  2000 grams      Personal Nutrition Goals   Nutrition Goal  Try Premier Protein drink as an alternative to the Atkins shakes, consume these on a more regular basis    Personal Goal #2  Add healthy fats and full-fat yogurts to meals and snacks, and add an evening snack regularly to encourage weight maintenance/ gain      Intervention Plan   Intervention  Prescribe, educate and counsel regarding individualized specific dietary modifications aiming towards targeted core components such as weight, hypertension, lipid management, diabetes, heart failure and other comorbidities.    Expected Outcomes  Short Term Goal: Understand basic principles of  dietary content, such as calories, fat, sodium, cholesterol and nutrients.;Short Term Goal: A plan has been developed with personal nutrition goals set during dietitian appointment.;Long Term Goal: Adherence to prescribed nutrition plan.       Nutrition Discharge: Nutrition Assessments - 07/31/17 1020      MEDFICTS Scores   Pre Score  12    Post Score  3    Score Difference  -9       Education Questionnaire Score: Knowledge Questionnaire Score - 07/22/17 1204      Knowledge Questionnaire Score   Pre Score  16/18    Post Score  15/18 reviewed with patient       Goals reviewed with patient; copy given to patient.

## 2017-08-05 NOTE — Progress Notes (Signed)
Discharge Progress Report  Patient Details  Name: Sherri Price MRN: [id number] Date of Birth: 1934-02-19 Referring Provider:     Pulmonary Rehab from 05/05/2017 in [facility]  Referring Provider  Caryl Comes       Number of Visits: 36/36  Reason for Discharge:  Patient reached a stable level of exercise. Patient independent in their exercise. Patient has met program and personal goals.  Smoking History:  Social History   Tobacco Use  Smoking Status Never Smoker  Smokeless Tobacco Never Used    Diagnosis:  Pulmonary fibrosis ([place])  ADL [facility]: Pulmonary Assessment Scores    Row Name 05/05/17 1133 06/24/17 1242 07/22/17 1205     ADL [facility]   ADL Phase  Entry  Mid  Exit   SOB Score total  55  74  65   Rest  0  0  3   Walk  3  2  3    Stairs  4  5  4    Bath  2  3  1    Dress  1  3  2    Shop  2  3  2      CAT Score   CAT Score  18  -  21     mMRC Score   mMRC Score  2  -  -   Row Name 08/03/17 1045         mMRC Score   mMRC Score  2        Initial Exercise Prescription: Initial Exercise Prescription - 05/05/17 1200      Date of Initial Exercise RX and Referring Provider   Date  05/05/17    Referring Provider  Caryl Comes      Oxygen   Oxygen  Intermittent at home - will use continuous exercise    Liters  3      Treadmill   MPH  1.5    Grade  0    Minutes  15    METs  2.15      Recumbant Bike   Level  1    RPM  60    Watts  7    Minutes  15    METs  2.15      NuStep   Level  3    SPM  80    Minutes  15    METs  2.15      Prescription Details   Frequency (times per week)  3    Duration  Progress to 45 minutes of aerobic exercise without signs/symptoms of physical distress      Intensity   THRR 40-80% of Max Heartrate  104-127    Ratings of Perceived Exertion  11-13    Perceived Dyspnea  0-4      Resistance Training   Training Prescription  Yes    Weight  2 lb    Reps  10-15       Discharge Exercise Prescription  (Final Exercise Prescription Changes): Exercise Prescription Changes - 07/29/17 1100      Response to Exercise   Blood Pressure (Admit)  108/58    Blood Pressure (Exit)  110/64    Heart Rate (Admit)  75 bpm    Heart Rate (Exercise)  75 bpm    Heart Rate (Exit)  69 bpm    Oxygen Saturation (Admit)  98 %    Oxygen Saturation (Exercise)  89 %    Oxygen Saturation (Exit)  97 %  Rating of Perceived Exertion (Exercise)  13    Perceived Dyspnea (Exercise)  2    Symptoms  none    Duration  Continue with 45 min of aerobic exercise without signs/symptoms of physical distress.    Intensity  Other (comment) RPE at 13 - HR suppressed by meds      Progression   Progression  Continue to progress workloads to maintain intensity without signs/symptoms of physical distress.    Average METs  2      Resistance Training   Training Prescription  Yes    Weight  2 lb    Reps  10-15      Interval Training   Interval Training  No      Oxygen   Oxygen  Continuous    Liters  4      Treadmill   MPH  1.5    Grade  0.5    Minutes  15    METs  2.25      NuStep   Level  4    SPM  72    Minutes  15    METs  1.6      Home Exercise Plan   Plans to continue exercise at  [employer] (comment) [employer] or [employer]    Frequency  Add 1 additional day to program exercise sessions.    Initial Home Exercises Provided  05/25/17       Functional Capacity: 6 Minute Walk    Row Name 05/05/17 1256 08/03/17 1039 08/05/17 1045     6 Minute Walk   Phase  -  Discharge  Discharge   Distance  880 feet  770 feet  912 feet   Distance % Change  -  -12 %  3 %   Distance Feet Change  -  -110 ft  32 ft   Walk Time  6 minutes  6 minutes  6 minutes   # of Rest Breaks  0  0  0   MPH  1.67  1.46  1.72   METS  1.51  1.42  1.65   RPE  12  12  13    Perceived Dyspnea   2  2  2    VO2 Peak  5.28  4.97  5.77   Symptoms  No  No  No   Resting HR  80 bpm  83 bpm  -   Resting BP  106/52  108/56  -   Resting  Oxygen Saturation   92 %  96 %  -   Exercise Oxygen Saturation  during 6 min walk  91 %  88 %  -   Max Ex. HR  84 bpm  89 bpm  -   Max Ex. BP  116/54  128/64  -   2 Minute Post BP  96/54  -  -     Interval HR   1 Minute HR  81  89  -   2 Minute HR  84  85  -   3 Minute HR  83  85  -   4 Minute HR  82  82  -   5 Minute HR  84  85  -   6 Minute HR  82  85  -   2 Minute Post HR  76  82  -   Interval Heart Rate?  Yes  Yes  -     Interval Oxygen   Interval Oxygen?  Yes  Yes  -  Baseline Oxygen Saturation %  92 %  96 %  -   1 Minute Oxygen Saturation %  94 %  94 %  -   1 Minute Liters of Oxygen  3 L pulsed - own tank  4 L  -   2 Minute Oxygen Saturation %  94 %  91 %  -   2 Minute Liters of Oxygen  3 L  4 L  -   3 Minute Oxygen Saturation %  94 %  91 %  -   3 Minute Liters of Oxygen  3 L  4 L  -   4 Minute Oxygen Saturation %  92 %  90 %  -   4 Minute Liters of Oxygen  3 L  4 L  -   5 Minute Oxygen Saturation %  91 %  88 %  -   5 Minute Liters of Oxygen  3 L  4 L  -   6 Minute Oxygen Saturation %  93 %  88 %  -   6 Minute Liters of Oxygen  3 L  4 L  -   2 Minute Post Oxygen Saturation %  96 %  95 %  -   2 Minute Post Liters of Oxygen  3 L  4 L  -      Psychological, QOL, Others - Outcomes: PHQ 2/9: Depression screen [facility] 2/9 07/22/2017 06/24/2017 05/05/2017 04/30/2017  Decreased Interest 1 1 3  0  Down, Depressed, Hopeless 1 1 0 0  PHQ - 2 Score 2 2 3  0  Altered sleeping 3 1 3  -  Tired, decreased energy 2 2 3  -  Change in appetite 1 0 0 -  Feeling bad or failure about yourself  0 0 0 -  Trouble concentrating 0 0 0 -  Moving slowly or fidgety/restless 0 0 0 -  Suicidal thoughts 0 0 0 -  PHQ-9 Score 8 5 9  -  Difficult doing work/chores - Somewhat difficult Very difficult -    Quality of Life:   Personal Goals: Goals established at orientation with interventions provided to work toward goal. Personal Goals and Risk Factors at Admission - 05/05/17 1146      Core  Components/Risk Factors/Patient Goals on Admission    Weight Management  Weight Maintenance;Yes    Intervention  Weight Management: Develop a combined nutrition and exercise program designed to reach desired caloric intake, while maintaining appropriate intake of nutrient and fiber, sodium and fats, and appropriate energy expenditure required for the weight goal.;Weight Management/Obesity: Establish reasonable short term and long term weight goals.;Weight Management: Provide education and appropriate resources to help participant work on and attain dietary goals.    Admit Weight  100 lb 8 oz (45.6 kg)    Goal Weight: Short Term  100 lb (45.4 kg)    Goal Weight: Long Term  100 lb (45.4 kg)    Expected Outcomes  Short Term: Continue to assess and modify interventions until short term weight is achieved;Long Term: Adherence to nutrition and physical activity/exercise program aimed toward attainment of established weight goal;Weight Maintenance: Understanding of the daily nutrition guidelines, which includes 25-35% calories from fat, 7% or less cal from saturated fats, less than 25m cholesterol, less than 1.5gm of sodium, & 5 or more servings of fruits and vegetables daily    Improve shortness of breath with ADL's  Yes    Intervention  Provide education, individualized exercise plan and daily activity instruction to help  decrease symptoms of SOB with activities of daily living.    Expected Outcomes  Short Term: Achieves a reduction of symptoms when performing activities of daily living.        Personal Goals Discharge: Goals and Risk Factor Review    Row Name 06/24/17 1058 07/20/17 1049           Core Components/Risk Factors/Patient Goals Review   Personal Goals Review  Weight Management/Obesity;Improve shortness of breath with ADL's  Weight Management/Obesity;Improve shortness of breath with ADL's      Review  Sherri Price states that she cannot do housework like she used to. When she cooks and does  some dishes she does not get too short of breath. She focuses on not walking too fast so she does not get short of breath.  Sherri Price is maintaining her weight around 102 lb. Her breathing has improved a little bit, especially when using her PLB technique. However, she knows due to her disease that her breathing will not get too much better. She has noticed an improvement in her energy as well, as she focuses on taking things slower and spacing out her housework.       Expected Outcomes  Short: gain some weight, improve her ADLS with exercise. Long: maintain weight gain.  Short: maintain weight and continue to work on SOB with ADLS by continueing to exercise. Long: graduate from [place] and enroll in Care to continue to work on shortness of breath.          Exercise Goals and Review: Exercise Goals    Row Name 05/05/17 1255             Exercise Goals   Increase Physical Activity  Yes       Intervention  Provide advice, education, support and counseling about physical activity/exercise needs.;Develop an individualized exercise prescription for aerobic and resistive training based on initial evaluation findings, risk stratification, comorbidities and participant's personal goals.       Expected Outcomes  Achievement of increased cardiorespiratory fitness and enhanced flexibility, muscular endurance and strength shown through measurements of functional capacity and personal statement of participant.       Increase Strength and Stamina  Yes       Intervention  Provide advice, education, support and counseling about physical activity/exercise needs.;Develop an individualized exercise prescription for aerobic and resistive training based on initial evaluation findings, risk stratification, comorbidities and participant's personal goals.       Expected Outcomes  Achievement of increased cardiorespiratory fitness and enhanced flexibility, muscular endurance and strength shown through measurements of  functional capacity and personal statement of participant.       Able to understand and use rate of perceived exertion (RPE) scale  Yes       Intervention  Provide education and explanation on how to use RPE scale       Expected Outcomes  Short Term: Able to use RPE daily in rehab to express subjective intensity level;Long Term:  Able to use RPE to guide intensity level when exercising independently       Able to understand and use Dyspnea scale  Yes       Intervention  Provide education and explanation on how to use Dyspnea scale       Expected Outcomes  Short Term: Able to use Dyspnea scale daily in rehab to express subjective sense of shortness of breath during exertion;Long Term: Able to use Dyspnea scale to guide intensity level when exercising independently  Knowledge and understanding of Target Heart Rate Range (THRR)  Yes       Intervention  Provide education and explanation of THRR including how the numbers were predicted and where they are located for reference       Expected Outcomes  Short Term: Able to state/look up THRR;Long Term: Able to use THRR to govern intensity when exercising independently;Short Term: Able to use daily as guideline for intensity in rehab       Able to check pulse independently  Yes       Intervention  Provide education and demonstration on how to check pulse in carotid and radial arteries.;Review the importance of being able to check your own pulse for safety during independent exercise       Expected Outcomes  Short Term: Able to explain why pulse checking is important during independent exercise;Long Term: Able to check pulse independently and accurately       Understanding of Exercise Prescription  Yes       Intervention  Provide education, explanation, and written materials on patient's individual exercise prescription       Expected Outcomes  Short Term: Able to explain program exercise prescription;Long Term: Able to explain home exercise prescription  to exercise independently          Nutrition & Weight - Outcomes: Pre Biometrics - 05/05/17 1255      Pre Biometrics   Height  4' 11.5" (1.511 m)    Weight  100 lb 8 oz (45.6 kg)    Waist Circumference  29 inches    Hip Circumference  35.75 inches    Waist to Hip Ratio  0.81 %    BMI (Calculated)  19.97      Post Biometrics - 08/03/17 1045       Post  Biometrics   Height  4' 11.5" (1.511 m)    Weight  102 lb 11.2 oz (46.6 kg)    Waist Circumference  29 inches    Hip Circumference  35.5 inches    Waist to Hip Ratio  0.82 %    BMI (Calculated)  20.4       Nutrition: Nutrition Therapy & Goals - 06/03/17 1057      Nutrition Therapy   Diet  TLC    Protein (specify units)  10oz    Fiber  25 grams    Saturated Fats  12 max. grams    Fruits and Vegetables  4 servings/day    Sodium  2000 grams      Personal Nutrition Goals   Nutrition Goal  Try Premier Protein drink as an alternative to the Atkins shakes, consume these on a more regular basis    Personal Goal #2  Add healthy fats and full-fat yogurts to meals and snacks, and add an evening snack regularly to encourage weight maintenance/ gain      Intervention Plan   Intervention  Prescribe, educate and counsel regarding individualized specific dietary modifications aiming towards targeted core components such as weight, hypertension, lipid management, diabetes, heart failure and other comorbidities.    Expected Outcomes  Short Term Goal: Understand basic principles of dietary content, such as calories, fat, sodium, cholesterol and nutrients.;Short Term Goal: A plan has been developed with personal nutrition goals set during dietitian appointment.;Long Term Goal: Adherence to prescribed nutrition plan.       Nutrition Discharge: Nutrition Assessments - 07/31/17 1020      MEDFICTS Scores   Pre Score  12  Post Score  3    Score Difference  -9       Education Questionnaire Score: Knowledge Questionnaire Score -  07/22/17 1204      Knowledge Questionnaire Score   Pre Score  16/18    Post Score  15/18 reviewed with patient       Goals reviewed with patient; copy given to patient.

## 2017-08-05 NOTE — Progress Notes (Signed)
Pulmonary Individual Treatment Plan  Patient Details  Name: Sherri Price MRN: [id number] Date of Birth: 1934/01/03 Referring Provider:     Pulmonary Rehab from 05/05/2017 in [facility] Re[facility]  Referring Provider  Caryl Comes      Initial Encounter Date:    Pulmonary Rehab from 05/05/2017 in [facility] Rehab  Date  05/05/17  Referring Provider  Caryl Comes      Visit Diagnosis: Pulmonary fibrosis ([place])  Patient's Home Medications on Admission:  Current Outpatient Medications:  .  Ascorbic Acid (VITAMIN C) 1000 MG tablet, Take 1,000 mg by mouth 2 (two) times daily., Disp: , Rfl:  .  Cholecalciferol (VITAMIN D3) 5000 UNITS CAPS, Take 1 capsule by mouth daily., Disp: , Rfl:  .  Coenzyme Q10 100 MG capsule, Take 100 mg by mouth daily. , Disp: , Rfl:  .  Cranberry Fruit 405 MG CAPS, Take 1 capsule by mouth 2 (two) times daily. , Disp: , Rfl:  .  Evening Primrose Oil 500 MG CAPS, Take 1 capsule by mouth 2 (two) times daily. , Disp: , Rfl:  .  FLUOCINOLONE ACETONIDE SCALP 4.27 % OIL, 1 application by Other route daily as needed (psoriasis on scalp). , Disp: , Rfl:  .  L-Lysine 1000 MG TABS, Take 1 tablet by mouth 2 (two) times daily. , Disp: , Rfl:  .  levothyroxine (LEVOXYL) 100 MCG tablet, Take 100 mcg by mouth daily before breakfast. , Disp: , Rfl:  .  Multiple Vitamin (MULTIVITAMIN WITH MINERALS) TABS tablet, Take 1 tablet by mouth daily., Disp: , Rfl:  .  NON FORMULARY, Take 1 tablet by mouth 2 (two) times daily. calcium hydroxyapatite, Disp: , Rfl:  .  NON FORMULARY, Take 1 capsule by mouth 2 (two) times daily. With lunch and dinner. lactate prophase papain bromein, Disp: , Rfl:  .  NON FORMULARY, Take 1 tablet by mouth 2 (two) times daily. Quercetine, Disp: , Rfl:  .  NON FORMULARY, Take 1 Dose by mouth daily. Mixes these 3 OTC triple fiber, fiber smart, psyllium in a cup of juice every morning, Disp: , Rfl:  .  NON FORMULARY, Take 1 capsule by mouth 2 (two)  times daily. Eye health with lutein, Disp: , Rfl:  .  omeprazole (PRILOSEC) 20 MG capsule, Take 20 mg by mouth daily. , Disp: , Rfl:  .  Probiotic Product (PROBIOTIC DAILY PO), Take 2 tablets by mouth at bedtime., Disp: , Rfl:  .  Red Yeast Rice Extract 600 MG CAPS, Take 1 capsule by mouth 2 (two) times daily. , Disp: , Rfl:  .  rivaroxaban (XARELTO) 20 MG TABS tablet, Take 20 mg by mouth 2 (two) times daily at 10 AM and 5 PM., Disp: , Rfl:  .  TURMERIC CURCUMIN PO, Take 1 capsule by mouth daily. Use. One bid, Disp: , Rfl:  .  vitamin E 400 UNIT capsule, Take 400 Units by mouth 2 (two) times daily. , Disp: , Rfl:   Past Medical History: Past Medical History:  Diagnosis Date  . Arthritis   . Bell's palsy   . Cough    CHRONIC  . GERD (gastroesophageal reflux disease)   . Hypothyroidism   . IBS (irritable bowel syndrome)   . Neuropathy   . Oxygen decrease    USES AT NIGHT  . Pulmonary fibrosis ([place])   . Shortness of breath dyspnea     Tobacco Use: Social History   Tobacco Use  Smoking Status Never Smoker  Smokeless Tobacco Never Used    Labs: Recent Review Flowsheet Data    There is no flowsheet data to display.       Pulmonary Assessment Scores: Pulmonary Assessment Scores    Row Name 05/05/17 1133 06/24/17 1242 07/22/17 1205     ADL UCSD   ADL Phase  Entry  Mid  Exit   SOB Score total  55  74  65   Rest  0  0  3   Walk  '3  2  3   '$ Stairs  '4  5  4   '$ Bath  '2  3  1   '$ Dress  '1  3  2   '$ Shop  '2  3  2     '$ CAT Score   CAT Score  18  -  21     mMRC Score   mMRC Score  2  -  -   Row Name 08/03/17 1045         mMRC Score   mMRC Score  2        Pulmonary Function Assessment: Pulmonary Function Assessment - 05/05/17 1222      Breath   Bilateral Breath Sounds  Clear    Shortness of Breath  Fear of Shortness of Breath;Panic with Shortness of Breath;Limiting activity;Yes       Exercise Target Goals:    Exercise Program Goal: Individual exercise  prescription set using results from initial 6 min walk test and THRR while considering  patient's activity barriers and safety.    Exercise Prescription Goal: Initial exercise prescription builds to 30-45 minutes a day of aerobic activity, 2-3 days per week.  Home exercise guidelines will be given to patient during program as part of exercise prescription that the participant will acknowledge.  Activity Barriers & Risk Stratification:   6 Minute Walk: 6 Minute Walk    Row Name 05/05/17 1256 08/03/17 1039 08/05/17 1045     6 Minute Walk   Phase  -  Discharge  Discharge   Distance  880 feet  770 feet  912 feet   Distance % Change  -  -12 %  3 %   Distance Feet Change  -  -110 ft  32 ft   Walk Time  6 minutes  6 minutes  6 minutes   # of Rest Breaks  0  0  0   MPH  1.67  1.46  1.72   METS  1.51  1.42  1.65   RPE  '12  12  13   '$ Perceived Dyspnea   '2  2  2   '$ VO2 Peak  5.28  4.97  5.77   Symptoms  No  No  No   Resting HR  80 bpm  83 bpm  -   Resting BP  106/52  108/56  -   Resting Oxygen Saturation   92 %  96 %  -   Exercise Oxygen Saturation  during 6 min walk  91 %  88 %  -   Max Ex. HR  84 bpm  89 bpm  -   Max Ex. BP  116/54  128/64  -   2 Minute Post BP  96/54  -  -     Interval HR   1 Minute HR  81  89  -   2 Minute HR  84  85  -   3 Minute HR  83  85  -   4  Minute HR  82  82  -   5 Minute HR  84  85  -   6 Minute HR  82  85  -   2 Minute Post HR  76  82  -   Interval Heart Rate?  Yes  Yes  -     Interval Oxygen   Interval Oxygen?  Yes  Yes  -   Baseline Oxygen Saturation %  92 %  96 %  -   1 Minute Oxygen Saturation %  94 %  94 %  -   1 Minute Liters of Oxygen  3 L pulsed - own tank  4 L  -   2 Minute Oxygen Saturation %  94 %  91 %  -   2 Minute Liters of Oxygen  3 L  4 L  -   3 Minute Oxygen Saturation %  94 %  91 %  -   3 Minute Liters of Oxygen  3 L  4 L  -   4 Minute Oxygen Saturation %  92 %  90 %  -   4 Minute Liters of Oxygen  3 L  4 L  -   5 Minute  Oxygen Saturation %  91 %  88 %  -   5 Minute Liters of Oxygen  3 L  4 L  -   6 Minute Oxygen Saturation %  93 %  88 %  -   6 Minute Liters of Oxygen  3 L  4 L  -   2 Minute Post Oxygen Saturation %  96 %  95 %  -   2 Minute Post Liters of Oxygen  3 L  4 L  -     Oxygen Initial Assessment: Oxygen Initial Assessment - 05/05/17 1144      Home Oxygen   Home Oxygen Device  Home Concentrator;E-Tanks    Sleep Oxygen Prescription  Continuous    Liters per minute  3    Home Exercise Oxygen Prescription  Continuous    Liters per minute  3    Home at Rest Exercise Oxygen Prescription  Continuous    Liters per minute  3    Compliance with Home Oxygen Use  Yes      Initial 6 min Walk   Oxygen Used  Continuous    Liters per minute  3      Program Oxygen Prescription   Program Oxygen Prescription  Continuous    Liters per minute  3      Intervention   Short Term Goals  To learn and exhibit compliance with exercise, home and travel O2 prescription;To learn and understand importance of maintaining oxygen saturations>88%;To learn and understand importance of monitoring SPO2 with pulse oximeter and demonstrate accurate use of the pulse oximeter.;To learn and demonstrate proper pursed lip breathing techniques or other breathing techniques.;To learn and demonstrate proper use of respiratory medications    Long  Term Goals  Exhibits compliance with exercise, home and travel O2 prescription;Verbalizes importance of monitoring SPO2 with pulse oximeter and return demonstration;Maintenance of O2 saturations>88%;Exhibits proper breathing techniques, such as pursed lip breathing or other method taught during program session;Compliance with respiratory medication;Demonstrates proper use of MDI's       Oxygen Re-Evaluation: Oxygen Re-Evaluation    Row Name 05/13/17 1105 06/08/17 1457 07/20/17 1118         Program Oxygen Prescription   Program Oxygen Prescription  Continuous  Continuous  Continuous      Liters per minute  '4  4  4     '$ Comments  Increased to 4L as she was at 82% on treadmill on 3L  -  -       Home Oxygen   Home Oxygen Device  Home Concentrator;E-Tanks  Home Concentrator;E-Tanks  Home Concentrator;E-Tanks     Sleep Oxygen Prescription  Continuous  Continuous  Continuous     Liters per minute  '3  3  3     '$ Home Exercise Oxygen Prescription  Continuous  Continuous  Continuous     Liters per minute  '3  3  3     '$ Home at Rest Exercise Oxygen Prescription  Continuous  -  Continuous     Liters per minute  '3  3  3     '$ Compliance with Home Oxygen Use  Yes  Yes  Yes       Goals/Expected Outcomes   Short Term Goals  To learn and understand importance of monitoring SPO2 with pulse oximeter and demonstrate accurate use of the pulse oximeter.;To learn and demonstrate proper pursed lip breathing techniques or other breathing techniques.;To learn and understand importance of maintaining oxygen saturations>88%  To learn and understand importance of monitoring SPO2 with pulse oximeter and demonstrate accurate use of the pulse oximeter.;To learn and demonstrate proper pursed lip breathing techniques or other breathing techniques.;To learn and understand importance of maintaining oxygen saturations>88%;To learn and demonstrate proper use of respiratory medications  To learn and understand importance of monitoring SPO2 with pulse oximeter and demonstrate accurate use of the pulse oximeter.;To learn and demonstrate proper pursed lip breathing techniques or other breathing techniques.;To learn and understand importance of maintaining oxygen saturations>88%;To learn and demonstrate proper use of respiratory medications     Long  Term Goals  Verbalizes importance of monitoring SPO2 with pulse oximeter and return demonstration;Maintenance of O2 saturations>88%;Exhibits proper breathing techniques, such as pursed lip breathing or other method taught during program session  Verbalizes importance of monitoring  SPO2 with pulse oximeter and return demonstration;Maintenance of O2 saturations>88%;Exhibits proper breathing techniques, such as pursed lip breathing or other method taught during program session;Compliance with respiratory medication  Exhibits compliance with exercise, home and travel O2 prescription;Verbalizes importance of monitoring SPO2 with pulse oximeter and return demonstration;Maintenance of O2 saturations>88%;Exhibits proper breathing techniques, such as pursed lip breathing or other method taught during program session;Compliance with respiratory medication     Comments  Reviewed PLB technique with pt.  Talked about how it work and it's important to maintaining his exercise saturations.    Sherri Price is taking her Advair everyday as prescribed and rinsing her mouth afterwards. She states she has not needed to use her albuterol rescue inhaler. She had some questions on when to take the medication. Informed her that if she cant catch her breath or is feeling tight that she should use it. She does not carry her rescue inhaler, informed her that she should keep it with her at all times.  Sherri Price's doctor took her off of Advair a couple weeks ago because he doesn't think it is helping. She is going to ask him to go back on it to see if it will help since she has taken a break and has noticed minor changes.      Goals/Expected Outcomes  Short: Become more profiecient at using PLB.   Long: Become independent at using PLB.  Short: carry her rescue inhaler with her. Long: take her rescue  inhaler independently.  Short: have a conversation with her doctor about restarting her Advair. Long: continue to practice PLB, wear her oxygen accordingly. and stay on top of her appointments        Oxygen Discharge (Final Oxygen Re-Evaluation): Oxygen Re-Evaluation - 07/20/17 1118      Program Oxygen Prescription   Program Oxygen Prescription  Continuous    Liters per minute  4      Home Oxygen   Home Oxygen Device   Home Concentrator;E-Tanks    Sleep Oxygen Prescription  Continuous    Liters per minute  3    Home Exercise Oxygen Prescription  Continuous    Liters per minute  3    Home at Rest Exercise Oxygen Prescription  Continuous    Liters per minute  3    Compliance with Home Oxygen Use  Yes      Goals/Expected Outcomes   Short Term Goals  To learn and understand importance of monitoring SPO2 with pulse oximeter and demonstrate accurate use of the pulse oximeter.;To learn and demonstrate proper pursed lip breathing techniques or other breathing techniques.;To learn and understand importance of maintaining oxygen saturations>88%;To learn and demonstrate proper use of respiratory medications    Long  Term Goals  Exhibits compliance with exercise, home and travel O2 prescription;Verbalizes importance of monitoring SPO2 with pulse oximeter and return demonstration;Maintenance of O2 saturations>88%;Exhibits proper breathing techniques, such as pursed lip breathing or other method taught during program session;Compliance with respiratory medication    Comments  Sherri Price's doctor took her off of Advair a couple weeks ago because he doesn't think it is helping. She is going to ask him to go back on it to see if it will help since she has taken a break and has noticed minor changes.     Goals/Expected Outcomes  Short: have a conversation with her doctor about restarting her Advair. Long: continue to practice PLB, wear her oxygen accordingly. and stay on top of her appointments       Initial Exercise Prescription: Initial Exercise Prescription - 05/05/17 1200      Date of Initial Exercise RX and Referring Provider   Date  05/05/17    Referring Provider  Caryl Comes      Oxygen   Oxygen  Intermittent at home - will use continuous exercise    Liters  3      Treadmill   MPH  1.5    Grade  0    Minutes  15    METs  2.15      Recumbant Bike   Level  1    RPM  60    Watts  7    Minutes  15    METs  2.15       NuStep   Level  3    SPM  80    Minutes  15    METs  2.15      Prescription Details   Frequency (times per week)  3    Duration  Progress to 45 minutes of aerobic exercise without signs/symptoms of physical distress      Intensity   THRR 40-80% of Max Heartrate  104-127    Ratings of Perceived Exertion  11-13    Perceived Dyspnea  0-4      Resistance Training   Training Prescription  Yes    Weight  2 lb    Reps  10-15       Perform Capillary Blood Glucose checks  as needed.  Exercise Prescription Changes: Exercise Prescription Changes    Row Name 05/25/17 1100 06/03/17 1400 06/17/17 1500 07/01/17 1300 07/17/17 0700     Response to Exercise   Blood Pressure (Admit)  -  120/60  108/54  106/72  122/58   Blood Pressure (Exit)  -  134/70  126/64  122/74  110/62   Heart Rate (Admit)  -  79 bpm  85 bpm  -  80 bpm   Heart Rate (Exercise)  -  82 bpm  80 bpm  84 bpm  85 bpm   Heart Rate (Exit)  -  65 bpm  79 bpm  71 bpm  68 bpm   Oxygen Saturation (Admit)  -  96 %  94 %  -  91 %   Oxygen Saturation (Exercise)  -  90 %  91 %  88 %  88 %   Oxygen Saturation (Exit)  -  99 %  99 %  99 %  96 %   Rating of Perceived Exertion (Exercise)  -  '15  13  15  13   '$ Perceived Dyspnea (Exercise)  -  '3  3  3  3   '$ Symptoms  -  none  none  none  none   Duration  -  Continue with 45 min of aerobic exercise without signs/symptoms of physical distress.  Progress to 45 minutes of aerobic exercise without signs/symptoms of physical distress  Continue with 45 min of aerobic exercise without signs/symptoms of physical distress.  Continue with 45 min of aerobic exercise without signs/symptoms of physical distress.   Intensity  -  THRR unchanged  THRR unchanged  THRR unchanged  THRR unchanged     Progression   Progression  -  Continue to progress workloads to maintain intensity without signs/symptoms of physical distress.  Continue to progress workloads to maintain intensity without signs/symptoms of physical  distress.  Continue to progress workloads to maintain intensity without signs/symptoms of physical distress.  Continue to progress workloads to maintain intensity without signs/symptoms of physical distress.   Average METs  -  2.1  2.2  1.9  2.2     Resistance Training   Training Prescription  -  Yes  Yes  Yes  Yes   Weight  -  2 lb  2 lb  2 lb  2 lb   Reps  -  10-15  10-15  10-15  10-15     Interval Training   Interval Training  -  -  -  No  No     Oxygen   Oxygen  -  Continuous  Continuous  Continuous  Continuous   Liters  -  '3  3  4  2 '$ 2-4 L     Treadmill   MPH  -  1.5  -  1.5  -   Grade  -  0  -  0  -   Minutes  -  15  -  15  -   METs  -  2.15  -  2.15  -     Recumbant Bike   Level  -  -  1  -  2   RPM  -  -  60  -  60   Watts  -  -  12  -  13   Minutes  -  -  15  -  15   METs  -  -  2.8  -  2.8  NuStep   Level  -  '4  4  4  4   '$ SPM  -  78  63  74  64   Minutes  -  '15  15  15  15   '$ METs  -  2.1  1.6  1.7  1.6     Home Exercise Plan   Plans to continue exercise at  [employer] (comment) [employer] or Therapist, occupational (comment) [employer] or Therapist, occupational (comment) [employer] or [employer]  -  -   Frequency  Add 1 additional day to program exercise sessions.  Add 1 additional day to program exercise sessions.  Add 1 additional day to program exercise sessions.  -  -   Initial Home Exercises Provided  05/25/17  05/25/17  05/25/17  -  -   Row Name 07/17/17 0800 07/29/17 1100           Response to Exercise   Blood Pressure (Admit)  -  108/58      Blood Pressure (Exit)  -  110/64      Heart Rate (Admit)  -  75 bpm      Heart Rate (Exercise)  -  75 bpm      Heart Rate (Exit)  -  69 bpm      Oxygen Saturation (Admit)  -  98 %      Oxygen Saturation (Exercise)  -  89 %      Oxygen Saturation (Exit)  -  97 %      Rating of Perceived Exertion (Exercise)  -  13      Perceived Dyspnea (Exercise)  -  2      Symptoms  -  none      Duration  -   Continue with 45 min of aerobic exercise without signs/symptoms of physical distress.      Intensity  -  Other (comment) RPE at 13 - HR suppressed by meds        Progression   Progression  -  Continue to progress workloads to maintain intensity without signs/symptoms of physical distress.      Average METs  -  2        Resistance Training   Training Prescription  -  Yes      Weight  -  2 lb      Reps  -  10-15        Interval Training   Interval Training  -  No        Oxygen   Oxygen  -  Continuous      Liters  -  4        Treadmill   MPH  -  1.5      Grade  -  0.5      Minutes  -  15      METs  -  2.25        NuStep   Level  -  4      SPM  -  72      Minutes  -  15      METs  -  1.6        Home Exercise Plan   Plans to continue exercise at  [employer] (comment) [employer] or Therapist, occupational (comment) [employer] or [employer]      Frequency  Add 1 additional day to program exercise sessions.  Add 1  additional day to program exercise sessions.      Initial Home Exercises Provided  05/25/17  05/25/17         Exercise Comments: Exercise Comments    Row Name 05/13/17 1104 08/03/17 1047         Exercise Comments  First full day of exercise!  Patient was oriented to gym and equipment including functions, settings, policies, and procedures.  Patient's individual exercise prescription and treatment plan were reviewed.  All starting workloads were established based on the results of the 6 minute walk test done at initial orientation visit.  The plan for exercise progression was also introduced and progression will be customized based on patient's performance and goals.  Post 6 min walk done today. Results reviewed with patient. See 6 min walk data sheet for detailed report.          Exercise Goals and Review: Exercise Goals    Row Name 05/05/17 1255             Exercise Goals   Increase Physical Activity  Yes       Intervention  Provide advice, education,  support and counseling about physical activity/exercise needs.;Develop an individualized exercise prescription for aerobic and resistive training based on initial evaluation findings, risk stratification, comorbidities and participant's personal goals.       Expected Outcomes  Achievement of increased cardiorespiratory fitness and enhanced flexibility, muscular endurance and strength shown through measurements of functional capacity and personal statement of participant.       Increase Strength and Stamina  Yes       Intervention  Provide advice, education, support and counseling about physical activity/exercise needs.;Develop an individualized exercise prescription for aerobic and resistive training based on initial evaluation findings, risk stratification, comorbidities and participant's personal goals.       Expected Outcomes  Achievement of increased cardiorespiratory fitness and enhanced flexibility, muscular endurance and strength shown through measurements of functional capacity and personal statement of participant.       Able to understand and use rate of perceived exertion (RPE) scale  Yes       Intervention  Provide education and explanation on how to use RPE scale       Expected Outcomes  Short Term: Able to use RPE daily in rehab to express subjective intensity level;Long Term:  Able to use RPE to guide intensity level when exercising independently       Able to understand and use Dyspnea scale  Yes       Intervention  Provide education and explanation on how to use Dyspnea scale       Expected Outcomes  Short Term: Able to use Dyspnea scale daily in rehab to express subjective sense of shortness of breath during exertion;Long Term: Able to use Dyspnea scale to guide intensity level when exercising independently       Knowledge and understanding of Target Heart Rate Range (THRR)  Yes       Intervention  Provide education and explanation of THRR including how the numbers were predicted and  where they are located for reference       Expected Outcomes  Short Term: Able to state/look up THRR;Long Term: Able to use THRR to govern intensity when exercising independently;Short Term: Able to use daily as guideline for intensity in rehab       Able to check pulse independently  Yes       Intervention  Provide education and demonstration on how to check pulse  in carotid and radial arteries.;Review the importance of being able to check your own pulse for safety during independent exercise       Expected Outcomes  Short Term: Able to explain why pulse checking is important during independent exercise;Long Term: Able to check pulse independently and accurately       Understanding of Exercise Prescription  Yes       Intervention  Provide education, explanation, and written materials on patient's individual exercise prescription       Expected Outcomes  Short Term: Able to explain program exercise prescription;Long Term: Able to explain home exercise prescription to exercise independently          Exercise Goals Re-Evaluation : Exercise Goals Re-Evaluation    Row Name 05/13/17 1104 05/25/17 1106 06/03/17 1457 06/17/17 1522 07/01/17 1332     Exercise Goal Re-Evaluation   Exercise Goals Review  Knowledge and understanding of Target Heart Rate Range (THRR);Understanding of Exercise Prescription;Able to understand and use rate of perceived exertion (RPE) scale;Able to understand and use Dyspnea scale  Increase Physical Activity;Able to understand and use Dyspnea scale;Increase Strength and Stamina;Knowledge and understanding of Target Heart Rate Range (THRR);Able to understand and use rate of perceived exertion (RPE) scale;Able to check pulse independently  Increase Strength and Stamina;Increase Physical Activity;Able to understand and use rate of perceived exertion (RPE) scale;Able to understand and use Dyspnea scale  Increase Physical Activity;Increase Strength and Stamina;Understanding of Exercise  Prescription  Increase Physical Activity;Increase Strength and Stamina;Able to understand and use Dyspnea scale;Able to understand and use rate of perceived exertion (RPE) scale   Comments  Reviewed RPE scale, THR and program prescription with pt today.  Pt voiced understanding and was given a copy of goals to take home  Sherri Price previously exercised at the [facility] before needing O2during the day.  She plans to exercise at home one day per week starting now.  Staff gave info about [employer].  Sherri Price is tolerating exercise well and has increased intensity on the NS.  Staff will continue to monitor.  Sherri Price continues to progress well with exercise.  Staff has suggested she increase to 3 lb weights and increase RB resistance.  Sherri Price is reaching target RPE but not HR levels.  Staff will encourage small increases  as tolerated.   Expected Outcomes  Short: Use RPE daily to regulate intensity.  Long: Follow program prescription in [facility].  Short - Kanai will add one day of exrecise per week.  Long - Shital will maintain exercise on her own when she completes [place] will attend LW 3 days per week  Long - Nicha will continue exercise on her own  Short - Demisha will continue to attend Long - Daphne will maintain fitness on her own  [place] will attend regularly.  Long - Kawehi will build CV endurance   Row Name 07/17/17 0803 07/29/17 1135           Exercise Goal Re-Evaluation   Exercise Goals Review  Increase Physical Activity;Able to understand and use rate of perceived exertion (RPE) scale;Increase Strength and Stamina;Able to understand and use Dyspnea scale  Increase Physical Activity;Increase Strength and Stamina;Able to understand and use Dyspnea scale;Able to understand and use rate of perceived exertion (RPE) scale      Comments  Sherri Price is maintaining her current MET level of 2.2.  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     Sherri Price tolerates exercise  well and plans to continue with the CARE class when she finishes LW.  She is motivated to keep up her exercise routine.      Expected Outcomes  Short - [place] will finish LW Long - Renai will cntinue exercise in CARE and on her own  [place] will finish LW    Long - Tomika will continue exercise in the CARE class         Discharge Exercise Prescription (Final Exercise Prescription Changes): Exercise Prescription Changes - 07/29/17 1100      Response to Exercise   Blood Pressure (Admit)  108/58    Blood Pressure (Exit)  110/64    Heart Rate (Admit)  75 bpm    Heart Rate (Exercise)  75 bpm    Heart Rate (Exit)  69 bpm    Oxygen Saturation (Admit)  98 %    Oxygen Saturation (Exercise)  89 %    Oxygen Saturation (Exit)  97 %    Rating of Perceived Exertion (Exercise)  13    Perceived Dyspnea (Exercise)  2    Symptoms  none    Duration  Continue with 45 min of aerobic exercise without signs/symptoms of physical distress.    Intensity  Other (comment) RPE at 13 - HR suppressed by meds      Progression   Progression  Continue to progress workloads to maintain intensity without signs/symptoms of physical distress.    Average METs  2      Resistance Training   Training Prescription  Yes    Weight  2 lb    Reps  10-15      Interval Training   Interval Training  No      Oxygen   Oxygen  Continuous    Liters  4      Treadmill   MPH  1.5    Grade  0.5    Minutes  15    METs  2.25      NuStep   Level  4    SPM  72    Minutes  15    METs  1.6      Home Exercise Plan   Plans to continue exercise at  [employer] (comment) [employer] or [employer]    Frequency  Add 1 additional day to program exercise sessions.    Initial Home Exercises Provided  05/25/17       Nutrition:  Target Goals: Understanding of nutrition guidelines, daily intake of sodium '1500mg'$ , cholesterol '200mg'$ , calories 30% from fat and 7% or less from saturated fats, daily to have 5 or more servings  of fruits and vegetables.  Biometrics: Pre Biometrics - 05/05/17 1255      Pre Biometrics   Height  4' 11.5" (1.511 m)    Weight  100 lb 8 oz (45.6 kg)    Waist Circumference  29 inches    Hip Circumference  35.75 inches    Waist to Hip Ratio  0.81 %    BMI (Calculated)  19.97      Post Biometrics - 08/03/17 1045       Post  Biometrics   Height  4' 11.5" (1.511 m)    Weight  102 lb 11.2 oz (46.6 kg)    Waist Circumference  29 inches    Hip Circumference  35.5 inches    Waist to Hip Ratio  0.82 %    BMI (Calculated)  20.4       Nutrition Therapy  Plan and Nutrition Goals: Nutrition Therapy & Goals - 06/03/17 1057      Nutrition Therapy   Diet  TLC    Protein (specify units)  10oz    Fiber  25 grams    Saturated Fats  12 max. grams    Fruits and Vegetables  4 servings/day    Sodium  2000 grams      Personal Nutrition Goals   Nutrition Goal  Try Premier Protein drink as an alternative to the Atkins shakes, consume these on a more regular basis    Personal Goal #2  Add healthy fats and full-fat yogurts to meals and snacks, and add an evening snack regularly to encourage weight maintenance/ gain      Intervention Plan   Intervention  Prescribe, educate and counsel regarding individualized specific dietary modifications aiming towards targeted core components such as weight, hypertension, lipid management, diabetes, heart failure and other comorbidities.    Expected Outcomes  Short Term Goal: Understand basic principles of dietary content, such as calories, fat, sodium, cholesterol and nutrients.;Short Term Goal: A plan has been developed with personal nutrition goals set during dietitian appointment.;Long Term Goal: Adherence to prescribed nutrition plan.       Nutrition Assessments: Nutrition Assessments - 07/31/17 1020      MEDFICTS Scores   Pre Score  12    Post Score  3    Score Difference  -9       Nutrition Goals Re-Evaluation: Nutrition Goals Re-Evaluation     Row Name 06/24/17 1103 07/20/17 1101           Goals   Current Weight  100 lb 6.4 oz (45.5 kg)  102 lb (46.3 kg)      Nutrition Goal  Gain some weight and try to inake more protien.  Maintain weight, continue to intake protein since appetite is low      Comment  Sherri Price does not eat sweets often but will have some once in awhile. She has been trying to drink more protien shakes for weight gain.  Vaani likes her current weight, but does report still having a decreased appetite. After her nutrition appointment, she is trying to focus on eating more protein, nutrient food when she is hungry to maximize on healthier intake      Expected Outcome  Short: eat foods higher in protien to gain weight. Long: maintain eating a higher protien diet.  Short: eat nutrient foods with protein to maintain weight. Long: maintain dietary goals set by nutritionist and staff after graduation.          Nutrition Goals Discharge (Final Nutrition Goals Re-Evaluation): Nutrition Goals Re-Evaluation - 07/20/17 1101      Goals   Current Weight  102 lb (46.3 kg)    Nutrition Goal  Maintain weight, continue to intake protein since appetite is low    Comment  Sherri Price likes her current weight, but does report still having a decreased appetite. After her nutrition appointment, she is trying to focus on eating more protein, nutrient food when she is hungry to maximize on healthier intake    Expected Outcome  Short: eat nutrient foods with protein to maintain weight. Long: maintain dietary goals set by nutritionist and staff after graduation.        Psychosocial: Target Goals: Acknowledge presence or absence of significant depression and/or stress, maximize coping skills, provide positive support system. Participant is able to verbalize types and ability to use techniques and skills needed for reducing stress  and depression.   Initial Review & Psychosocial Screening: Initial Psych Review & Screening - 05/05/17 1141       Initial Review   Current issues with  Current Sleep Concerns    Source of Stress Concerns  Chronic Illness      Family Dynamics   Good Support System?  Yes    Comments  Her husband is a good support system. Most of her family is in [place].      Barriers   Psychosocial barriers to participate in program  The patient should benefit from training in stress management and relaxation.      Screening Interventions   Interventions  Yes;Encouraged to exercise;Program counselor consult;Provide feedback about the scores to participant;To provide support and resources with identified psychosocial needs    Expected Outcomes  Short Term goal: Identification and review with participant of any Quality of Life or Depression concerns found by scoring the questionnaire.;Long Term goal: The participant improves quality of Life and PHQ9 Scores as seen by post scores and/or verbalization of changes;Long Term Goal: Stressors or current issues are controlled or eliminated.;Short Term goal: Utilizing psychosocial counselor, staff and physician to assist with identification of specific Stressors or current issues interfering with healing process. Setting desired goal for each stressor or current issue identified.       Quality of Life Scores:  Scores of 19 and below usually indicate a poorer quality of life in these areas.  A difference of  2-3 points is a clinically meaningful difference.  A difference of 2-3 points in the total score of the Quality of Life Index has been associated with significant improvement in overall quality of life, self-image, physical symptoms, and general health in studies assessing change in quality of life.  PHQ-9: Recent Review Flowsheet Data    Depression screen [facility] 2/9 07/22/2017 06/24/2017 05/05/2017 04/30/2017   Decreased Interest '1 1 3 '$ 0   Down, Depressed, Hopeless 1 1 0 0   PHQ - 2 Score '2 2 3 '$ 0   Altered sleeping '3 1 3 '$ -   Tired, decreased energy '2 2 3 '$ -   Change in appetite  1 0 0 -   Feeling bad or failure about yourself  0 0 0 -   Trouble concentrating 0 0 0 -   Moving slowly or fidgety/restless 0 0 0 -   Suicidal thoughts 0 0 0 -   PHQ-9 Score '8 5 9 '$ -   Difficult doing work/chores - Somewhat difficult Very difficult -     Interpretation of Total Score  Total Score Depression Severity:  1-4 = Minimal depression, 5-9 = Mild depression, 10-14 = Moderate depression, 15-19 = Moderately severe depression, 20-27 = Severe depression   Psychosocial Evaluation and Intervention: Psychosocial Evaluation - 05/13/17 1108      Psychosocial Evaluation & Interventions   Interventions  Encouraged to exercise with the program and follow exercise prescription;Relaxation education    Comments  Counselor met with Ms. Sherri [facility]) for initial psychosocial evaluation.  She is an 84 year old who has a diagnosis of pulmonary fibrosis.  She was just released from the hospital several weeks ago with complications related to this disease.  [place] has a strong support system with a spouse of 78 years; one of his sons and family close by and she is actively involved in her local church.  She also has (3) children who check in often and live in [place].  Senna reports being healthy for the most part besides  the pulmonary diagnosis.  She sleeps "fair" at night and has a good appetite.  She denies a history or current symptoms of depression or anxiety and states she is generally in a positive mood most of the time.  Ch[place]is has goals to breathe better and increase her stamina/strength overall while in this program.  Staff will follow with Sherri Price throughout the course of this program.      Expected Outcomes  Rina will benefit from consistent exercise to achieve her stated goals.  The educational and psychoeducational components of this program will be helpful in understanding and coping better with her condition.         Psychosocial Re-Evaluation: Psychosocial Re-Evaluation    [place] Name  06/08/17 1451 06/24/17 1107 07/20/17 1104         Psychosocial Re-Evaluation   Current issues with  Current Stress Concerns  Current Stress Concerns;Current Sleep Concerns  Current Stress Concerns;Current Sleep Concerns     Comments  Sherri Price is doing well in [facility]. Wearing oxygen 24 hours a day is taking its toll on her. I informed her that with her disease oxygen is something she needs to have and that it is a drug that she requires. She informed us that she is taking her medications regularly and has not needed to use her rescue inhaler. Informed her that she is doing all that she can to help with her situation and doing [facility] is an important aspect for her to get better.  Sherri Price takes me[place]in to help her sleep at night. She was recently diagnosed with Lung Cancer. Her doctor told her that they cannot do anything about it. She has a good outlook on her diagnosis. She says she is willing to do whatever it takes to make it through this. Her husband is there for her and makes it alot easier to deal with her condition.  Sherri Price is continuing to take Melatonin and Magnesium every night to help with sleep. She is happy to report that it is helping. Her health is still a concern for her, but she is taking it day by day. She is excited to start the Care class for exercise and support.      Expected Outcomes  Short: attend [facility] to reduce stress. Long: Maintain an exercise program to help with her disease and stress.  Short: talk with her doctor about options. Long: live life one day at a time.  Short: continue to come to [place] and graduate in a couple more sessions. Long: come to Care for support with her diagnosis of lung cancer and continue her melatonin to sleep at night.      Interventions  Encouraged to attend Pulmonary Rehabilitation for the exercise  Encouraged to attend Pulmonary Rehabilitation for the exercise  -     Continue Psychosocial Services   Follow up required by staff  Follow up  required by staff  -        Psychosocial Discharge (Final Psychosocial Re-Evaluation): Psychosocial Re-Evaluation - 07/20/17 1104      Psychosocial Re-Evaluation   Current issues with  Current Stress Concerns;Current Sleep Concerns    Comments  Sherri Price is continuing to take Melatonin and Magnesium every night to help with sleep. She is happy to report that it is helping. Her health is still a concern for her, but she is taking it day by day. She is excited to start the Care class for exercise and support.     Expected Outcomes  Short: continue to come  to [place] and graduate in a couple more sessions. Long: come to Care for support with her diagnosis of lung cancer and continue her melatonin to sleep at night.        Education: Education Goals: Education classes will be provided on a weekly basis, covering required topics. Participant will state understanding/return demonstration of topics presented.  Learning Barriers/Preferences: Learning Barriers/Preferences - 05/05/17 1144      Learning Barriers/Preferences   Learning Barriers  Sight wears glasses    Learning Preferences  None       Education Topics:  Initial Evaluation Education: - Verbal, written and demonstration of respiratory meds, oximetry and breathing techniques. Instruction on use of nebulizers and MDIs and importance of monitoring MDI activations.   Pulmonary Rehab from 07/29/2017 in [facility] Cardiac and Pulmonary Rehab  Date  05/05/17  Educator  [facility]  Instruction Review Code  1- Verbalizes Understanding      General Nutrition Guidelines/Fats and Fiber: -Group instruction provided by verbal, written material, models and posters to present the general guidelines for heart healthy nutrition. Gives an explanation and review of dietary fats and fiber.   Pulmonary Rehab from 07/29/2017 in [facility] Cardiac and Pulmonary Rehab  Date  06/15/17  Educator  CR  Instruction Review Code  1- Verbalizes Understanding       Controlling Sodium/Reading Food Labels: -Group verbal and written material supporting the discussion of sodium use in heart healthy nutrition. Review and explanation with models, verbal and written materials for utilization of the food label.   Pulmonary Rehab from 07/29/2017 in [facility] Cardiac and Pulmonary Rehab  Date  06/22/17  Educator  PI  Instruction Review Code  1- Verbalizes Understanding      Exercise Physiology & General Exercise Guidelines: - Group verbal and written instruction with models to review the exercise physiology of the cardiovascular system and associated critical values. Provides general exercise guidelines with specific guidelines to those with heart or lung disease.    Pulmonary Rehab from 07/29/2017 in [facility] Cardiac and Pulmonary Rehab  Date  05/22/17  Educator  [facility]  Instruction Review Code  1- Verbalizes Understanding      Aerobic Exercise & Resistance Training: - Gives group verbal and written instruction on the various components of exercise. Focuses on aerobic and resistive training programs and the benefits of this training and how to safely progress through these programs.   Pulmonary Rehab from 07/29/2017 in [facility] Cardiac and Pulmonary Rehab  Date  06/05/17  Educator  AS  Instruction Review Code  1- Verbalizes Understanding      Flexibility, Balance, Mind/Body Relaxation: Provides group verbal/written instruction on the benefits of flexibility and balance training, including mind/body exercise modes such as yoga, pilates and tai chi.  Demonstration and skill practice provided.   Pulmonary Rehab from 07/29/2017 in [facility] Cardiac and Pulmonary Rehab  Date  07/01/17  Educator  AS  Instruction Review Code  1- Verbalizes Understanding      Stress and Anxiety: - Provides group verbal and written instruction about the health risks of elevated stress and causes of high stress.  Discuss the correlation between heart/lung disease and anxiety and treatment  options. Review healthy ways to manage with stress and anxiety.   Pulmonary Rehab from 07/29/2017 in [facility] Cardiac and Pulmonary Rehab  Date  07/22/17  Educator  [facility]  Instruction Review Code  1- Verbalizes Understanding      Depression: - Provides group verbal and written instruction on the correlation between heart/lung disease and depressed  mood, treatment options, and the stigmas associated with seeking treatment.   Pulmonary Rehab from 07/29/2017 in [facility] Cardiac and Pulmonary Rehab  Date  07/08/17  Educator  [facility]  Instruction Review Code  1- Verbalizes Understanding      Exercise & Equipment Safety: - Individual verbal instruction and demonstration of equipment use and safety with use of the equipment.   Pulmonary Rehab from 07/29/2017 in [facility] Cardiac and Pulmonary Rehab  Date  05/05/17  Educator  [facility]  Instruction Review Code  1- Verbalizes Understanding      Infection Prevention: - Provides verbal and written material to individual with discussion of infection control including proper hand washing and proper equipment cleaning during exercise session.   Pulmonary Rehab from 07/29/2017 in [facility] Cardiac and Pulmonary Rehab  Date  05/05/17  Educator  [facility]  Instruction Review Code  1- Verbalizes Understanding      Falls Prevention: - Provides verbal and written material to individual with discussion of falls prevention and safety.   Pulmonary Rehab from 07/29/2017 in [facility] Cardiac and Pulmonary Rehab  Date  05/05/17  Educator  [facility]  Instruction Review Code  1- Verbalizes Understanding      Diabetes: - Individual verbal and written instruction to review signs/symptoms of diabetes, desired ranges of glucose level fasting, after meals and with exercise. Advice that pre and post exercise glucose checks will be done for 3 sessions at entry of program.   Chronic Lung Diseases: - Group verbal and written instruction to review updates, respiratory medications, advancements in procedures and  treatments. Discuss use of supplemental oxygen including available portable oxygen systems, continuous and intermittent flow rates, concentrators, personal use and safety guidelines. Review proper use of inhaler and spacers. Provide informative websites for self-education.    Pulmonary Rehab from 07/29/2017 in [facility] Cardiac and Pulmonary Rehab  Date  05/20/17  Educator  [facility]  Instruction Review Code  1- Verbalizes Understanding      Energy Conservation: - Provide group verbal and written instruction for methods to conserve energy, plan and organize activities. Instruct on pacing techniques, use of adaptive equipment and posture/positioning to relieve shortness of breath.   Pulmonary Rehab from 07/29/2017 in [facility] Cardiac and Pulmonary Rehab  Date  07/15/17  Educator  [facility]  Instruction Review Code  1- Verbalizes Understanding      Triggers and Exacerbations: - Group verbal and written instruction to review types of environmental triggers and ways to prevent exacerbations. Discuss weather changes, air quality and the benefits of nasal washing. Review warning signs and symptoms to help prevent infections. Discuss techniques for effective airway clearance, coughing, and vibrations.   Pulmonary Rehab from 07/29/2017 in [facility] Cardiac and Pulmonary Rehab  Date  06/03/17  Educator  [facility]  Instruction Review Code  1- Verbalizes Understanding      AED/CPR: - Group verbal and written instruction with the use of models to demonstrate the basic use of the AED with the basic ABC's of resuscitation.   Pulmonary Rehab from 07/29/2017 in [facility] Cardiac and Pulmonary Rehab  Date  07/17/17  Educator  [facility]  Instruction Review Code  1- Actuary and Physiology of the Lungs: - Group verbal and written instruction with the use of models to provide basic lung anatomy and physiology related to function, structure and complications of lung disease.   Pulmonary Rehab from 07/29/2017 in [facility]  Cardiac and Pulmonary Rehab  Date  07/29/17  Educator  [facility]  Instruction Review Code  1- Verbalizes Understanding      Anatomy & Physiology of the Heart: - Group verbal and written instruction and models provide basic cardiac anatomy and physiology, with the coronary electrical and arterial systems. Review of Valvular disease and Heart Failure   Pulmonary Rehab from 07/29/2017 in [facility] Cardiac and Pulmonary Rehab  Date  06/17/17  Educator  [facility]  Instruction Review Code  1- Verbalizes Understanding      Cardiac Medications: - Group verbal and written instruction to review commonly prescribed medications for heart disease. Reviews the medication, class of the drug, and side effects.   Pulmonary Rehab from 07/29/2017 in [facility] Cardiac and Pulmonary Rehab  Date  07/03/17  Educator  [facility]  Instruction Review Code  1- Verbalizes Understanding      Know Your Numbers and Risk Factors: -Group verbal and written instruction about important numbers in your health.  Discussion of what are risk factors and how they play a role in the disease process.  Review of Cholesterol, Blood Pressure, Diabetes, and BMI and the role they play in your overall health.   Pulmonary Rehab from 07/29/2017 in [facility] Cardiac and Pulmonary Rehab  Date  05/27/17  Educator  [facility]  Instruction Review Code  1- Verbalizes Understanding      Sleep Hygiene: -Provides group verbal and written instruction about how sleep can affect your health.  Define sleep hygiene, discuss sleep cycles and impact of sleep habits. Review good sleep hygiene tips.    Other: -Provides group and verbal instruction on various topics (see comments)   Pulmonary Rehab from 07/29/2017 in [facility] Cardiac and Pulmonary Rehab  Date  06/10/17  Educator  [facility]  Instruction Review Code  1- Verbalizes Understanding [SLEEP]       Knowledge Questionnaire Score: Knowledge Questionnaire Score - 07/22/17 1204      Knowledge Questionnaire Score   Pre Score  16/18     Post Score  15/18 reviewed with patient        Core Components/Risk Factors/Patient Goals at Admission: Personal Goals and Risk Factors at Admission - 05/05/17 1146      Core Components/Risk Factors/Patient Goals on Admission    Weight Management  Weight Maintenance;Yes    Intervention  Weight Management: Develop a combined nutrition and exercise program designed to reach desired caloric intake, while maintaining appropriate intake of nutrient and fiber, sodium and fats, and appropriate energy expenditure required for the weight goal.;Weight Management/Obesity: Establish reasonable short term and long term weight goals.;Weight Management: Provide education and appropriate resources to help participant work on and attain dietary goals.    Admit Weight  100 lb 8 oz (45.6 kg)    Goal Weight: Short Term  100 lb (45.4 kg)    Goal Weight: Long Term  100 lb (45.4 kg)    Expected Outcomes  Short Term: Continue to assess and modify interventions until short term weight is achieved;Long Term: Adherence to nutrition and physical activity/exercise program aimed toward attainment of established weight goal;Weight Maintenance: Understanding of the daily nutrition guidelines, which includes 25-35% calories from fat, 7% or less cal from saturated fats, less than '200mg'$  cholesterol, less than 1.5gm of sodium, & 5 or more servings of fruits and vegetables daily    Improve shortness of breath with ADL's  Yes    Intervention  Provide education, individualized exercise plan and daily activity instruction to help decrease symptoms of SOB with activities of daily living.    Expected Outcomes  Short Term: Achieves a reduction of symptoms  when performing activities of daily living.       Core Components/Risk Factors/Patient Goals Review:  Goals and Risk Factor Review    Row Name 06/24/17 1058 07/20/17 1049           Core Components/Risk Factors/Patient Goals Review   Personal Goals Review  Weight  Management/Obesity;Improve shortness of breath with ADL's  Weight Management/Obesity;Improve shortness of breath with ADL's      Review  Nea states that she cannot do housework like she used to. When she cooks and does some dishes she does not get too short of breath. She focuses on not walking too fast so she does not get short of breath.  Sherri Price is maintaining her weight around 102 lb. Her breathing has improved a little bit, especially when using her PLB technique. However, she knows due to her disease that her breathing will not get too much better. She has noticed an improvement in her energy as well, as she focuses on taking things slower and spacing out her housework.       Expected Outcomes  Short: gain some weight, improve her ADLS with exercise. Long: maintain weight gain.  Short: maintain weight and continue to work on SOB with ADLS by continueing to exercise. Long: graduate from [facility] and enroll in Care to continue to work on shortness of breath.          Core Components/Risk Factors/Patient Goals at Discharge (Final Review):  Goals and Risk Factor Review - 07/20/17 1049      Core Components/Risk Factors/Patient Goals Review   Personal Goals Review  Weight Management/Obesity;Improve shortness of breath with ADL's    Review  Sherri Price is maintaining her weight around 102 lb. Her breathing has improved a little bit, especially when using her PLB technique. However, she knows due to her disease that her breathing will not get too much better. She has noticed an improvement in her energy as well, as she focuses on taking things slower and spacing out her housework.     Expected Outcomes  Short: maintain weight and continue to work on SOB with ADLS by continueing to exercise. Long: graduate from [facility] and enroll in Care to continue to work on shortness of breath.        ITP Comments: ITP Comments    Row Name 05/05/17 1102 05/15/17 1027 06/01/17 0822 06/22/17 1053 06/29/17 0918   ITP  Comments  Medical Evaluation completed. Chart sent for review and changes to Dr. Emily Filbert Director of [place]. Diagnosis can be found in [facility] encounter 05/05/17  Patient needs to use 6 liters when exercising. Her oxygen on 4 liters was below 85% on the treadmill. Will send note to Dr. Lequita Halt for an Oxygen order for 6 liters.  30 day review completed. ITP sent to Dr. Emily Filbert Director of [place]. Continue with ITP unless changes are made by physician.  Audelia Acton stated that her Doctors appointment did not go well on Friday and that she has Cancer of the Lung. She states that there is nothing they could do for it.  30 day review completed. ITP sent to Dr. Emily Filbert Director of [place]. Continue with ITP unless changes are made by physician.   [place] Name 07/27/17 0826           ITP Comments  30 day review completed. ITP sent to Dr. Emily Filbert Director of [place]. Continue with ITP unless changes are made by physician.          Comments:  Discharge ITP

## 2017-08-05 NOTE — Progress Notes (Signed)
Daily Session Note  Patient Details  Name: Sherri Price MRN: [id number] Date of Birth: 16-Jul-1933 Referring Provider:     Pulmonary Rehab from 05/05/2017 in [facility] Cardiac and Pulmonary Rehab  Referring Provider  Caryl Comes      Encounter Date: 08/05/2017  Check In: Session Check In - 08/05/17 1009      Check-In   Location  [facility]-Cardiac & Pulmonary Rehab    Staff Present  Nada Maclachlan, BA, ACSM CEP, Exercise Physiologist;Mivaan Corbitt Darrin Nipper, [place], RCEP, CCRP, Exercise Physiologist    Supervising physician immediately available to respond to emergencies  [facility] immediately available ER MD    Physician(s)  Dr. Joni Fears and Jimmye Norman    Medication changes reported      No    Fall or balance concerns reported     No    Tobacco Cessation  No Change    Warm-up and Cool-down  Performed as group-led instruction    Resistance Training Performed  Yes    VAD Patient?  No      Pain Assessment   Currently in Pain?  No/denies          Social History   Tobacco Use  Smoking Status Never Smoker  Smokeless Tobacco Never Used    Goals Met:  Proper associated with RPD/PD & O2 Sat [place] with exercise equipment Using PLB without cueing & demonstrates good technique Exercise tolerated well No report of cardiac concerns or symptoms Strength training completed today  Goals Unmet:  Not Applicable  Comments:  [place] Name 05/05/17 1256 08/03/17 1039 08/05/17 1045     6 Minute Walk   Phase  -  Discharge  Discharge   Distance  880 feet  770 feet  912 feet   Distance % Change  -  -12 %  3 %   Distance Feet Change  -  -110 ft  32 ft   Walk Time  6 minutes  6 minutes  6 minutes   # of Rest Breaks  0  0  0   MPH  1.67  1.46  1.72   METS  1.51  1.42  1.65   RPE  12  12  13    Perceived Dyspnea   2  2  2    VO2 Peak  5.28  4.97  5.77   Symptoms  No  No  No   Resting HR  80 bpm  83 bpm  -   Resting BP  106/52  108/56  -   Resting Oxygen Saturation    92 %  96 %  -   Exercise Oxygen Saturation  during 6 min walk  91 %  88 %  -   Max Ex. HR  84 bpm  89 bpm  -   Max Ex. BP  116/54  128/64  -   2 Minute Post BP  96/54  -  -     Interval HR   1 Minute HR  81  89  -   2 Minute HR  84  85  -   3 Minute HR  83  85  -   4 Minute HR  82  82  -   5 Minute HR  84  85  -   6 Minute HR  82  85  -   2 Minute Post HR  76  82  -   Interval Heart Rate?  Yes  Yes  -  Interval Oxygen   Interval Oxygen?  Yes  Yes  -   Baseline Oxygen Saturation %  92 %  96 %  -   1 Minute Oxygen Saturation %  94 %  94 %  -   1 Minute Liters of Oxygen  3 L pulsed - own tank  4 L  -   2 Minute Oxygen Saturation %  94 %  91 %  -   2 Minute Liters of Oxygen  3 L  4 L  -   3 Minute Oxygen Saturation %  94 %  91 %  -   3 Minute Liters of Oxygen  3 L  4 L  -   4 Minute Oxygen Saturation %  92 %  90 %  -   4 Minute Liters of Oxygen  3 L  4 L  -   5 Minute Oxygen Saturation %  91 %  88 %  -   5 Minute Liters of Oxygen  3 L  4 L  -   6 Minute Oxygen Saturation %  93 %  88 %  -   6 Minute Liters of Oxygen  3 L  4 L  -   2 Minute Post Oxygen Saturation %  96 %  95 %  -   2 Minute Post Liters of Oxygen  3 L  4 L  -     [name] graduated today from  rehab with 36 sessions completed.  Details of the patient's exercise prescription and what She needs to do in order to continue the prescription and progress were discussed with patient.  Patient was given a copy of prescription and goals.  Patient verbalized understanding.  [name] plans to continue to exercise by joining the Care program.   Dr. Emily Filbert is Medical Director for [place] and [facility].

## 2017-12-02 ENCOUNTER — Other Ambulatory Visit: Payer: Self-pay | Admitting: Internal Medicine

## 2017-12-02 DIAGNOSIS — R42 Dizziness and giddiness: Secondary | ICD-10-CM

## 2017-12-11 ENCOUNTER — Ambulatory Visit
Admission: RE | Admit: 2017-12-11 | Discharge: 2017-12-11 | Disposition: A | Discharge status: Home / Self Care | Payer: Medicare Other | Source: Ambulatory Visit | Attending: Internal Medicine | Admitting: Internal Medicine

## 2017-12-11 DIAGNOSIS — R42 Dizziness and giddiness: Secondary | ICD-10-CM | POA: Diagnosis present

## 2017-12-11 DIAGNOSIS — I6521 Occlusion and stenosis of right carotid artery: Secondary | ICD-10-CM | POA: Insufficient documentation

## 2017-12-22 ENCOUNTER — Encounter: Payer: Medicare Other | Attending: Internal Medicine

## 2017-12-22 ENCOUNTER — Other Ambulatory Visit: Payer: Self-pay

## 2017-12-22 VITALS — Ht 59.2 in | Wt 99.3 lb

## 2017-12-22 DIAGNOSIS — J8410 Pulmonary fibrosis, unspecified: Secondary | ICD-10-CM | POA: Insufficient documentation

## 2017-12-22 DIAGNOSIS — K219 Gastro-esophageal reflux disease without esophagitis: Secondary | ICD-10-CM | POA: Diagnosis not present

## 2017-12-22 DIAGNOSIS — E039 Hypothyroidism, unspecified: Secondary | ICD-10-CM | POA: Insufficient documentation

## 2017-12-22 DIAGNOSIS — Z79899 Other long term (current) drug therapy: Secondary | ICD-10-CM | POA: Diagnosis not present

## 2017-12-22 DIAGNOSIS — Z79890 Hormone replacement therapy (postmenopausal): Secondary | ICD-10-CM | POA: Insufficient documentation

## 2017-12-22 DIAGNOSIS — R0602 Shortness of breath: Secondary | ICD-10-CM | POA: Diagnosis not present

## 2017-12-22 DIAGNOSIS — I272 Pulmonary hypertension, unspecified: Secondary | ICD-10-CM | POA: Insufficient documentation

## 2017-12-22 NOTE — Progress Notes (Signed)
Daily Session Note  Patient Details  Name: Sherri Price MRN: [id number] Date of Birth: March 27, 1934 Referring Provider:     Pulmonary Rehab from 12/22/2017 in [facility] Cardiac and Pulmonary Rehab  Referring Provider  Ramonita Lab MD      Encounter Date: 12/22/2017  Check In: Session Check In - 12/22/17 1215      Check-In   Supervising physician immediately available to respond to emergencies  [facility] immediately available ER MD    Physician(s)  Dr. Mable Paris and Corky Downs    Location  [facility]-Cardiac & Pulmonary Rehab    Staff Present  Justin Mend RCP,RRT,BSRT;Jessica Luan [place], [place], RCEP, CCRP, Exercise Physiologist    Medication changes reported      No    Fall or balance concerns reported     No    Warm-up and Cool-down  Not performed (comment) Medical Evaluation    Resistance Training Performed  No    VAD Patient?  No      Pain Assessment   Currently in Pain?  No/denies        Exercise Prescription Changes - 12/22/17 1400      Response to Exercise   Blood Pressure (Admit)  124/62    Blood Pressure (Exercise)  128/70    Blood Pressure (Exit)  122/66    Heart Rate (Admit)  68 bpm    Heart Rate (Exercise)  89 bpm    Heart Rate (Exit)  69 bpm    Oxygen Saturation (Admit)  92 %    Oxygen Saturation (Exercise)  83 %    Oxygen Saturation (Exit)  90 %    Rating of Perceived Exertion (Exercise)  12    Perceived Dyspnea (Exercise)  2    Symptoms  SOB    Comments  walk test results       Social History   Tobacco Use  Smoking Status Never Smoker  Smokeless Tobacco Never Used    Goals Met:  Exercise tolerated well Personal goals reviewed No report of cardiac concerns or symptoms Strength training completed today  Goals Unmet:  Not Applicable  Comments: Service Time [id number]   Dr. Emily Filbert is Medical Director for [place] and [facility].

## 2017-12-22 NOTE — Progress Notes (Signed)
Pulmonary Individual Treatment Plan  Patient Details  Name: Sherri Price MRN: [id number] Date of Birth: 10/02/1933 Referring Provider:     Pulmonary Rehab from 12/22/2017 in [facility] and Pul[facility] Re[facility]  Referring Provider  Klein, Bert MD      Initial Encounter Date:    Pulmonary Rehab from 12/22/2017 in [facility] and Pul[facility] Rehab  Date  12/22/17      Visit Diagnosis: Pulmonary hypertension (HCC)  Patient's Home Medications on Admission:  Current Outpatient Medications:  .  Ascorbic Acid (VITAMIN C) 1000 MG tablet, Take 1,000 mg by mouth 2 (two) times daily., Disp: , Rfl:  .  Cholecalciferol (VITAMIN D3) 5000 UNITS CAPS, Take 1 capsule by mouth daily., Disp: , Rfl:  .  Coenzyme Q10 100 MG capsule, Take 100 mg by mouth daily. , Disp: , Rfl:  .  Cranberry Fruit 405 MG CAPS, Take 1 capsule by mouth 2 (two) times daily. , Disp: , Rfl:  .  Evening Primrose Oil 500 MG CAPS, Take 1 capsule by mouth 2 (two) times daily. , Disp: , Rfl:  .  FLUOCINOLONE ACETONIDE SCALP 0.01 % OIL, 1 application by Other route daily as needed (psoriasis on scalp). , Disp: , Rfl:  .  L-Lysine 1000 MG TABS, Take 1 tablet by mouth 2 (two) times daily. , Disp: , Rfl:  .  levothyroxine (LEVOXYL) 100 MCG tablet, Take 100 mcg by mouth daily before breakfast. , Disp: , Rfl:  .  Multiple Vitamin (MULTIVITAMIN WITH MINERALS) TABS tablet, Take 1 tablet by mouth daily., Disp: , Rfl:  .  NON FORMULARY, Take 1 tablet by mouth 2 (two) times daily. calcium hydroxyapatite, Disp: , Rfl:  .  NON FORMULARY, Take 1 capsule by mouth 2 (two) times daily. With lunch and dinner. lactate prophase papain bromein, Disp: , Rfl:  .  NON FORMULARY, Take 1 tablet by mouth 2 (two) times daily. Quercetine, Disp: , Rfl:  .  NON FORMULARY, Take 1 Dose by mouth daily. Mixes these 3 OTC triple fiber, fiber smart, psyllium in a cup of juice every morning, Disp: , Rfl:  .  NON FORMULARY, Take 1 capsule by mouth 2 (two) times daily. Eye  health with lutein, Disp: , Rfl:  .  omeprazole (PRILOSEC) 20 MG capsule, Take 20 mg by mouth daily. , Disp: , Rfl:  .  Probiotic Product (PROBIOTIC DAILY PO), Take 2 tablets by mouth at bedtime., Disp: , Rfl:  .  Red Yeast Rice Extract 600 MG CAPS, Take 1 capsule by mouth 2 (two) times daily. , Disp: , Rfl:  .  rivaroxaban (XARELTO) 20 MG TABS tablet, Take 20 mg by mouth 2 (two) times daily at 10 AM and 5 PM., Disp: , Rfl:  .  TURMERIC CURCUMIN PO, Take 1 capsule by mouth daily. Use. One bid, Disp: , Rfl:  .  vitamin E 400 UNIT capsule, Take 400 Units by mouth 2 (two) times daily. , Disp: , Rfl:   Past Medical History: Past Medical History:  Diagnosis Date  . Arthritis   . Bell's palsy   . Cough    CHRONIC  . GERD (gastroesophageal reflux disease)   . Hypothyroidism   . IBS (irritable bowel syndrome)   . Neuropathy   . Oxygen decrease    USES AT NIGHT  . Pulmonary fibrosis (HCC)   . Shortness of breath dyspnea     Tobacco Use: Social History   Tobacco Use  Smoking Status Never Smoker  Smokeless Tobacco   Never Used    Labs: Recent Review Flowsheet Data    There is no flowsheet data to display.       Pulmonary Assessment Scores: Pulmonary Assessment Scores    Row Name 12/22/17 1225         ADL UCSD   ADL Phase  Entry     SOB Score total  74     Rest  4     Walk  4     Stairs  3     Bath  4     Dress  4     Shop  4       CAT Score   CAT Score  12       mMRC Score   mMRC Score  2        Pulmonary Function Assessment: Pulmonary Function Assessment - 12/22/17 1323      Breath   Bilateral Breath Sounds  Clear    Shortness of Breath  Fear of Shortness of Breath;Panic with Shortness of Breath;Limiting activity;Yes       Exercise Target Goals: Date: 12/22/17  Exercise Program Goal: Individual exercise prescription set using results from initial 6 min walk test and THRR while considering  patient's activity barriers and safety.    Exercise  Prescription Goal: Initial exercise prescription builds to 30-45 minutes a day of aerobic activity, 2-3 days per week.  Home exercise guidelines will be given to patient during program as part of exercise prescription that the participant will acknowledge.  Activity Barriers & Risk Stratification: Activity Barriers & Cardiac Risk Stratification - 12/22/17 1428      Activity Barriers & Cardiac Risk Stratification   Activity Barriers  Deconditioning;Muscular Weakness;Shortness of Breath       6 Minute Walk: 6 Minute Walk    Row Name 12/22/17 1425         6 Minute Walk   Phase  Initial     Distance  688 feet     Walk Time  6 minutes     # of Rest Breaks  0     MPH  1.3     METS  1.29     RPE  12     Perceived Dyspnea   2     VO2 Peak  4.51     Symptoms  Yes (comment)     Comments  SOB     Resting HR  68 bpm     Resting BP  124/62     Resting Oxygen Saturation   92 %     Exercise Oxygen Saturation  during 6 min walk  83 %     Max Ex. HR  89 bpm     Max Ex. BP  128/70     2 Minute Post BP  122/66       Interval HR   1 Minute HR  80     2 Minute HR  82     3 Minute HR  83     4 Minute HR  82     5 Minute HR  81     6 Minute HR  89     2 Minute Post HR  69     Interval Heart Rate?  Yes       Interval Oxygen   Interval Oxygen?  Yes     Baseline Oxygen Saturation %  92 %     1 Minute Oxygen Saturation %  88 %  1 Minute Liters of Oxygen  4 L     2 Minute Oxygen Saturation %  86 % 2:26 85%     2 Minute Liters of Oxygen  4 L     3 Minute Oxygen Saturation %  85 %     3 Minute Liters of Oxygen  4 L     4 Minute Oxygen Saturation %  84 % 4:20 83%     4 Minute Liters of Oxygen  4 L     5 Minute Oxygen Saturation %  86 %     5 Minute Liters of Oxygen  4 L     6 Minute Oxygen Saturation %  85 %     6 Minute Liters of Oxygen  4 L     2 Minute Post Oxygen Saturation %  90 %     2 Minute Post Liters of Oxygen  4 L       Oxygen Initial Assessment: Oxygen Initial  Assessment - 12/22/17 1302      Home Oxygen   Home Oxygen Device  Home Concentrator;E-Tanks    Sleep Oxygen Prescription  Continuous    Liters per minute  3    Home Exercise Oxygen Prescription  Continuous    Liters per minute  4    Home at Rest Exercise Oxygen Prescription  Continuous    Liters per minute  4    Compliance with Home Oxygen Use  Yes      Initial 6 min Walk   Oxygen Used  Continuous    Liters per minute  4      Program Oxygen Prescription   Program Oxygen Prescription  Continuous    Liters per minute  4      Intervention   Short Term Goals  To learn and understand importance of monitoring SPO2 with pulse oximeter and demonstrate accurate use of the pulse oximeter.;To learn and demonstrate proper pursed lip breathing techniques or other breathing techniques.;To learn and understand importance of maintaining oxygen saturations>88%;To learn and demonstrate proper use of respiratory medications;To learn and exhibit compliance with exercise, home and travel O2 prescription    Long  Term Goals  Exhibits compliance with exercise, home and travel O2 prescription;Verbalizes importance of monitoring SPO2 with pulse oximeter and return demonstration;Maintenance of O2 saturations>88%;Exhibits proper breathing techniques, such as pursed lip breathing or other method taught during program session;Compliance with respiratory medication;Demonstrates proper use of MDI's       Oxygen Re-Evaluation:   Oxygen Discharge (Final Oxygen Re-Evaluation):   Initial Exercise Prescription: Initial Exercise Prescription - 12/22/17 1400      Date of Initial Exercise RX and Referring Provider   Date  12/22/17    Referring Provider  Ramonita Lab MD      Oxygen   Oxygen  Continuous    Liters  4-6      Treadmill   MPH  1.2    Grade  0.5    Minutes  15    METs  2      NuStep   Level  4    SPM  80    Minutes  15    METs  2      Biostep-RELP   Level  4    SPM  50    Minutes  15     METs  2      Prescription Details   Frequency (times per week)  3    Duration  Progress to 45  minutes of aerobic exercise without signs/symptoms of physical distress      Intensity   THRR 40-80% of Max Heartrate  96-125    Ratings of Perceived Exertion  11-13    Perceived Dyspnea  0-4      Progression   Progression  Continue to progress workloads to maintain intensity without signs/symptoms of physical distress.      Resistance Training   Training Prescription  Yes    Weight  2 lbs    Reps  10-15       Perform Capillary Blood Glucose checks as needed.  Exercise Prescription Changes: Exercise Prescription Changes    Row Name 12/22/17 1400             Response to Exercise   Blood Pressure (Admit)  124/62       Blood Pressure (Exercise)  128/70       Blood Pressure (Exit)  122/66       Heart Rate (Admit)  68 bpm       Heart Rate (Exercise)  89 bpm       Heart Rate (Exit)  69 bpm       Oxygen Saturation (Admit)  92 %       Oxygen Saturation (Exercise)  83 %       Oxygen Saturation (Exit)  90 %       Rating of Perceived Exertion (Exercise)  12       Perceived Dyspnea (Exercise)  2       Symptoms  SOB       Comments  walk test results          Exercise Comments:   Exercise Goals and Review: Exercise Goals    Row Name 12/22/17 1433             Exercise Goals   Increase Physical Activity  Yes       Intervention  Provide advice, education, support and counseling about physical activity/exercise needs.;Develop an individualized exercise prescription for aerobic and resistive training based on initial evaluation findings, risk stratification, comorbidities and participant's personal goals.       Expected Outcomes  Short Term: Attend rehab on a regular basis to increase amount of physical activity.;Long Term: Add in home exercise to make exercise part of routine and to increase amount of physical activity.;Long Term: Exercising regularly at least 3-5 days a week.        Increase Strength and Stamina  Yes       Intervention  Provide advice, education, support and counseling about physical activity/exercise needs.;Develop an individualized exercise prescription for aerobic and resistive training based on initial evaluation findings, risk stratification, comorbidities and participant's personal goals.       Expected Outcomes  Short Term: Increase workloads from initial exercise prescription for resistance, speed, and METs.;Short Term: Perform resistance training exercises routinely during rehab and add in resistance training at home;Long Term: Improve cardiorespiratory fitness, muscular endurance and strength as measured by increased METs and functional capacity (6MWT)       Able to understand and use rate of perceived exertion (RPE) scale  Yes       Intervention  Provide education and explanation on how to use RPE scale       Expected Outcomes  Long Term:  Able to use RPE to guide intensity level when exercising independently;Short Term: Able to use RPE daily in rehab to express subjective intensity level       Able to understand and  use Dyspnea scale  Yes       Intervention  Provide education and explanation on how to use Dyspnea scale       Expected Outcomes  Short Term: Able to use Dyspnea scale daily in rehab to express subjective sense of shortness of breath during exertion;Long Term: Able to use Dyspnea scale to guide intensity level when exercising independently       Knowledge and understanding of Target Heart Rate Range (THRR)  Yes       Intervention  Provide education and explanation of THRR including how the numbers were predicted and where they are located for reference       Expected Outcomes  Short Term: Able to state/look up THRR;Long Term: Able to use THRR to govern intensity when exercising independently;Short Term: Able to use daily as guideline for intensity in rehab       Able to check pulse independently  Yes       Intervention  Provide  education and demonstration on how to check pulse in carotid and radial arteries.;Review the importance of being able to check your own pulse for safety during independent exercise       Expected Outcomes  Short Term: Able to explain why pulse checking is important during independent exercise;Long Term: Able to check pulse independently and accurately       Understanding of Exercise Prescription  Yes       Intervention  Provide education, explanation, and written materials on patient's individual exercise prescription       Expected Outcomes  Short Term: Able to explain program exercise prescription;Long Term: Able to explain home exercise prescription to exercise independently          Exercise Goals Re-Evaluation :   Discharge Exercise Prescription (Final Exercise Prescription Changes): Exercise Prescription Changes - 12/22/17 1400      Response to Exercise   Blood Pressure (Admit)  124/62    Blood Pressure (Exercise)  128/70    Blood Pressure (Exit)  122/66    Heart Rate (Admit)  68 bpm    Heart Rate (Exercise)  89 bpm    Heart Rate (Exit)  69 bpm    Oxygen Saturation (Admit)  92 %    Oxygen Saturation (Exercise)  83 %    Oxygen Saturation (Exit)  90 %    Rating of Perceived Exertion (Exercise)  12    Perceived Dyspnea (Exercise)  2    Symptoms  SOB    Comments  walk test results       Nutrition:  Target Goals: Understanding of nutrition guidelines, daily intake of sodium <1528m, cholesterol <2080m calories 30% from fat and 7% or less from saturated fats, daily to have 5 or more servings of fruits and vegetables.  Biometrics: Pre Biometrics - 12/22/17 1434      Pre Biometrics   Height  4' 11.2" (1.504 m)    Weight  99 lb 4.8 oz (45 kg)    Waist Circumference  27 inches    Hip Circumference  35.5 inches    Waist to Hip Ratio  0.76 %    BMI (Calculated)  19.91    Single Leg Stand  2.43 seconds        Nutrition Therapy Plan and Nutrition Goals: Nutrition Therapy &  Goals - 12/22/17 1303      Personal Nutrition Goals   Nutrition Goal  Maintain weight, get enough protien    Comments  Maintain weight and eat a health diet  to help wuth her breathing.      Intervention Plan   Intervention  Prescribe, educate and counsel regarding individualized specific dietary modifications aiming towards targeted core components such as weight, hypertension, lipid management, diabetes, heart failure and other comorbidities.    Expected Outcomes  Short Term Goal: Understand basic principles of dietary content, such as calories, fat, sodium, cholesterol and nutrients.;Long Term Goal: Adherence to prescribed nutrition plan.       Nutrition Assessments: Nutrition Assessments - 12/22/17 1229      MEDFICTS Scores   Pre Score  13       Nutrition Goals Re-Evaluation:   Nutrition Goals Discharge (Final Nutrition Goals Re-Evaluation):   Psychosocial: Target Goals: Acknowledge presence or absence of significant depression and/or stress, maximize coping skills, provide positive support system. Participant is able to verbalize types and ability to use techniques and skills needed for reducing stress and depression.   Initial Review & Psychosocial Screening: Initial Psych Review & Screening - 12/22/17 1304      Initial Review   Current issues with  Current Stress Concerns    Source of Stress Concerns  Chronic Illness    Comments  Patient requires more oxygen than the last time she was in [place].      Family Dynamics   Good Support System?  Yes    Comments  Her husband is a good support system. Most of her family is in [place].      Barriers   Psychosocial barriers to participate in program  The patient should benefit from training in stress management and relaxation.      Screening Interventions   Interventions  Encouraged to exercise;Program counselor consult;Provide feedback about the scores to participant;To provide support and resources with identified  psychosocial needs    Expected Outcomes  Short Term goal: Identification and review with participant of any Quality of Life or Depression concerns found by scoring the questionnaire.;Long Term goal: The participant improves quality of Life and PHQ9 Scores as seen by post scores and/or verbalization of changes;Long Term Goal: Stressors or current issues are controlled or eliminated.;Short Term goal: Utilizing psychosocial counselor, staff and physician to assist with identification of specific Stressors or current issues interfering with healing process. Setting desired goal for each stressor or current issue identified.       Quality of Life Scores:  Scores of 19 and below usually indicate a poorer quality of life in these areas.  A difference of  2-3 points is a clinically meaningful difference.  A difference of 2-3 points in the total score of the Quality of Life Index has been associated with significant improvement in overall quality of life, self-image, physical symptoms, and general health in studies assessing change in quality of life.  PHQ-9: Recent Review Flowsheet Data    Depression screen [facility] 2/9 12/22/2017 07/22/2017 06/24/2017 05/05/2017 04/30/2017   Decreased Interest _0 0   Down, Depressed, Hopeless _1 0 0   PHQ - 2 Score _2 0   Altered sleeping _3 -   Tired, decreased energy _4 -   Change in appetite 1 1 0 0 -   Feeling bad or failure about yourself  0 0 0 0 -   Trouble concentrating 0 0 0 0 -   Moving slowly or fidgety/restless 1 0 0 0 -   Suicidal thoughts 0 0 0 0 -   PHQ-9 Score _5 -  Difficult doing work/chores Somewhat difficult - Somewhat difficult Very difficult -     Interpretation of Total Score  Total Score Depression Severity:  1-4 = Minimal depression, 5-9 = Mild depression, 10-14 = Moderate depression, 15-19 = Moderately severe depression, 20-27 = Severe depression   Psychosocial Evaluation and Intervention:   Psychosocial  Re-Evaluation:   Psychosocial Discharge (Final Psychosocial Re-Evaluation):   Education: Education Goals: Education classes will be provided on a weekly basis, covering required topics. Participant will state understanding/return demonstration of topics presented.  Learning Barriers/Preferences: Learning Barriers/Preferences - 12/22/17 1228      Learning Barriers/Preferences   Learning Barriers  Sight wears glasses    Learning Preferences  None       Education Topics:  Initial Evaluation Education: - Verbal, written and demonstration of respiratory meds, oximetry and breathing techniques. Instruction on use of nebulizers and MDIs and importance of monitoring MDI activations.   Pulmonary Rehab from 12/22/2017 in [facility] Pulmonary Rehab  Date  12/22/17  Educator  [facility]  Instruction Review Code  1- Verbalizes Understanding      General Nutrition Guidelines/Fats and Fiber: -Group instruction provided by verbal, written material, models and posters to present the general guidelines for heart healthy nutrition. Gives an explanation and review of dietary fats and fiber.   Pulmonary Rehab from 07/29/2017 in [facility] Cardiac and Pulmonary Rehab  Date  06/15/17  Educator  CR  Instruction Review Code  1- Verbalizes Understanding      Controlling Sodium/Reading Food Labels: -Group verbal and written material supporting the discussion of sodium use in heart healthy nutrition. Review and explanation with models, verbal and written materials for utilization of the food label.   Pulmonary Rehab from 07/29/2017 in [facility] Cardiac and Pulmonary Rehab  Date  06/22/17  Educator  PI  Instruction Review Code  1- Verbalizes Understanding      Exercise Physiology & General Exercise Guidelines: - Group verbal and written instruction with models to review the exercise physiology of the cardiovascular system and associated critical values. Provides general exercise guidelines with specific guidelines  to those with heart or lung disease.    Pulmonary Rehab from 07/29/2017 in [facility] Cardiac and Pulmonary Rehab  Date  05/22/17  Educator  [facility]  Instruction Review Code  1- Verbalizes Understanding      Aerobic Exercise & Resistance Training: - Gives group verbal and written instruction on the various components of exercise. Focuses on aerobic and resistive training programs and the benefits of this training and how to safely progress through these programs.   Pulmonary Rehab from 07/29/2017 in [facility] Cardiac and Pulmonary Rehab  Date  06/05/17  Educator  AS  Instruction Review Code  1- Verbalizes Understanding      Flexibility, Balance, Mind/Body Relaxation: Provides group verbal/written instruction on the benefits of flexibility and balance training, including mind/body exercise modes such as yoga, pilates and tai chi.  Demonstration and skill practice provided.   Pulmonary Rehab from 07/29/2017 in [facility] Cardiac and Pulmonary Rehab  Date  07/01/17  Educator  AS  Instruction Review Code  1- Verbalizes Understanding      Stress and Anxiety: - Provides group verbal and written instruction about the health risks of elevated stress and causes of high stress.  Discuss the correlation between heart/lung disease and anxiety and treatment options. Review healthy ways to manage with stress and anxiety.   Pulmonary Rehab from 07/29/2017 in [facility] Cardiac and Pulmonary Rehab  Date  07/22/17  Educator  Lupita Leash  Instruction Review Code  1- Verbalizes Understanding      Depression: - Provides group verbal and written instruction on the correlation between heart/lung disease and depressed mood, treatment options, and the stigmas associated with seeking treatment.   Pulmonary Rehab from 07/29/2017 in [facility] Cardiac and Pulmonary Rehab  Date  07/08/17  Educator  [facility]  Instruction Review Code  1- Verbalizes Understanding      Exercise & Equipment Safety: - Individual verbal instruction and demonstration of  equipment use and safety with use of the equipment.   Pulmonary Rehab from 12/22/2017 in [facility] Cardiac and Pulmonary Rehab  Date  12/22/17  Educator  [facility]  Instruction Review Code  1- Verbalizes Understanding      Infection Prevention: - Provides verbal and written material to individual with discussion of infection control including proper hand washing and proper equipment cleaning during exercise session.   Pulmonary Rehab from 12/22/2017 in [facility] Cardiac and Pulmonary Rehab  Date  12/22/17  Educator  [facility]  Instruction Review Code  1- Verbalizes Understanding      Falls Prevention: - Provides verbal and written material to individual with discussion of falls prevention and safety.   Pulmonary Rehab from 12/22/2017 in [facility] Cardiac and Pulmonary Rehab  Date  12/22/17  Educator  [facility]  Instruction Review Code  1- Verbalizes Understanding      Diabetes: - Individual verbal and written instruction to review signs/symptoms of diabetes, desired ranges of glucose level fasting, after meals and with exercise. Advice that pre and post exercise glucose checks will be done for 3 sessions at entry of program.   Chronic Lung Diseases: - Group verbal and written instruction to review updates, respiratory medications, advancements in procedures and treatments. Discuss use of supplemental oxygen including available portable oxygen systems, continuous and intermittent flow rates, concentrators, personal use and safety guidelines. Review proper use of inhaler and spacers. Provide informative websites for self-education.    Pulmonary Rehab from 07/29/2017 in [facility] Cardiac and Pulmonary Rehab  Date  05/20/17  Educator  [facility]  Instruction Review Code  1- Verbalizes Understanding      Energy Conservation: - Provide group verbal and written instruction for methods to conserve energy, plan and organize activities. Instruct on pacing techniques, use of adaptive equipment and posture/positioning to relieve shortness of  breath.   Pulmonary Rehab from 07/29/2017 in [facility] Cardiac and Pulmonary Rehab  Date  07/15/17  Educator  [facility]  Instruction Review Code  1- Verbalizes Understanding      Triggers and Exacerbations: - Group verbal and written instruction to review types of environmental triggers and ways to prevent exacerbations. Discuss weather changes, air quality and the benefits of nasal washing. Review warning signs and symptoms to help prevent infections. Discuss techniques for effective airway clearance, coughing, and vibrations.   Pulmonary Rehab from 07/29/2017 in [facility] Cardiac and Pulmonary Rehab  Date  06/03/17  Educator  [facility]  Instruction Review Code  1- Verbalizes Understanding      AED/CPR: - Group verbal and written instruction with the use of models to demonstrate the basic use of the AED with the basic ABC's of resuscitation.   Pulmonary Rehab from 07/29/2017 in [facility] Cardiac and Pulmonary Rehab  Date  07/17/17  Educator  [facility]  Instruction Review Code  1- Actuary and Physiology of the Lungs: - Group verbal and written instruction with the use of models to provide basic lung anatomy and physiology related to function, structure and  complications of lung disease.   Pulmonary Rehab from 07/29/2017 in [facility] Cardiac and Pulmonary Rehab  Date  07/29/17  Educator  [facility]  Instruction Review Code  1- Verbalizes Understanding      Anatomy & Physiology of the Heart: - Group verbal and written instruction and models provide basic cardiac anatomy and physiology, with the coronary electrical and arterial systems. Review of Valvular disease and Heart Failure   Pulmonary Rehab from 07/29/2017 in [facility] Cardiac and Pulmonary Rehab  Date  06/17/17  Educator  [facility]  Instruction Review Code  1- Verbalizes Understanding      Cardiac Medications: - Group verbal and written instruction to review commonly prescribed medications for heart disease. Reviews the medication, class of the drug,  and side effects.   Pulmonary Rehab from 07/29/2017 in [facility] Cardiac and Pulmonary Rehab  Date  07/03/17  Educator  [facility]  Instruction Review Code  1- Verbalizes Understanding      Know Your Numbers and Risk Factors: -Group verbal and written instruction about important numbers in your health.  Discussion of what are risk factors and how they play a role in the disease process.  Review of Cholesterol, Blood Pressure, Diabetes, and BMI and the role they play in your overall health.   Pulmonary Rehab from 07/29/2017 in [facility] Cardiac and Pulmonary Rehab  Date  05/27/17  Educator  [facility]  Instruction Review Code  1- Verbalizes Understanding      Sleep Hygiene: -Provides group verbal and written instruction about how sleep can affect your health.  Define sleep hygiene, discuss sleep cycles and impact of sleep habits. Review good sleep hygiene tips.    Other: -Provides group and verbal instruction on various topics (see comments)   Pulmonary Rehab from 07/29/2017 in [facility] Cardiac and Pulmonary Rehab  Date  06/10/17  Educator  [facility]  Instruction Review Code  1- Verbalizes Understanding [SLEEP]       Knowledge Questionnaire Score: Knowledge Questionnaire Score - 12/22/17 1228      Knowledge Questionnaire Score   Pre Score  16/18 reviewed with patient        Core Components/Risk Factors/Patient Goals at Admission: Personal Goals and Risk Factors at Admission - 12/22/17 1302      Core Components/Risk Factors/Patient Goals on Admission    Weight Management  Weight Maintenance;Yes    Intervention  Weight Management: Develop a combined nutrition and exercise program designed to reach desired caloric intake, while maintaining appropriate intake of nutrient and fiber, sodium and fats, and appropriate energy expenditure required for the weight goal.;Weight Management/Obesity: Establish reasonable short term and long term weight goals.;Weight Management: Provide education and appropriate resources to help  participant work on and attain dietary goals.    Admit Weight  99 lb 4.8 oz (45 kg)    Goal Weight: Long Term  100 lb (45.4 kg)    Expected Outcomes  Short Term: Continue to assess and modify interventions until short term weight is achieved;Long Term: Adherence to nutrition and physical activity/exercise program aimed toward attainment of established weight goal;Weight Maintenance: Understanding of the daily nutrition guidelines, which includes 25-35% calories from fat, 7% or less cal from saturated fats, less than 269m cholesterol, less than 1.5gm of sodium, & 5 or more servings of fruits and vegetables daily;Understanding recommendations for meals to include 15-35% energy as protein, 25-35% energy from fat, 35-60% energy from carbohydrates, less than 2059mof dietary cholesterol, 20-35 gm of total fiber daily;Understanding of distribution of calorie intake throughout the day with  the consumption of 4-5 meals/snacks    Improve shortness of breath with ADL's  Yes    Intervention  Provide education, individualized exercise plan and daily activity instruction to help decrease symptoms of SOB with activities of daily living.    Expected Outcomes  Short Term: Improve cardiorespiratory fitness to achieve a reduction of symptoms when performing ADLs;Long Term: Be able to perform more ADLs without symptoms or delay the onset of symptoms       Core Components/Risk Factors/Patient Goals Review:    Core Components/Risk Factors/Patient Goals at Discharge (Final Review):    ITP Comments: ITP Comments    Row Name 12/22/17 1217           ITP Comments  Medical evaluation completed. Chart sent for review and changes to Dr. Caryl Comes for Dr. Emily Filbert director of [place]. Diagnosis can be found in [facility] encounter 12/14/17          Comments: Initial ITP

## 2017-12-22 NOTE — Patient Instructions (Signed)
Patient Instructions  Patient Details  Name: Sherri Price MRN: [id number] Date of Birth: 10-31-1933 Referring Provider:  Lynnea Ferrier, MD  Below are your personal goals for exercise, nutrition, and risk factors. Our goal is to help you stay on track towards obtaining and maintaining these goals. We will be discussing your progress on these goals with you throughout the program.  Initial Exercise Prescription: Initial Exercise Prescription - 12/22/17 1400      Date of Initial Exercise RX and Referring Provider   Date  12/22/17    Referring Provider  Daniel Nones MD      Oxygen   Oxygen  Continuous    Liters  4-6      Treadmill   MPH  1.2    Grade  0.5    Minutes  15    METs  2      NuStep   Level  4    SPM  80    Minutes  15    METs  2      Biostep-RELP   Level  4    SPM  50    Minutes  15    METs  2      Prescription Details   Frequency (times per week)  3    Duration  Progress to 45 minutes of aerobic exercise without signs/symptoms of physical distress      Intensity   THRR 40-80% of Max Heartrate  96-125    Ratings of Perceived Exertion  11-13    Perceived Dyspnea  0-4      Progression   Progression  Continue to progress workloads to maintain intensity without signs/symptoms of physical distress.      Resistance Training   Training Prescription  Yes    Weight  2 lbs    Reps  10-15       Exercise Goals: Frequency: Be able to perform aerobic exercise two to three times per week in program working toward 2-5 days per week of home exercise.  Intensity: Work with a perceived exertion of 11 (fairly light) - 15 (hard) while following your exercise prescription.  We will make changes to your prescription with you as you progress through the program.   Duration: Be able to do 30 to 45 minutes of continuous aerobic exercise in addition to a 5 minute warm-up and a 5 minute cool-down routine.   Nutrition Goals: Your personal nutrition goals will be  established when you do your nutrition analysis with the dietician.  The following are general nutrition guidelines to follow: Cholesterol < 200mg /day Sodium < 1500mg /day Fiber: Women over 50 yrs - 21 grams per day  Personal Goals: Personal Goals and Risk Factors at Admission - 12/22/17 1302      Core Components/Risk Factors/Patient Goals on Admission    Weight Management  Weight Maintenance;Yes    Intervention  Weight Management: Develop a combined nutrition and exercise program designed to reach desired caloric intake, while maintaining appropriate intake of nutrient and fiber, sodium and fats, and appropriate energy expenditure required for the weight goal.;Weight Management/Obesity: Establish reasonable short term and long term weight goals.;Weight Management: Provide education and appropriate resources to help participant work on and attain dietary goals.    Admit Weight  99 lb 4.8 oz (45 kg)    Goal Weight: Long Term  100 lb (45.4 kg)    Expected Outcomes  Short Term: Continue to assess and modify interventions until short term weight is achieved;Long Term:  Adherence to nutrition and physical activity/exercise program aimed toward attainment of established weight goal;Weight Maintenance: Understanding of the daily nutrition guidelines, which includes 25-35% calories from fat, 7% or less cal from saturated fats, less than 200mg  cholesterol, less than 1.5gm of sodium, & 5 or more servings of fruits and vegetables daily;Understanding recommendations for meals to include 15-35% energy as protein, 25-35% energy from fat, 35-60% energy from carbohydrates, less than 200mg  of dietary cholesterol, 20-35 gm of total fiber daily;Understanding of distribution of calorie intake throughout the day with the consumption of 4-5 meals/snacks    Improve shortness of breath with ADL's  Yes    Intervention  Provide education, individualized exercise plan and daily activity instruction to help decrease symptoms of  SOB with activities of daily living.    Expected Outcomes  Short Term: Improve cardiorespiratory fitness to achieve a reduction of symptoms when performing ADLs;Long Term: Be able to perform more ADLs without symptoms or delay the onset of symptoms       Tobacco Use Initial Evaluation: Social History   Tobacco Use  Smoking Status Never Smoker  Smokeless Tobacco Never Used    Exercise Goals and Review: Exercise Goals    Row Name 12/22/17 1433             Exercise Goals   Increase Physical Activity  Yes       Intervention  Provide advice, education, support and counseling about physical activity/exercise needs.;Develop an individualized exercise prescription for aerobic and resistive training based on initial evaluation findings, risk stratification, comorbidities and participant's personal goals.       Expected Outcomes  Short Term: Attend rehab on a regular basis to increase amount of physical activity.;Long Term: Add in home exercise to make exercise part of routine and to increase amount of physical activity.;Long Term: Exercising regularly at least 3-5 days a week.       Increase Strength and Stamina  Yes       Intervention  Provide advice, education, support and counseling about physical activity/exercise needs.;Develop an individualized exercise prescription for aerobic and resistive training based on initial evaluation findings, risk stratification, comorbidities and participant's personal goals.       Expected Outcomes  Short Term: Increase workloads from initial exercise prescription for resistance, speed, and METs.;Short Term: Perform resistance training exercises routinely during rehab and add in resistance training at home;Long Term: Improve cardiorespiratory fitness, muscular endurance and strength as measured by increased METs and functional capacity (6MWT)       Able to understand and use rate of perceived exertion (RPE) scale  Yes       Intervention  Provide education and  explanation on how to use RPE scale       Expected Outcomes  Long Term:  Able to use RPE to guide intensity level when exercising independently;Short Term: Able to use RPE daily in rehab to express subjective intensity level       Able to understand and use Dyspnea scale  Yes       Intervention  Provide education and explanation on how to use Dyspnea scale       Expected Outcomes  Short Term: Able to use Dyspnea scale daily in rehab to express subjective sense of shortness of breath during exertion;Long Term: Able to use Dyspnea scale to guide intensity level when exercising independently       Knowledge and understanding of Target Heart Rate Range (THRR)  Yes       Intervention  Provide  education and explanation of THRR including how the numbers were predicted and where they are located for reference       Expected Outcomes  Short Term: Able to state/look up THRR;Long Term: Able to use THRR to govern intensity when exercising independently;Short Term: Able to use daily as guideline for intensity in rehab       Able to check pulse independently  Yes       Intervention  Provide education and demonstration on how to check pulse in carotid and radial arteries.;Review the importance of being able to check your own pulse for safety during independent exercise       Expected Outcomes  Short Term: Able to explain why pulse checking is important during independent exercise;Long Term: Able to check pulse independently and accurately       Understanding of Exercise Prescription  Yes       Intervention  Provide education, explanation, and written materials on patient's individual exercise prescription       Expected Outcomes  Short Term: Able to explain program exercise prescription;Long Term: Able to explain home exercise prescription to exercise independently          Copy of goals given to participant.

## 2017-12-25 ENCOUNTER — Encounter: Payer: Medicare Other | Admitting: *Deleted

## 2017-12-25 DIAGNOSIS — I272 Pulmonary hypertension, unspecified: Secondary | ICD-10-CM

## 2017-12-25 NOTE — Progress Notes (Signed)
Daily Session Note  Patient Details  Name: Sherri Price MRN: [id number] Date of Birth: [birth year] Referring Provider:     Pulmonary Rehab from 12/22/2017 in [facility] Cardiac and Pulmonary Rehab  Referring Provider  Ramonita Lab MD      Encounter Date: 12/25/2017  Check In:      Social History   Tobacco Use  Smoking Status Never Smoker  Smokeless Tobacco Never Used    Goals Met:  Independence with exercise equipment Exercise tolerated well Personal goals reviewed Queuing for purse lip breathing No report of cardiac concerns or symptoms Strength training completed today  Goals Unmet:  Not Applicable  Comments: First full day of exercise!  Patient was oriented to gym and equipment including functions, settings, policies, and procedures.  Patient's individual exercise prescription and treatment plan were reviewed.  All starting workloads were established based on the results of the 6 minute walk test done at initial orientation visit.  The plan for exercise progression was also introduced and progression will be customized based on patient's performance and goals.   Dr. Emily Filbert is Medical Director for [facility] [place] and [facility].

## 2017-12-28 ENCOUNTER — Encounter: Payer: Medicare Other | Admitting: *Deleted

## 2017-12-28 DIAGNOSIS — I272 Pulmonary hypertension, unspecified: Secondary | ICD-10-CM | POA: Diagnosis not present

## 2017-12-28 DIAGNOSIS — J8410 Pulmonary fibrosis, unspecified: Secondary | ICD-10-CM

## 2017-12-28 NOTE — Progress Notes (Signed)
Daily Session Note  Patient Details  Name: Sherri Price MRN: [id number] Date of Birth: 05/22/1933 Referring Provider:     Pulmonary Rehab from 12/22/2017 in [facility] Cardiac and Pulmonary Rehab  Referring Provider  Ramonita Lab MD      Encounter Date: 12/28/2017  Check In: Session Check In - 12/28/17 1029      Check-In   Supervising physician immediately available to respond to emergencies  [facility] immediately available ER MD    Physician(s)  Dr. Quentin Cornwall and Dr. Corky Downs    Location  [facility]-Cardiac & Pulmonary Rehab    Staff Present  Joellyn Rued, BS, PEC;Kathyann Spaugh [place], BS, ACSM CEP, Exercise Physiologist;Amanda Oletta Darter, [place], ACSM CEP, Exercise Physiologist    Medication changes reported      No    Fall or balance concerns reported     No    Tobacco Cessation  No Change    Warm-up and Cool-down  Performed as group-led instruction    Resistance Training Performed  Yes    VAD Patient?  No    PAD/SET Patient?  No      Pain Assessment   Currently in Pain?  No/denies    Multiple Pain Sites  No          Social History   Tobacco Use  Smoking Status Never Smoker  Smokeless Tobacco Never Used    Goals Met:  Proper associated with RPD/PD & O2 Sat Independence with exercise equipment Exercise tolerated well No report of cardiac concerns or symptoms Strength training completed today  Goals Unmet:  Not Applicable  Comments: Reviewed RPE scale, THR and program prescription with pt today.  Pt voiced understanding and was given a copy of goals to take home.    Short: Use RPE daily to regulate intensity. Long: Follow program prescription in THR.    Dr. Emily Filbert is Medical Director for [place] and [facility].

## 2017-12-30 DIAGNOSIS — J8410 Pulmonary fibrosis, unspecified: Secondary | ICD-10-CM

## 2017-12-30 DIAGNOSIS — I272 Pulmonary hypertension, unspecified: Secondary | ICD-10-CM | POA: Diagnosis not present

## 2017-12-30 NOTE — Progress Notes (Signed)
Daily Session Note  Patient Details  Name: Sherri Price MRN: [id number] Date of Birth: 04/16/1934 Referring Provider:     Pulmonary Rehab from 12/22/2017 in [facility] Cardiac and Pulmonary Rehab  Referring Provider  Klein, Bert MD      Encounter Date: 12/30/2017  Check In: Session Check In - 12/30/17 1154      Check-In   Supervising physician immediately available to respond to emergencies  [facility] immediately available ER MD    Physician(s)  Malinda and Williams    Location  [facility]-Cardiac & Pulmonary Rehab    Staff Present  Mary Jo Abernethy, RN, BSN, MA;Laureen Brown, BS, RRT, Respiratory Therapist;Jessica Hawkins, MA, RCEP, CCRP, Exercise Physiologist;Amanda Sommer, BA, ACSM CEP, Exercise Physiologist          Social History   Tobacco Use  Smoking Status Never Smoker  Smokeless Tobacco Never Used    Goals Met:  Proper associated with RPD/PD & O2 Sat Independence with exercise equipment Exercise tolerated well Strength training completed today  Goals Unmet:  Not Applicable  Comments: Pt able to follow exercise prescription today without complaint.  Will continue to monitor for progression.    Dr. Mark Miller is Medical Director for [facility] and [facility]. 

## 2018-01-01 ENCOUNTER — Encounter: Payer: Medicare Other | Admitting: *Deleted

## 2018-01-01 DIAGNOSIS — I272 Pulmonary hypertension, unspecified: Secondary | ICD-10-CM | POA: Diagnosis not present

## 2018-01-01 NOTE — Progress Notes (Signed)
Daily Session Note  Patient Details  Name: Sherri Price MRN: [id number] Date of Birth: 07/12/1933 Referring Provider:     Pulmonary Rehab from 12/22/2017 in [facility] Cardiac and Pulmonary Rehab  Referring Provider  Ramonita Lab MD      Encounter Date: 01/01/2018  Check In: Session Check In - 01/01/18 1036      Check-In   Supervising physician immediately available to respond to emergencies  [facility] immediately available ER MD    Physician(s)  Drs. Saidecki and [facility]    Staff Present  Renita Papa, RN Vickki Hearing, BA, ACSM CEP, Exercise Physiologist;Laureen Owens Shark, BS, RRT, Respiratory Therapist;Susanne Bice, RN, BSN, CCRP    Medication changes reported      No    Fall or balance concerns reported     No    Warm-up and Cool-down  Performed as group-led instruction    Resistance Training Performed  Yes    VAD Patient?  No    PAD/SET Patient?  No      Pain Assessment   Currently in Pain?  No/denies          Social History   Tobacco Use  Smoking Status Never Smoker  Smokeless Tobacco Never Used    Goals Met:  Independence with exercise equipment Exercise tolerated well No report of cardiac concerns or symptoms Strength training completed today  Goals Unmet:  Not Applicable  Comments: Pt able to follow exercise prescription today without complaint.  Will continue to monitor for progression.    Dr. Emily Filbert is Medical Director for [place] and [facility].

## 2018-01-04 DIAGNOSIS — I272 Pulmonary hypertension, unspecified: Secondary | ICD-10-CM | POA: Diagnosis not present

## 2018-01-04 DIAGNOSIS — J8410 Pulmonary fibrosis, unspecified: Secondary | ICD-10-CM

## 2018-01-04 NOTE — Progress Notes (Signed)
Daily Session Note  Patient Details  Name: Sherri Price MRN: [id number] Date of Birth: July 04, 1933 Referring Provider:     Pulmonary Rehab from 12/22/2017 in [facility] Cardiac and Pulmonary Rehab  Referring Provider  Ramonita Lab MD      Encounter Date: 01/04/2018  Check In: Session Check In - 01/04/18 1017      Check-In   Supervising physician immediately available to respond to emergencies  [facility] immediately available ER MD    Physician(s)  Dr. Corky Downs and Dr. Kerman Passey    Location  [facility]-Cardiac & Pulmonary Rehab    Staff Present  Earlean Shawl, BS, ACSM CEP, Exercise Physiologist;Joseph [facility], [place], ACSM CEP, Exercise Physiologist    Medication changes reported      No    Fall or balance concerns reported     No    Tobacco Cessation  No Change    Warm-up and Cool-down  Performed as group-led instruction    Resistance Training Performed  Yes    VAD Patient?  No    PAD/SET Patient?  No      Pain Assessment   Currently in Pain?  No/denies    Multiple Pain Sites  No          Social History   Tobacco Use  Smoking Status Never Smoker  Smokeless Tobacco Never Used    Goals Met:  Proper associated with RPD/PD & O2 Sat Independence with exercise equipment Exercise tolerated well No report of cardiac concerns or symptoms Strength training completed today  Goals Unmet:  Not Applicable  Comments: Pt able to follow exercise prescription today without complaint.  Will continue to monitor for progression.    Dr. Emily Filbert is Medical Director for [place] and [facility].

## 2018-01-06 DIAGNOSIS — J8410 Pulmonary fibrosis, unspecified: Secondary | ICD-10-CM

## 2018-01-06 DIAGNOSIS — I272 Pulmonary hypertension, unspecified: Secondary | ICD-10-CM | POA: Diagnosis not present

## 2018-01-06 NOTE — Progress Notes (Signed)
Daily Session Note  Patient Details  Name: Sherri Price MRN: [id number] Date of Birth: Mar 21, 1934 Referring Provider:     Pulmonary Rehab from 12/22/2017 in [facility] Cardiac and Pulmonary Rehab  Referring Provider  Ramonita Lab MD      Encounter Date: 01/06/2018  Check In: Session Check In - 01/06/18 1030      Check-In   Supervising physician immediately available to respond to emergencies  [facility] immediately available ER MD    Physician(s)  Jimmye Norman and Mariea Clonts    Location  [facility]-Cardiac & Pulmonary Rehab    Staff Present  Alberteen Sam, MA, RCEP, CCRP, Exercise Physiologist;Joseph [place], [place], ACSM CEP, Exercise Physiologist    Medication changes reported      No    Fall or balance concerns reported     No    Warm-up and Cool-down  Performed as group-led instruction    Resistance Training Performed  Yes    VAD Patient?  No    PAD/SET Patient?  No      Pain Assessment   Currently in Pain?  No/denies    Multiple Pain Sites  No          Social History   Tobacco Use  Smoking Status Never Smoker  Smokeless Tobacco Never Used    Goals Met:  Proper associated with RPD/PD & O2 Sat Independence with exercise equipment Exercise tolerated well Strength training completed today  Goals Unmet:  Not Applicable  Comments: Pt able to follow exercise prescription today without complaint.  Will continue to monitor for progression.    Dr. Emily Filbert is Medical Director for [place] and [facility].

## 2018-01-08 DIAGNOSIS — I272 Pulmonary hypertension, unspecified: Secondary | ICD-10-CM | POA: Diagnosis not present

## 2018-01-08 NOTE — Progress Notes (Signed)
Daily Session Note  Patient Details  Name: Sherri Price MRN: [id number] Date of Birth: [birth year] Referring Provider:     Pulmonary Rehab from 12/22/2017 in [facility] Cardiac and Pulmonary Rehab  Referring Provider  Ramonita Lab MD      Encounter Date: 01/08/2018  Check In:      Social History   Tobacco Use  Smoking Status Never Smoker  Smokeless Tobacco Never Used    Goals Met:  Independence with exercise equipment Exercise tolerated well No report of cardiac concerns or symptoms Strength training completed today  Goals Unmet:  Not Applicable  Comments: Pt able to follow exercise prescription today without complaint.  Will continue to monitor for progression.   Dr. Emily Filbert is Medical Director for [place] and [facility].

## 2018-01-11 ENCOUNTER — Encounter: Payer: Medicare Other | Admitting: *Deleted

## 2018-01-11 DIAGNOSIS — J8410 Pulmonary fibrosis, unspecified: Secondary | ICD-10-CM

## 2018-01-11 DIAGNOSIS — I272 Pulmonary hypertension, unspecified: Secondary | ICD-10-CM

## 2018-01-11 NOTE — Progress Notes (Signed)
Daily Session Note  Patient Details  Name: Sherri Price MRN: [id number] Date of Birth: [birth year] Referring Provider:     Pulmonary Rehab from 12/22/2017 in [facility]  Referring Provider  Ramonita Lab MD      Encounter Date: 01/11/2018  Check In: Session Check In - 01/11/18 1025      Check-In   Supervising physician immediately available to respond to emergencies  [facility] immediately available ER MD    Physician(s)  Dr. Clearnce Hasten and Dr. Archie Balboa    Location  [facility] & [facility]    Staff Present  Earlean Shawl, BS, ACSM CEP, Exercise Physiologist;Amanda Oletta Darter, BA, ACSM CEP, Exercise Physiologist;Joseph Tessie Fass RCP,RRT,BSRT    Medication changes reported      No    Fall or balance concerns reported     No    Tobacco Cessation  No Change    Warm-up and Cool-down  Performed as group-led instruction    Resistance Training Performed  Yes    VAD Patient?  No    PAD/SET Patient?  No      Pain Assessment   Currently in Pain?  No/denies    Multiple Pain Sites  No          Social History   Tobacco Use  Smoking Status Never Smoker  Smokeless Tobacco Never Used    Goals Met:  Proper associated with RPD/PD & O2 Sat Independence with exercise equipment Exercise tolerated well No report of cardiac concerns or symptoms Strength training completed today  Goals Unmet:  Not Applicable  Comments: Pt able to follow exercise prescription today without complaint.  Will continue to monitor for progression.    Dr. Emily Filbert is Medical Director for [place] and [facility].

## 2018-01-11 NOTE — Progress Notes (Signed)
Pulmonary Individual Treatment Plan  Patient Details  Name: Sherri Price MRN: [id number] Date of Birth: 1933/06/17 Referring Provider:     Pulmonary Rehab from 12/22/2017 in [facility] Cardiac and Pulmonary Rehab  Referring Provider  Ramonita Lab MD      Initial Encounter Date:    Pulmonary Rehab from 12/22/2017 in [facility] Cardiac and Pulmonary Rehab  Date  12/22/17      Visit Diagnosis: Pulmonary hypertension ([place])  Patient's Home Medications on Admission:  Current Outpatient Medications:  .  Ascorbic Acid (VITAMIN C) 1000 MG tablet, Take 1,000 mg by mouth 2 (two) times daily., Disp: , Rfl:  .  Cholecalciferol (VITAMIN D3) 5000 UNITS CAPS, Take 1 capsule by mouth daily., Disp: , Rfl:  .  Coenzyme Q10 100 MG capsule, Take 100 mg by mouth daily. , Disp: , Rfl:  .  Cranberry Fruit 405 MG CAPS, Take 1 capsule by mouth 2 (two) times daily. , Disp: , Rfl:  .  Evening Primrose Oil 500 MG CAPS, Take 1 capsule by mouth 2 (two) times daily. , Disp: , Rfl:  .  FLUOCINOLONE ACETONIDE SCALP 9.93 % OIL, 1 application by Other route daily as needed (psoriasis on scalp). , Disp: , Rfl:  .  L-Lysine 1000 MG TABS, Take 1 tablet by mouth 2 (two) times daily. , Disp: , Rfl:  .  levothyroxine (LEVOXYL) 100 MCG tablet, Take 100 mcg by mouth daily before breakfast. , Disp: , Rfl:  .  Multiple Vitamin (MULTIVITAMIN WITH MINERALS) TABS tablet, Take 1 tablet by mouth daily., Disp: , Rfl:  .  NON FORMULARY, Take 1 tablet by mouth 2 (two) times daily. calcium hydroxyapatite, Disp: , Rfl:  .  NON FORMULARY, Take 1 capsule by mouth 2 (two) times daily. With lunch and dinner. lactate prophase papain bromein, Disp: , Rfl:  .  NON FORMULARY, Take 1 tablet by mouth 2 (two) times daily. Quercetine, Disp: , Rfl:  .  NON FORMULARY, Take 1 Dose by mouth daily. Mixes these 3 OTC triple fiber, fiber smart, psyllium in a cup of juice every morning, Disp: , Rfl:  .  NON FORMULARY, Take 1 capsule by mouth 2 (two) times daily. Eye  health with lutein, Disp: , Rfl:  .  omeprazole (PRILOSEC) 20 MG capsule, Take 20 mg by mouth daily. , Disp: , Rfl:  .  Probiotic Product (PROBIOTIC DAILY PO), Take 2 tablets by mouth at bedtime., Disp: , Rfl:  .  Red Yeast Rice Extract 600 MG CAPS, Take 1 capsule by mouth 2 (two) times daily. , Disp: , Rfl:  .  rivaroxaban (XARELTO) 20 MG TABS tablet, Take 20 mg by mouth 2 (two) times daily at 10 AM and 5 PM., Disp: , Rfl:  .  TURMERIC CURCUMIN PO, Take 1 capsule by mouth daily. Use. One bid, Disp: , Rfl:  .  vitamin E 400 UNIT capsule, Take 400 Units by mouth 2 (two) times daily. , Disp: , Rfl:   Past Medical History: Past Medical History:  Diagnosis Date  . Arthritis   . Bell's palsy   . Cough    CHRONIC  . GERD (gastroesophageal reflux disease)   . Hypothyroidism   . IBS (irritable bowel syndrome)   . Neuropathy   . Oxygen decrease    USES AT NIGHT  . Pulmonary fibrosis ([place])   . Shortness of breath dyspnea     Tobacco Use: Social History   Tobacco Use  Smoking Status Never Smoker  Smokeless Tobacco  Never Used    Labs: Recent Review Flowsheet Data    There is no flowsheet data to display.       Pulmonary Assessment Scores: Pulmonary Assessment Scores    Row Name 12/22/17 1225         ADL UCSD   ADL Phase  Entry     SOB Score total  74     Rest  4     Walk  4     Stairs  3     Bath  4     Dress  4     Shop  4       CAT Score   CAT Score  12       mMRC Score   mMRC Score  2        Pulmonary Function Assessment: Pulmonary Function Assessment - 12/22/17 1323      Breath   Bilateral Breath Sounds  Clear    Shortness of Breath  Fear of Shortness of Breath;Panic with Shortness of Breath;Limiting activity;Yes       Exercise Target Goals: Exercise Program Goal: Individual exercise prescription set using results from initial 6 min walk test and THRR while considering  patient's activity barriers and safety.   Exercise Prescription Goal: Initial  exercise prescription builds to 30-45 minutes a day of aerobic activity, 2-3 days per week.  Home exercise guidelines will be given to patient during program as part of exercise prescription that the participant will acknowledge.  Activity Barriers & Risk Stratification: Activity Barriers & Cardiac Risk Stratification - 12/22/17 1428      Activity Barriers & Cardiac Risk Stratification   Activity Barriers  Deconditioning;Muscular Weakness;Shortness of Breath       6 Minute Walk: 6 Minute Walk    Row Name 12/22/17 1425         6 Minute Walk   Phase  Initial     Distance  688 feet     Walk Time  6 minutes     # of Rest Breaks  0     MPH  1.3     METS  1.29     RPE  12     Perceived Dyspnea   2     VO2 Peak  4.51     Symptoms  Yes (comment)     Comments  SOB     Resting HR  68 bpm     Resting BP  124/62     Resting Oxygen Saturation   92 %     Exercise Oxygen Saturation  during 6 min walk  83 %     Max Ex. HR  89 bpm     Max Ex. BP  128/70     2 Minute Post BP  122/66       Interval HR   1 Minute HR  80     2 Minute HR  82     3 Minute HR  83     4 Minute HR  82     5 Minute HR  81     6 Minute HR  89     2 Minute Post HR  69     Interval Heart Rate?  Yes       Interval Oxygen   Interval Oxygen?  Yes     Baseline Oxygen Saturation %  92 %     1 Minute Oxygen Saturation %  88 %     1   Minute Liters of Oxygen  4 L     2 Minute Oxygen Saturation %  86 % 2:26 85%     2 Minute Liters of Oxygen  4 L     3 Minute Oxygen Saturation %  85 %     3 Minute Liters of Oxygen  4 L     4 Minute Oxygen Saturation %  84 % 4:20 83%     4 Minute Liters of Oxygen  4 L     5 Minute Oxygen Saturation %  86 %     5 Minute Liters of Oxygen  4 L     6 Minute Oxygen Saturation %  85 %     6 Minute Liters of Oxygen  4 L     2 Minute Post Oxygen Saturation %  90 %     2 Minute Post Liters of Oxygen  4 L       Oxygen Initial Assessment: Oxygen Initial Assessment - 12/22/17 1302       Home Oxygen   Home Oxygen Device  Home Concentrator;E-Tanks    Sleep Oxygen Prescription  Continuous    Liters per minute  3    Home Exercise Oxygen Prescription  Continuous    Liters per minute  4    Home at Rest Exercise Oxygen Prescription  Continuous    Liters per minute  4    Compliance with Home Oxygen Use  Yes      Initial 6 min Walk   Oxygen Used  Continuous    Liters per minute  4      Program Oxygen Prescription   Program Oxygen Prescription  Continuous    Liters per minute  4      Intervention   Short Term Goals  To learn and understand importance of monitoring SPO2 with pulse oximeter and demonstrate accurate use of the pulse oximeter.;To learn and demonstrate proper pursed lip breathing techniques or other breathing techniques.;To learn and understand importance of maintaining oxygen saturations>88%;To learn and demonstrate proper use of respiratory medications;To learn and exhibit compliance with exercise, home and travel O2 prescription    Long  Term Goals  Exhibits compliance with exercise, home and travel O2 prescription;Verbalizes importance of monitoring SPO2 with pulse oximeter and return demonstration;Maintenance of O2 saturations>88%;Exhibits proper breathing techniques, such as pursed lip breathing or other method taught during program session;Compliance with respiratory medication;Demonstrates proper use of MDI's       Oxygen Re-Evaluation: Oxygen Re-Evaluation    Row Name 12/25/17 1018             Program Oxygen Prescription   Program Oxygen Prescription  Continuous       Liters per minute  4         Home Oxygen   Home Oxygen Device  Home Concentrator;E-Tanks       Sleep Oxygen Prescription  Continuous       Liters per minute  3       Home Exercise Oxygen Prescription  Continuous       Liters per minute  4       Home at Rest Exercise Oxygen Prescription  Continuous       Liters per minute  4         Goals/Expected Outcomes   Short Term Goals   To learn and understand importance of monitoring SPO2 with pulse oximeter and demonstrate accurate use of the pulse oximeter.;To learn and demonstrate proper pursed lip breathing techniques   or other breathing techniques.;To learn and understand importance of maintaining oxygen saturations>88%;To learn and demonstrate proper use of respiratory medications;To learn and exhibit compliance with exercise, home and travel O2 prescription       Long  Term Goals  Exhibits compliance with exercise, home and travel O2 prescription;Verbalizes importance of monitoring SPO2 with pulse oximeter and return demonstration;Maintenance of O2 saturations>88%;Exhibits proper breathing techniques, such as pursed lip breathing or other method taught during program session;Compliance with respiratory medication;Demonstrates proper use of MDI's       Comments  Reviewed PLB technique with pt.  Talked about how it work and it's important to maintaining his exercise saturations.         Goals/Expected Outcomes  Short: Become more profiecient at using PLB.   Long: Become independent at using PLB.          Oxygen Discharge (Final Oxygen Re-Evaluation): Oxygen Re-Evaluation - 12/25/17 1018      Program Oxygen Prescription   Program Oxygen Prescription  Continuous    Liters per minute  4      Home Oxygen   Home Oxygen Device  Home Concentrator;E-Tanks    Sleep Oxygen Prescription  Continuous    Liters per minute  3    Home Exercise Oxygen Prescription  Continuous    Liters per minute  4    Home at Rest Exercise Oxygen Prescription  Continuous    Liters per minute  4      Goals/Expected Outcomes   Short Term Goals  To learn and understand importance of monitoring SPO2 with pulse oximeter and demonstrate accurate use of the pulse oximeter.;To learn and demonstrate proper pursed lip breathing techniques or other breathing techniques.;To learn and understand importance of maintaining oxygen saturations>88%;To learn and  demonstrate proper use of respiratory medications;To learn and exhibit compliance with exercise, home and travel O2 prescription    Long  Term Goals  Exhibits compliance with exercise, home and travel O2 prescription;Verbalizes importance of monitoring SPO2 with pulse oximeter and return demonstration;Maintenance of O2 saturations>88%;Exhibits proper breathing techniques, such as pursed lip breathing or other method taught during program session;Compliance with respiratory medication;Demonstrates proper use of MDI's    Comments  Reviewed PLB technique with pt.  Talked about how it work and it's important to maintaining his exercise saturations.      Goals/Expected Outcomes  Short: Become more profiecient at using PLB.   Long: Become independent at using PLB.       Initial Exercise Prescription: Initial Exercise Prescription - 12/22/17 1400      Date of Initial Exercise RX and Referring Provider   Date  12/22/17    Referring Provider  Klein, Bert MD      Oxygen   Oxygen  Continuous    Liters  4-6      Treadmill   MPH  1.2    Grade  0.5    Minutes  15    METs  2      NuStep   Level  4    SPM  80    Minutes  15    METs  2      Biostep-RELP   Level  4    SPM  50    Minutes  15    METs  2      Prescription Details   Frequency (times per week)  3    Duration  Progress to 45 minutes of aerobic exercise without signs/symptoms of physical distress        Intensity   THRR 40-80% of Max Heartrate  96-125    Ratings of Perceived Exertion  11-13    Perceived Dyspnea  0-4      Progression   Progression  Continue to progress workloads to maintain intensity without signs/symptoms of physical distress.      Resistance Training   Training Prescription  Yes    Weight  2 lbs    Reps  10-15       Perform Capillary Blood Glucose checks as needed.  Exercise Prescription Changes: Exercise Prescription Changes    Row Name 12/22/17 1400 12/31/17 1000 01/08/18 1100         Response  to Exercise   Blood Pressure (Admit)  124/62  116/60  116/60     Blood Pressure (Exercise)  128/70  130/70  130/70     Blood Pressure (Exit)  122/66  120/66  120/66     Heart Rate (Admit)  68 bpm  83 bpm  83 bpm     Heart Rate (Exercise)  89 bpm  74 bpm  74 bpm     Heart Rate (Exit)  69 bpm  62 bpm  62 bpm     Oxygen Saturation (Admit)  92 %  89 %  89 %     Oxygen Saturation (Exercise)  83 %  89 %  89 %     Oxygen Saturation (Exit)  90 %  96 %  96 %     Rating of Perceived Exertion (Exercise)  _0 Perceived Dyspnea (Exercise)  _1 Symptoms  SOB  -  -     Comments  walk test results  4th session in pulm  4th session in pulm     Duration  -  Progress to 45 minutes of aerobic exercise without signs/symptoms of physical distress  Progress to 45 minutes of aerobic exercise without signs/symptoms of physical distress     Intensity  -  THRR unchanged  THRR unchanged       Resistance Training   Training Prescription  -  Yes  Yes     Weight  -  2 lb  2 lb     Reps  -  10-15  10-15       Oxygen   Oxygen  -  Continuous  Continuous     Liters  -  4-6  4-6       Treadmill   MPH  -  1.2  1.2     Grade  -  0.5  0.5     Minutes  -  15  15     METs  -  2  2       Arm Ergometer   Level  -  1  1     Minutes  -  15  15       Home Exercise Plan   Plans to continue exercise at  -  -  Home (comment) walks at home due to needing O2     Frequency  -  -  Add 2 additional days to program exercise sessions.     Initial Home Exercises Provided  -  -  01/08/18        Exercise Comments: Exercise Comments    Row Name 12/25/17 1016           Exercise Comments  First full day of exercise!  Patient  was oriented to gym and equipment including functions, settings, policies, and procedures.  Patient's individual exercise prescription and treatment plan were reviewed.  All starting workloads were established based on the results of the 6 minute walk test done at initial orientation  visit.  The plan for exercise progression was also introduced and progression will be customized based on patient's performance and goals.          Exercise Goals and Review: Exercise Goals    Row Name 12/22/17 1433             Exercise Goals   Increase Physical Activity  Yes       Intervention  Provide advice, education, support and counseling about physical activity/exercise needs.;Develop an individualized exercise prescription for aerobic and resistive training based on initial evaluation findings, risk stratification, comorbidities and participant's personal goals.       Expected Outcomes  Short Term: Attend rehab on a regular basis to increase amount of physical activity.;Long Term: Add in home exercise to make exercise part of routine and to increase amount of physical activity.;Long Term: Exercising regularly at least 3-5 days a week.       Increase Strength and Stamina  Yes       Intervention  Provide advice, education, support and counseling about physical activity/exercise needs.;Develop an individualized exercise prescription for aerobic and resistive training based on initial evaluation findings, risk stratification, comorbidities and participant's personal goals.       Expected Outcomes  Short Term: Increase workloads from initial exercise prescription for resistance, speed, and METs.;Short Term: Perform resistance training exercises routinely during rehab and add in resistance training at home;Long Term: Improve cardiorespiratory fitness, muscular endurance and strength as measured by increased METs and functional capacity (6MWT)       Able to understand and use rate of perceived exertion (RPE) scale  Yes       Intervention  Provide education and explanation on how to use RPE scale       Expected Outcomes  Long Term:  Able to use RPE to guide intensity level when exercising independently;Short Term: Able to use RPE daily in rehab to express subjective intensity level       Able  to understand and use Dyspnea scale  Yes       Intervention  Provide education and explanation on how to use Dyspnea scale       Expected Outcomes  Short Term: Able to use Dyspnea scale daily in rehab to express subjective sense of shortness of breath during exertion;Long Term: Able to use Dyspnea scale to guide intensity level when exercising independently       Knowledge and understanding of Target Heart Rate Range (THRR)  Yes       Intervention  Provide education and explanation of THRR including how the numbers were predicted and where they are located for reference       Expected Outcomes  Short Term: Able to state/look up THRR;Long Term: Able to use THRR to govern intensity when exercising independently;Short Term: Able to use daily as guideline for intensity in rehab       Able to check pulse independently  Yes       Intervention  Provide education and demonstration on how to check pulse in carotid and radial arteries.;Review the importance of being able to check your own pulse for safety during independent exercise       Expected Outcomes  Short Term: Able to explain why pulse checking is   important during independent exercise;Long Term: Able to check pulse independently and accurately       Understanding of Exercise Prescription  Yes       Intervention  Provide education, explanation, and written materials on patient's individual exercise prescription       Expected Outcomes  Short Term: Able to explain program exercise prescription;Long Term: Able to explain home exercise prescription to exercise independently          Exercise Goals Re-Evaluation : Exercise Goals Re-Evaluation    Row Name 12/25/17 1017 12/31/17 1044 01/08/18 1139         Exercise Goal Re-Evaluation   Exercise Goals Review  Able to understand and use Dyspnea scale;Knowledge and understanding of Target Heart Rate Range (THRR);Understanding of Exercise Prescription;Able to understand and use rate of perceived exertion  (RPE) scale  Increase Physical Activity;Increase Strength and Stamina;Able to understand and use rate of perceived exertion (RPE) scale;Able to understand and use Dyspnea scale  Increase Physical Activity;Increase Strength and Stamina;Able to understand and use rate of perceived exertion (RPE) scale;Able to understand and use Dyspnea scale;Knowledge and understanding of Target Heart Rate Range (THRR);Able to check pulse independently;Understanding of Exercise Prescription     Comments  Reviewed RPE scale, THR and program prescription with pt today.  Pt voiced understanding and was given a copy of goals to take home.   Angelyne has transitioned from CARE to LW.  Staff are working with her to have her try different equipment to improve her fitness.  She did not like the Biostep and so tried Arm Crank.  Staff will monitor progress.  Reviewed home exercise with pt today.  Pt plans to walk for exercise.  Reviewed THR, pulse, RPE, sign and symptoms, NTG use, and when to call 911 or MD.  Also discussed weather considerations and indoor options.  Pt voiced understanding.     Expected Outcomes  Short: Use RPE daily to regulate intensity. Long: Follow program prescription in THR.  Short - continue to attend and try different machines Long - maintain or improve current MET level  Short - Sabriyah will continue to walk at home on days not at class Long - Gaynor will maintain current fitness level        Discharge Exercise Prescription (Final Exercise Prescription Changes): Exercise Prescription Changes - 01/08/18 1100      Response to Exercise   Blood Pressure (Admit)  116/60    Blood Pressure (Exercise)  130/70    Blood Pressure (Exit)  120/66    Heart Rate (Admit)  83 bpm    Heart Rate (Exercise)  74 bpm    Heart Rate (Exit)  62 bpm    Oxygen Saturation (Admit)  89 %    Oxygen Saturation (Exercise)  89 %    Oxygen Saturation (Exit)  96 %    Rating of Perceived Exertion (Exercise)  13    Perceived Dyspnea  (Exercise)  2    Comments  4th session in pulm    Duration  Progress to 45 minutes of aerobic exercise without signs/symptoms of physical distress    Intensity  THRR unchanged      Resistance Training   Training Prescription  Yes    Weight  2 lb    Reps  10-15      Oxygen   Oxygen  Continuous    Liters  4-6      Treadmill   MPH  1.2    Grade  0.5      Minutes  15    METs  2      Arm Ergometer   Level  1    Minutes  15      Home Exercise Plan   Plans to continue exercise at  Home (comment)   walks at home due to needing O2   Frequency  Add 2 additional days to program exercise sessions.    Initial Home Exercises Provided  01/08/18       Nutrition:  Target Goals: Understanding of nutrition guidelines, daily intake of sodium <1500mg, cholesterol <200mg, calories 30% from fat and 7% or less from saturated fats, daily to have 5 or more servings of fruits and vegetables.  Biometrics: Pre Biometrics - 12/22/17 1434      Pre Biometrics   Height  4' 11.2" (1.504 m)    Weight  99 lb 4.8 oz (45 kg)    Waist Circumference  27 inches    Hip Circumference  35.5 inches    Waist to Hip Ratio  0.76 %    BMI (Calculated)  19.91    Single Leg Stand  2.43 seconds        Nutrition Therapy Plan and Nutrition Goals: Nutrition Therapy & Goals - 12/28/17 1037      Nutrition Therapy   Diet  TLC    Protein (specify units)  6oz    Fiber  20 grams    Whole Grain Foods  3 servings    Saturated Fats  12 max. grams    Fruits and Vegetables  5 servings/day   8 ideal, eats small portions   Sodium  1500 grams      Personal Nutrition Goals   Nutrition Goal  Drink Boost nutritional shakes at least once per day to help meet calorie and protein needs. You can also look for the Ensure high protein variety    Personal Goal #2  Be consistent about consuming an evening snack, along with regular meals/ snacks throughout the day. Eating on a consistent schedule will help prevent wt loss     Personal Goal #3  Continue to include sources of calorie-dense foods such as fats in your diet regularly. Since you like nuts, these would make a great snack    Comments  She tries to eat as much as she can at meal times but still struggles to meet daily energy needs. She has been eating dried fruit, nuts, and some protein shakes/ snacks      Intervention Plan   Intervention  Prescribe, educate and counsel regarding individualized specific dietary modifications aiming towards targeted core components such as weight, hypertension, lipid management, diabetes, heart failure and other comorbidities.    Expected Outcomes  Short Term Goal: Understand basic principles of dietary content, such as calories, fat, sodium, cholesterol and nutrients.;Short Term Goal: A plan has been developed with personal nutrition goals set during dietitian appointment.;Long Term Goal: Adherence to prescribed nutrition plan.       Nutrition Assessments: Nutrition Assessments - 12/22/17 1229      MEDFICTS Scores   Pre Score  13       Nutrition Goals Re-Evaluation: Nutrition Goals Re-Evaluation    Row Name 12/28/17 1054             Goals   Nutrition Goal  Be consistent about consuming an evening snack, along with regular meals/ snacks throughout the day. Eating on a consistent schedule will help prevent wt loss       Comment  She c/o   struggling to eat enough at meal times despite trying to eat as much as she can       Expected Outcome  She will eat smaller, more frequent meals in an attempt to meet daily nutritional needs. Emphasize protein and fats         Personal Goal #2 Re-Evaluation   Personal Goal #2  Drink Boost nutritional shakes at least once per day to help meet calorie and protein needs. You can also look for the Ensure high protein variety         Personal Goal #3 Re-Evaluation   Personal Goal #3  Continue to include sources of calorie-dense foods such as fats in your diet regularly. Since you like  nuts, these would make a great snack          Nutrition Goals Discharge (Final Nutrition Goals Re-Evaluation): Nutrition Goals Re-Evaluation - 12/28/17 1054      Goals   Nutrition Goal  Be consistent about consuming an evening snack, along with regular meals/ snacks throughout the day. Eating on a consistent schedule will help prevent wt loss    Comment  She c/o struggling to eat enough at meal times despite trying to eat as much as she can    Expected Outcome  She will eat smaller, more frequent meals in an attempt to meet daily nutritional needs. Emphasize protein and fats      Personal Goal #2 Re-Evaluation   Personal Goal #2  Drink Boost nutritional shakes at least once per day to help meet calorie and protein needs. You can also look for the Ensure high protein variety      Personal Goal #3 Re-Evaluation   Personal Goal #3  Continue to include sources of calorie-dense foods such as fats in your diet regularly. Since you like nuts, these would make a great snack       Psychosocial: Target Goals: Acknowledge presence or absence of significant depression and/or stress, maximize coping skills, provide positive support system. Participant is able to verbalize types and ability to use techniques and skills needed for reducing stress and depression.   Initial Review & Psychosocial Screening: Initial Psych Review & Screening - 12/22/17 1304      Initial Review   Current issues with  Current Stress Concerns    Source of Stress Concerns  Chronic Illness    Comments  Patient requires more oxygen than the last time she was in [facility].      Family Dynamics   Good Support System?  Yes    Comments  Her husband is a good support system. Most of her family is in [place].      Barriers   Psychosocial barriers to participate in program  The patient should benefit from training in stress management and relaxation.      Screening Interventions   Interventions  Encouraged to  exercise;Program counselor consult;Provide feedback about the scores to participant;To provide support and resources with identified psychosocial needs    Expected Outcomes  Short Term goal: Identification and review with participant of any Quality of Life or Depression concerns found by scoring the questionnaire.;Long Term goal: The participant improves quality of Life and PHQ9 Scores as seen by post scores and/or verbalization of changes;Long Term Goal: Stressors or current issues are controlled or eliminated.;Short Term goal: Utilizing psychosocial counselor, staff and physician to assist with identification of specific Stressors or current issues interfering with healing process. Setting desired goal for each stressor or current issue identified.         Quality of Life Scores:  Scores of 19 and below usually indicate a poorer quality of life in these areas.  A difference of  2-3 points is a clinically meaningful difference.  A difference of 2-3 points in the total score of the Quality of Life Index has been associated with significant improvement in overall quality of life, self-image, physical symptoms, and general health in studies assessing change in quality of life.  PHQ-9: Recent Review Flowsheet Data    Depression screen [facility] 2/9 12/22/2017 07/22/2017 06/24/2017 05/05/2017 04/30/2017   Decreased Interest _0 0   Down, Depressed, Hopeless _1 0 0   PHQ - 2 Score _2 0   Altered sleeping _3 -   Tired, decreased energy _4 -   Change in appetite 1 1 0 0 -   Feeling bad or failure about yourself  0 0 0 0 -   Trouble concentrating 0 0 0 0 -   Moving slowly or fidgety/restless 1 0 0 0 -   Suicidal thoughts 0 0 0 0 -   PHQ-9 Score _5 -   Difficult doing work/chores Somewhat difficult - Somewhat difficult Very difficult -     Interpretation of Total Score  Total Score Depression Severity:  1-4 = Minimal depression, 5-9 = Mild depression, 10-14 = Moderate depression,  15-19 = Moderately severe depression, 20-27 = Severe depression   Psychosocial Evaluation and Intervention: Psychosocial Evaluation - 12/28/17 1113      Psychosocial Evaluation & Interventions   Interventions  Relaxation education    Comments  Counselor met with Ms. Brigitte Pulse [facility] for a new intake psychosocial evaluation.  She has returned after several months of being out of the program and completed the CARE program in the interim.  She continues to have a strong support system and no new health diagnoses.  She reports sleeping well and takes natural supplements to help with this.  She states she's a little "dumpy" on occasion meaning sometimes a little "down in the dumps" with her mood but reports she is on medication to help with this, although counselor did not see anything in her medication list for mood.  Marilin states her health is the most stressful thing for her and having to be on oxygen 24/7.  She has goals to improve her breathing and stamina while in this program.      Expected Outcomes  Short:  Rubby will exercise consistently and attend the educational components to learn positive ways to manage her condition.   Long:  Kalaya will develop relaxation skills to help reduce the amount of oxygen needed.  She will continue to exercise and practice positive self-care.     Continue Psychosocial Services   Follow up required by staff       Psychosocial Re-Evaluation:   Psychosocial Discharge (Final Psychosocial Re-Evaluation):   Education: Education Goals: Education classes will be provided on a weekly basis, covering required topics. Participant will state understanding/return demonstration of topics presented.  Learning Barriers/Preferences: Learning Barriers/Preferences - 12/22/17 1228      Learning Barriers/Preferences   Learning Barriers  Sight   wears glasses   Learning Preferences  None       Education Topics:  Initial Evaluation Education: - Verbal, written and  demonstration of respiratory meds, oximetry and breathing techniques. Instruction on use of nebulizers and MDIs and importance of monitoring MDI activations.   Pulmonary Rehab from 01/06/2018  in [facility] Cardiac and Pulmonary Rehab  Date  12/22/17  Educator  [facility]  Instruction Review Code  1- Verbalizes Understanding      General Nutrition Guidelines/Fats and Fiber: -Group instruction provided by verbal, written material, models and posters to present the general guidelines for heart healthy nutrition. Gives an explanation and review of dietary fats and fiber.   Pulmonary Rehab from 07/29/2017 in [facility] Cardiac and Pulmonary Rehab  Date  06/15/17  Educator  CR  Instruction Review Code  1- Verbalizes Understanding      Controlling Sodium/Reading Food Labels: -Group verbal and written material supporting the discussion of sodium use in heart healthy nutrition. Review and explanation with models, verbal and written materials for utilization of the food label.   Pulmonary Rehab from 07/29/2017 in [facility] Cardiac and Pulmonary Rehab  Date  06/22/17  Educator  PI  Instruction Review Code  1- Verbalizes Understanding      Exercise Physiology & General Exercise Guidelines: - Group verbal and written instruction with models to review the exercise physiology of the cardiovascular system and associated critical values. Provides general exercise guidelines with specific guidelines to those with heart or lung disease.    Pulmonary Rehab from 07/29/2017 in [facility] Cardiac and Pulmonary Rehab  Date  05/22/17  Educator  [facility]  Instruction Review Code  1- Verbalizes Understanding      Aerobic Exercise & Resistance Training: - Gives group verbal and written instruction on the various components of exercise. Focuses on aerobic and resistive training programs and the benefits of this training and how to safely progress through these programs.   Pulmonary Rehab from 07/29/2017 in [facility] Cardiac and Pulmonary Rehab  Date   06/05/17  Educator  AS  Instruction Review Code  1- Verbalizes Understanding      Flexibility, Balance, Mind/Body Relaxation: Provides group verbal/written instruction on the benefits of flexibility and balance training, including mind/body exercise modes such as yoga, pilates and tai chi.  Demonstration and skill practice provided.   Pulmonary Rehab from 07/29/2017 in [facility] Cardiac and Pulmonary Rehab  Date  07/01/17  Educator  AS  Instruction Review Code  1- Verbalizes Understanding      Stress and Anxiety: - Provides group verbal and written instruction about the health risks of elevated stress and causes of high stress.  Discuss the correlation between heart/lung disease and anxiety and treatment options. Review healthy ways to manage with stress and anxiety.   Pulmonary Rehab from 07/29/2017 in [facility] Cardiac and Pulmonary Rehab  Date  07/22/17  Educator  [facility]  Instruction Review Code  1- Verbalizes Understanding      Depression: - Provides group verbal and written instruction on the correlation between heart/lung disease and depressed mood, treatment options, and the stigmas associated with seeking treatment.   Pulmonary Rehab from 07/29/2017 in [facility] Cardiac and Pulmonary Rehab  Date  07/08/17  Educator  [facility]  Instruction Review Code  1- Verbalizes Understanding      Exercise & Equipment Safety: - Individual verbal instruction and demonstration of equipment use and safety with use of the equipment.   Pulmonary Rehab from 01/06/2018 in [facility] Cardiac and Pulmonary Rehab  Date  12/22/17  Educator  [facility]  Instruction Review Code  1- Verbalizes Understanding      Infection Prevention: - Provides verbal and written material to individual with discussion of infection control including proper hand washing and proper equipment cleaning during exercise session.   Pulmonary Rehab from 01/06/2018 in [facility] Cardiac and Pulmonary  Rehab  Date  12/22/17  Educator  [facility]  Instruction Review Code  1-  Verbalizes Understanding      Falls Prevention: - Provides verbal and written material to individual with discussion of falls prevention and safety.   Pulmonary Rehab from 01/06/2018 in [facility] Cardiac and Pulmonary Rehab  Date  12/22/17  Educator  [facility]  Instruction Review Code  1- Verbalizes Understanding      Diabetes: - Individual verbal and written instruction to review signs/symptoms of diabetes, desired ranges of glucose level fasting, after meals and with exercise. Advice that pre and post exercise glucose checks will be done for 3 sessions at entry of program.   Chronic Lung Diseases: - Group verbal and written instruction to review updates, respiratory medications, advancements in procedures and treatments. Discuss use of supplemental oxygen including available portable oxygen systems, continuous and intermittent flow rates, concentrators, personal use and safety guidelines. Review proper use of inhaler and spacers. Provide informative websites for self-education.    Pulmonary Rehab from 07/29/2017 in [facility] Cardiac and Pulmonary Rehab  Date  05/20/17  Educator  [facility]  Instruction Review Code  1- Verbalizes Understanding      Energy Conservation: - Provide group verbal and written instruction for methods to conserve energy, plan and organize activities. Instruct on pacing techniques, use of adaptive equipment and posture/positioning to relieve shortness of breath.   Pulmonary Rehab from 01/06/2018 in [facility] Cardiac and Pulmonary Rehab  Date  01/06/18  Educator  [facility]  Instruction Review Code  1- Verbalizes Understanding      Triggers and Exacerbations: - Group verbal and written instruction to review types of environmental triggers and ways to prevent exacerbations. Discuss weather changes, air quality and the benefits of nasal washing. Review warning signs and symptoms to help prevent infections. Discuss techniques for effective airway clearance, coughing, and vibrations.   Pulmonary  Rehab from 07/29/2017 in [facility] Cardiac and Pulmonary Rehab  Date  06/03/17  Educator  [facility]  Instruction Review Code  1- Verbalizes Understanding      AED/CPR: - Group verbal and written instruction with the use of models to demonstrate the basic use of the AED with the basic ABC's of resuscitation.   Pulmonary Rehab from 07/29/2017 in [facility] Cardiac and Pulmonary Rehab  Date  07/17/17  Educator  [facility]  Instruction Review Code  1- Actuary and Physiology of the Lungs: - Group verbal and written instruction with the use of models to provide basic lung anatomy and physiology related to function, structure and complications of lung disease.   Pulmonary Rehab from 07/29/2017 in [facility] Cardiac and Pulmonary Rehab  Date  07/29/17  Educator  [facility]  Instruction Review Code  1- Verbalizes Understanding      Anatomy & Physiology of the Heart: - Group verbal and written instruction and models provide basic cardiac anatomy and physiology, with the coronary electrical and arterial systems. Review of Valvular disease and Heart Failure   Pulmonary Rehab from 07/29/2017 in [facility] Cardiac and Pulmonary Rehab  Date  06/17/17  Educator  [facility]  Instruction Review Code  1- Verbalizes Understanding      Cardiac Medications: - Group verbal and written instruction to review commonly prescribed medications for heart disease. Reviews the medication, class of the drug, and side effects.   Pulmonary Rehab from 01/06/2018 in [facility] Cardiac and Pulmonary Rehab  Date  12/30/17  Educator  [facility]  Instruction Review Code  1- Verbalizes Understanding  Know Your Numbers and Risk Factors: -Group verbal and written instruction about important numbers in your health.  Discussion of what are risk factors and how they play a role in the disease process.  Review of Cholesterol, Blood Pressure, Diabetes, and BMI and the role they play in your overall health.   Pulmonary Rehab from 07/29/2017 in [facility] Cardiac and  Pulmonary Rehab  Date  05/27/17  Educator  [facility]  Instruction Review Code  1- Verbalizes Understanding      Sleep Hygiene: -Provides group verbal and written instruction about how sleep can affect your health.  Define sleep hygiene, discuss sleep cycles and impact of sleep habits. Review good sleep hygiene tips.    Other: -Provides group and verbal instruction on various topics (see comments)   Pulmonary Rehab from 07/29/2017 in [facility] Cardiac and Pulmonary Rehab  Date  06/10/17  Educator  [facility]  Instruction Review Code  1- Verbalizes Understanding [SLEEP]       Knowledge Questionnaire Score: Knowledge Questionnaire Score - 12/22/17 1228      Knowledge Questionnaire Score   Pre Score  16/18   reviewed with patient       Core Components/Risk Factors/Patient Goals at Admission: Personal Goals and Risk Factors at Admission - 12/22/17 1302      Core Components/Risk Factors/Patient Goals on Admission    Weight Management  Weight Maintenance;Yes    Intervention  Weight Management: Develop a combined nutrition and exercise program designed to reach desired caloric intake, while maintaining appropriate intake of nutrient and fiber, sodium and fats, and appropriate energy expenditure required for the weight goal.;Weight Management/Obesity: Establish reasonable short term and long term weight goals.;Weight Management: Provide education and appropriate resources to help participant work on and attain dietary goals.    Admit Weight  99 lb 4.8 oz (45 kg)    Goal Weight: Long Term  100 lb (45.4 kg)    Expected Outcomes  Short Term: Continue to assess and modify interventions until short term weight is achieved;Long Term: Adherence to nutrition and physical activity/exercise program aimed toward attainment of established weight goal;Weight Maintenance: Understanding of the daily nutrition guidelines, which includes 25-35% calories from fat, 7% or less cal from saturated fats, less than 228m  cholesterol, less than 1.5gm of sodium, & 5 or more servings of fruits and vegetables daily;Understanding recommendations for meals to include 15-35% energy as protein, 25-35% energy from fat, 35-60% energy from carbohydrates, less than 2044mof dietary cholesterol, 20-35 gm of total fiber daily;Understanding of distribution of calorie intake throughout the day with the consumption of 4-5 meals/snacks    Improve shortness of breath with ADL's  Yes    Intervention  Provide education, individualized exercise plan and daily activity instruction to help decrease symptoms of SOB with activities of daily living.    Expected Outcomes  Short Term: Improve cardiorespiratory fitness to achieve a reduction of symptoms when performing ADLs;Long Term: Be able to perform more ADLs without symptoms or delay the onset of symptoms       Core Components/Risk Factors/Patient Goals Review:    Core Components/Risk Factors/Patient Goals at Discharge (Final Review):    ITP Comments: ITP Comments    Row Name 12/22/17 1217           ITP Comments  Medical evaluation completed. Chart sent for review and changes to Dr. KlCaryl Comesor Dr. MaEmily Filbertirector of [place]Diagnosis can be found [facility]          Comments:  30 day review

## 2018-01-15 DIAGNOSIS — I272 Pulmonary hypertension, unspecified: Secondary | ICD-10-CM | POA: Diagnosis not present

## 2018-01-15 NOTE — Progress Notes (Signed)
Daily Session Note  Patient Details  Name: Sherri Price MRN: [id number] Date of Birth: Apr 25, 1934 Referring Provider:     Pulmonary Rehab from 12/22/2017 in [facility] Cardiac and Pulmonary Rehab  Referring Provider  Ramonita Lab MD      Encounter Date: 01/15/2018  Check In: Session Check In - 01/15/18 1012      Check-In   Supervising physician immediately available to respond to emergencies  [facility] immediately available ER MD    Physician(s)  Dr. Alfred Levins and [facility]    Location  [facility]-Cardiac & Pulmonary Rehab    Staff Present  Justin Mend RCP,RRT,BSRT;Mary Kellie Shropshire, RN, BSN, Kela Millin, BA, ACSM CEP, Exercise Physiologist    Medication changes reported      No    Fall or balance concerns reported     No    Warm-up and Cool-down  Performed as group-led instruction    Resistance Training Performed  Yes    VAD Patient?  No      Pain Assessment   Currently in Pain?  No/denies          Social History   Tobacco Use  Smoking Status Never Smoker  Smokeless Tobacco Never Used    Goals Met:  Independence with exercise equipment Exercise tolerated well No report of cardiac concerns or symptoms Strength training completed today  Goals Unmet:  Not Applicable  Comments: Pt able to follow exercise prescription today without complaint.  Will continue to monitor for progression.   Dr. Emily Filbert is Medical Director for [place] and [facility].

## 2018-01-20 ENCOUNTER — Encounter: Payer: Medicare Other | Attending: Internal Medicine

## 2018-01-20 DIAGNOSIS — E039 Hypothyroidism, unspecified: Secondary | ICD-10-CM | POA: Insufficient documentation

## 2018-01-20 DIAGNOSIS — K219 Gastro-esophageal reflux disease without esophagitis: Secondary | ICD-10-CM | POA: Insufficient documentation

## 2018-01-20 DIAGNOSIS — Z79899 Other long term (current) drug therapy: Secondary | ICD-10-CM | POA: Diagnosis not present

## 2018-01-20 DIAGNOSIS — R0602 Shortness of breath: Secondary | ICD-10-CM | POA: Diagnosis not present

## 2018-01-20 DIAGNOSIS — Z79890 Hormone replacement therapy (postmenopausal): Secondary | ICD-10-CM | POA: Diagnosis not present

## 2018-01-20 DIAGNOSIS — I272 Pulmonary hypertension, unspecified: Secondary | ICD-10-CM | POA: Diagnosis not present

## 2018-01-20 DIAGNOSIS — J8410 Pulmonary fibrosis, unspecified: Secondary | ICD-10-CM | POA: Diagnosis not present

## 2018-01-20 NOTE — Progress Notes (Signed)
Daily Session Note  Patient Details  Name: LAVEYAH ORIOL MRN: [id number] Date of Birth: Feb 29, 1934 Referring Provider:     Pulmonary Rehab from 12/22/2017 in [facility] Cardiac and Pulmonary Rehab  Referring Provider  Ramonita Lab MD      Encounter Date: 01/20/2018  Check In: Session Check In - 01/20/18 1016      Check-In   Supervising physician immediately available to respond to emergencies  [facility] immediately available ER MD    Physician(s)  Alfred Levins and [place]    Location  [facility]-Cardiac & Pulmonary Rehab    Staff Present  Alberteen Sam, MA, RCEP, CCRP, Exercise Physiologist;Joseph Foy [place], [place], ACSM CEP, Exercise Physiologist    Medication changes reported      No    Fall or balance concerns reported     No    Warm-up and Cool-down  Performed as group-led instruction    Resistance Training Performed  Yes    VAD Patient?  No    PAD/SET Patient?  No      Pain Assessment   Currently in Pain?  No/denies    Multiple Pain Sites  No          Social History   Tobacco Use  Smoking Status Never Smoker  Smokeless Tobacco Never Used    Goals Met:  Proper associated with RPD/PD & O2 Sat Independence with exercise equipment Exercise tolerated well Strength training completed today  Goals Unmet:  Not Applicable  Comments: Pt able to follow exercise prescription today without complaint.  Will continue to monitor for progression.    Dr. Emily Filbert is Medical Director for [place] and [facility].

## 2018-01-22 ENCOUNTER — Encounter: Payer: Medicare Other | Admitting: *Deleted

## 2018-01-22 DIAGNOSIS — I272 Pulmonary hypertension, unspecified: Secondary | ICD-10-CM

## 2018-01-22 DIAGNOSIS — J8410 Pulmonary fibrosis, unspecified: Secondary | ICD-10-CM

## 2018-01-22 NOTE — Progress Notes (Signed)
Daily Session Note  Patient Details  Name: Sherri Price MRN: [id number] Date of Birth: 08-09-1933 Referring Provider:     Pulmonary Rehab from 12/22/2017 in [facility] Cardiac and Pulmonary Rehab  Referring Provider  Ramonita Lab MD      Encounter Date: 01/22/2018  Check In: Session Check In - 01/22/18 1014      Check-In   Supervising physician immediately available to respond to emergencies  [facility] immediately available ER MD    Physician(s)  Dr. Corky Downs and [facility]    Location  [facility]-Cardiac & Pulmonary Rehab    Staff Present  Renita Papa, RN BSN;Jessica Luan Pulling, MA, RCEP, CCRP, Exercise Physiologist;Joseph Tessie Fass RCP,RRT,BSRT    Medication changes reported      No    Fall or balance concerns reported     No    Tobacco Cessation  No Change    Warm-up and Cool-down  Performed as group-led instruction    Resistance Training Performed  Yes    VAD Patient?  No    PAD/SET Patient?  No      Pain Assessment   Currently in Pain?  No/denies          Social History   Tobacco Use  Smoking Status Never Smoker  Smokeless Tobacco Never Used    Goals Met:  Proper associated with RPD/PD & O2 Sat Independence with exercise equipment Using PLB without cueing & demonstrates good technique Exercise tolerated well Personal goals reviewed No report of cardiac concerns or symptoms Strength training completed today  Goals Unmet:  Not Applicable  Comments: Pt able to follow exercise prescription today without complaint.  Will continue to monitor for progression.    Dr. Emily Filbert is Medical Director for [place] and [facility].

## 2018-01-25 ENCOUNTER — Encounter: Payer: Medicare Other | Admitting: *Deleted

## 2018-01-25 DIAGNOSIS — I272 Pulmonary hypertension, unspecified: Secondary | ICD-10-CM

## 2018-01-25 DIAGNOSIS — J8410 Pulmonary fibrosis, unspecified: Secondary | ICD-10-CM

## 2018-01-25 NOTE — Progress Notes (Signed)
Daily Session Note  Patient Details  Name: Sherri Price MRN: [id number] Date of Birth: July 15, 1933 Referring Provider:     Pulmonary Rehab from 12/22/2017 in [facility] Cardiac and Pulmonary Rehab  Referring Provider  Ramonita Lab MD      Encounter Date: 01/25/2018  Check In: Session Check In - 01/25/18 1053      Check-In   Supervising physician immediately available to respond to emergencies  [facility] immediately available ER MD    Physician(s)  Dr. Kerman Passey and Dr. Joni Fears    Location  [facility]-Cardiac & Pulmonary Rehab    Staff Present  Earlean Shawl, BS, ACSM CEP, Exercise Physiologist;Joseph [facility], [place], ACSM CEP, Exercise Physiologist    Medication changes reported      No    Fall or balance concerns reported     No    Tobacco Cessation  No Change    Warm-up and Cool-down  Performed as group-led instruction    Resistance Training Performed  Yes    VAD Patient?  No    PAD/SET Patient?  No      Pain Assessment   Currently in Pain?  No/denies    Multiple Pain Sites  No          Social History   Tobacco Use  Smoking Status Never Smoker  Smokeless Tobacco Never Used    Goals Met:  Proper associated with RPD/PD & O2 Sat Independence with exercise equipment Exercise tolerated well No report of cardiac concerns or symptoms Strength training completed today  Goals Unmet:  Not Applicable  Comments: Pt able to follow exercise prescription today without complaint.  Will continue to monitor for progression.    Dr. Emily Filbert is Medical Director for [place] and [facility].

## 2018-01-27 ENCOUNTER — Encounter: Payer: Medicare Other | Admitting: *Deleted

## 2018-01-27 DIAGNOSIS — I272 Pulmonary hypertension, unspecified: Secondary | ICD-10-CM

## 2018-01-27 NOTE — Progress Notes (Signed)
Daily Session Note  Patient Details  Name: Sherri Price MRN: [id number] Date of Birth: [birth year] Referring Provider:     Pulmonary Rehab from 12/22/2017 in [facility] Cardiac and Pulmonary Rehab  Referring Provider  Ramonita Lab MD      Encounter Date: 01/27/2018  Check In: Session Check In - 01/27/18 1127      Check-In   Supervising physician immediately available to respond to emergencies  [facility] immediately available ER MD    Physician(s)  Drs. Alyse Low    Location  [facility]-Cardiac & Pulmonary Rehab    Staff Present  Alberteen Sam, MA, RCEP, CCRP, Exercise Physiologist;Joseph [place];Heath Lark, RN, BSN, CCRP    Medication changes reported      No    Fall or balance concerns reported     No    Warm-up and Cool-down  Performed as group-led instruction    Resistance Training Performed  Yes    VAD Patient?  No    PAD/SET Patient?  No      Pain Assessment   Currently in Pain?  No/denies          Social History   Tobacco Use  Smoking Status Never Smoker  Smokeless Tobacco Never Used    Goals Met:  Proper associated with RPD/PD & O2 Sat Independence with exercise equipment Using PLB without cueing & demonstrates good technique Exercise tolerated well No report of cardiac concerns or symptoms Strength training completed today  Goals Unmet:  Not Applicable  Comments: Pt able to follow exercise prescription today without complaint.  Will continue to monitor for progression.    Dr. Emily Filbert is Medical Director for [place] and [facility].

## 2018-01-29 DIAGNOSIS — J8410 Pulmonary fibrosis, unspecified: Secondary | ICD-10-CM

## 2018-01-29 DIAGNOSIS — I272 Pulmonary hypertension, unspecified: Secondary | ICD-10-CM | POA: Diagnosis not present

## 2018-01-29 NOTE — Progress Notes (Signed)
Daily Session Note  Patient Details  Name: Sherri Price MRN: [id number] Date of Birth: 1933/06/30 Referring Provider:     Pulmonary Rehab from 12/22/2017 in [facility] Cardiac and Pulmonary Rehab  Referring Provider  Ramonita Lab MD      Encounter Date: 01/29/2018  Check In: Session Check In - 01/29/18 1013      Check-In   Supervising physician immediately available to respond to emergencies  [facility] immediately available ER MD    Physician(s)  Dr. Kerman Passey and [facility]    Location  [facility]-Cardiac & Pulmonary Rehab    Staff Present  Renita Papa, RN BSN;Laine Giovanetti RCP,RRT,BSRT;Amanda Oletta Darter, [place], ACSM CEP, Exercise Physiologist    Medication changes reported      No    Fall or balance concerns reported     No    Warm-up and Cool-down  Performed as group-led instruction    Resistance Training Performed  Yes    VAD Patient?  No    PAD/SET Patient?  No      Pain Assessment   Currently in Pain?  No/denies          Social History   Tobacco Use  Smoking Status Never Smoker  Smokeless Tobacco Never Used    Goals Met:  Proper associated with RPD/PD & O2 Sat Independence with exercise equipment Using PLB without cueing & demonstrates good technique Exercise tolerated well No report of cardiac concerns or symptoms Strength training completed today  Goals Unmet:  Not Applicable  Comments: Pt able to follow exercise prescription today without complaint.  Will continue to monitor for progression.    Dr. Emily Filbert is Medical Director for [place] and [facility].

## 2018-02-01 ENCOUNTER — Encounter: Payer: Medicare Other | Admitting: *Deleted

## 2018-02-01 DIAGNOSIS — I272 Pulmonary hypertension, unspecified: Secondary | ICD-10-CM | POA: Diagnosis not present

## 2018-02-01 DIAGNOSIS — J8410 Pulmonary fibrosis, unspecified: Secondary | ICD-10-CM

## 2018-02-01 NOTE — Progress Notes (Signed)
Daily Session Note  Patient Details  Name: Sherri Price MRN: [id number] Date of Birth: Jul 16, 1933 Referring Provider:     Pulmonary Rehab from 12/22/2017 in [facility] Cardiac and Pulmonary Rehab  Referring Provider  Ramonita Lab MD      Encounter Date: 02/01/2018  Check In: Session Check In - 02/01/18 1026      Check-In   Supervising physician immediately available to respond to emergencies  [facility] immediately available ER MD    Physician(s)  Dr. Mariea Clonts and Dr. Corky Downs    Location  [facility]-Cardiac & Pulmonary Rehab    Staff Present  Justin Mend RCP,RRT,BSRT;Amanda Oletta Darter, BA, ACSM CEP, Exercise Physiologist;Fahd Galea Amedeo Plenty, BS, ACSM CEP, Exercise Physiologist    Medication changes reported      No    Fall or balance concerns reported     No    Tobacco Cessation  No Change    Warm-up and Cool-down  Performed as group-led instruction    Resistance Training Performed  Yes    VAD Patient?  No    PAD/SET Patient?  No      Pain Assessment   Currently in Pain?  No/denies    Multiple Pain Sites  No          Social History   Tobacco Use  Smoking Status Never Smoker  Smokeless Tobacco Never Used    Goals Met:  Proper associated with RPD/PD & O2 Sat Independence with exercise equipment Exercise tolerated well No report of cardiac concerns or symptoms Strength training completed today  Goals Unmet:  Not Applicable  Comments: Pt able to follow exercise prescription today without complaint.  Will continue to monitor for progression.    Dr. Emily Filbert is Medical Director for [place] and [facility].

## 2018-02-03 DIAGNOSIS — I272 Pulmonary hypertension, unspecified: Secondary | ICD-10-CM

## 2018-02-03 NOTE — Progress Notes (Signed)
Daily Session Note  Patient Details  Name: Sherri Price MRN: [id number] Date of Birth: Dec 22, 1933 Referring Provider:     Pulmonary Rehab from 12/22/2017 in [facility] Cardiac and Pulmonary Rehab  Referring Provider  Ramonita Lab MD      Encounter Date: 02/03/2018  Check In: Session Check In - 02/03/18 0944      Check-In   Supervising physician immediately available to respond to emergencies  [facility] immediately available ER MD    Physician(s)  Dr. Mariea Clonts and Dr. Alfred Levins    Location  [facility]-Cardiac & Pulmonary Rehab    Staff Present  Justin Mend Lorre Nick, MA, RCEP, CCRP, Exercise Physiologist;Amanda Oletta Darter, [place], ACSM CEP, Exercise Physiologist    Medication changes reported      No    Fall or balance concerns reported     No    Warm-up and Cool-down  Performed as group-led instruction    Resistance Training Performed  Yes    VAD Patient?  No      Pain Assessment   Currently in Pain?  No/denies          Social History   Tobacco Use  Smoking Status Never Smoker  Smokeless Tobacco Never Used    Goals Met:  Independence with exercise equipment Exercise tolerated well No report of cardiac concerns or symptoms Strength training completed today  Goals Unmet:  Not Applicable  Comments: Pt able to follow exercise prescription today without complaint.  Will continue to monitor for progression.   Dr. Emily Filbert is Medical Director for [place] and [facility].

## 2018-02-05 DIAGNOSIS — I272 Pulmonary hypertension, unspecified: Secondary | ICD-10-CM | POA: Diagnosis not present

## 2018-02-05 NOTE — Progress Notes (Signed)
Daily Session Note  Patient Details  Name: Sherri Price MRN: [id number] Date of Birth: 1933/10/26 Referring Provider:     Pulmonary Rehab from 12/22/2017 in [facility] Cardiac and Pulmonary Rehab  Referring Provider  Ramonita Lab MD      Encounter Date: 02/05/2018  Check In: Session Check In - 02/05/18 1017      Check-In   Supervising physician immediately available to respond to emergencies  [facility] immediately available ER MD    Physician(s)  Dr. Archie Balboa and Quentin Cornwall    Location  [facility]-Cardiac & Pulmonary Rehab    Staff Present  Justin Mend RCP,RRT,BSRT;Amanda Oletta Darter, BA, ACSM CEP, Exercise Physiologist;Meredith Sherryll Burger, RN BSN    Medication changes reported      No    Fall or balance concerns reported     No    Warm-up and Cool-down  Performed as group-led Higher education careers adviser Performed  Yes    VAD Patient?  No      Pain Assessment   Currently in Pain?  No/denies          Social History   Tobacco Use  Smoking Status Never Smoker  Smokeless Tobacco Never Used    Goals Met:  Independence with exercise equipment Exercise tolerated well No report of cardiac concerns or symptoms Strength training completed today  Goals Unmet:  Not Applicable  Comments: Pt able to follow exercise prescription today without complaint.  Will continue to monitor for progression.   Dr. Emily Filbert is Medical Director for [place] and [facility].

## 2018-02-08 ENCOUNTER — Encounter: Payer: Medicare Other | Admitting: *Deleted

## 2018-02-08 DIAGNOSIS — I272 Pulmonary hypertension, unspecified: Secondary | ICD-10-CM

## 2018-02-08 DIAGNOSIS — J8410 Pulmonary fibrosis, unspecified: Secondary | ICD-10-CM

## 2018-02-08 NOTE — Progress Notes (Signed)
Pulmonary Individual Treatment Plan  Patient Details  Name: Sherri Price MRN: [id number] Date of Birth: 07/04/1933 Referring Provider:     Pulmonary Rehab from 12/22/2017 in [facility]  Referring Provider  Klein, Bert MD      Initial Encounter Date:    Pulmonary Rehab from 12/22/2017 in [facility]  Date  12/22/17      Visit Diagnosis: Pulmonary hypertension (HCC)  Patient's Home Medications on Admission:  Current Outpatient Medications:  .  Ascorbic Acid (VITAMIN C) 1000 MG tablet, Take 1,000 mg by mouth 2 (two) times daily., Disp: , Rfl:  .  Cholecalciferol (VITAMIN D3) 5000 UNITS CAPS, Take 1 capsule by mouth daily., Disp: , Rfl:  .  Coenzyme Q10 100 MG capsule, Take 100 mg by mouth daily. , Disp: , Rfl:  .  Cranberry Fruit 405 MG CAPS, Take 1 capsule by mouth 2 (two) times daily. , Disp: , Rfl:  .  Evening Primrose Oil 500 MG CAPS, Take 1 capsule by mouth 2 (two) times daily. , Disp: , Rfl:  .  FLUOCINOLONE ACETONIDE SCALP 0.01 % OIL, 1 application by Other route daily as needed (psoriasis on scalp). , Disp: , Rfl:  .  L-Lysine 1000 MG TABS, Take 1 tablet by mouth 2 (two) times daily. , Disp: , Rfl:  .  levothyroxine (LEVOXYL) 100 MCG tablet, Take 100 mcg by mouth daily before breakfast. , Disp: , Rfl:  .  Multiple Vitamin (MULTIVITAMIN WITH MINERALS) TABS tablet, Take 1 tablet by mouth daily., Disp: , Rfl:  .  NON FORMULARY, Take 1 tablet by mouth 2 (two) times daily. calcium hydroxyapatite, Disp: , Rfl:  .  NON FORMULARY, Take 1 capsule by mouth 2 (two) times daily. With lunch and dinner. lactate prophase papain bromein, Disp: , Rfl:  .  NON FORMULARY, Take 1 tablet by mouth 2 (two) times daily. Quercetine, Disp: , Rfl:  .  NON FORMULARY, Take 1 Dose by mouth daily. Mixes these 3 OTC triple fiber, fiber smart, psyllium in a cup of juice every morning, Disp: , Rfl:  .  NON FORMULARY, Take 1 capsule by mouth 2 (two) times daily. Eye  health with lutein, Disp: , Rfl:  .  omeprazole (PRILOSEC) 20 MG capsule, Take 20 mg by mouth daily. , Disp: , Rfl:  .  Probiotic Product (PROBIOTIC DAILY PO), Take 2 tablets by mouth at bedtime., Disp: , Rfl:  .  Red Yeast Rice Extract 600 MG CAPS, Take 1 capsule by mouth 2 (two) times daily. , Disp: , Rfl:  .  rivaroxaban (XARELTO) 20 MG TABS tablet, Take 20 mg by mouth 2 (two) times daily at 10 AM and 5 PM., Disp: , Rfl:  .  TURMERIC CURCUMIN PO, Take 1 capsule by mouth daily. Use. One bid, Disp: , Rfl:  .  vitamin E 400 UNIT capsule, Take 400 Units by mouth 2 (two) times daily. , Disp: , Rfl:   Past Medical History: Past Medical History:  Diagnosis Date  . Arthritis   . Bell's palsy   . Cough    CHRONIC  . GERD (gastroesophageal reflux disease)   . Hypothyroidism   . IBS (irritable bowel syndrome)   . Neuropathy   . Oxygen decrease    USES AT NIGHT  . Pulmonary fibrosis (HCC)   . Shortness of breath dyspnea     Tobacco Use: Social History   Tobacco Use  Smoking Status Never Smoker  Smokeless Tobacco   Never Used    Labs: Recent Review Flowsheet Data    There is no flowsheet data to display.       Pulmonary Assessment Scores: Pulmonary Assessment Scores    Row Name 12/22/17 1225         ADL UCSD   ADL Phase  Entry     SOB Score total  74     Rest  4     Walk  4     Stairs  3     Bath  4     Dress  4     Shop  4       CAT Score   CAT Score  12       mMRC Score   mMRC Score  2        Pulmonary Function Assessment: Pulmonary Function Assessment - 12/22/17 1323      Breath   Bilateral Breath Sounds  Clear    Shortness of Breath  Fear of Shortness of Breath;Panic with Shortness of Breath;Limiting activity;Yes       Exercise Target Goals: Exercise Program Goal: Individual exercise prescription set using results from initial 6 min walk test and THRR while considering  patient's activity barriers and safety.   Exercise Prescription Goal: Initial  exercise prescription builds to 30-45 minutes a day of aerobic activity, 2-3 days per week.  Home exercise guidelines will be given to patient during program as part of exercise prescription that the participant will acknowledge.  Activity Barriers & Risk Stratification: Activity Barriers & Cardiac Risk Stratification - 12/22/17 1428      Activity Barriers & Cardiac Risk Stratification   Activity Barriers  Deconditioning;Muscular Weakness;Shortness of Breath       6 Minute Walk: 6 Minute Walk    Row Name 12/22/17 1425         6 Minute Walk   Phase  Initial     Distance  688 feet     Walk Time  6 minutes     # of Rest Breaks  0     MPH  1.3     METS  1.29     RPE  12     Perceived Dyspnea   2     VO2 Peak  4.51     Symptoms  Yes (comment)     Comments  SOB     Resting HR  68 bpm     Resting BP  124/62     Resting Oxygen Saturation   92 %     Exercise Oxygen Saturation  during 6 min walk  83 %     Max Ex. HR  89 bpm     Max Ex. BP  128/70     2 Minute Post BP  122/66       Interval HR   1 Minute HR  80     2 Minute HR  82     3 Minute HR  83     4 Minute HR  82     5 Minute HR  81     6 Minute HR  89     2 Minute Post HR  69     Interval Heart Rate?  Yes       Interval Oxygen   Interval Oxygen?  Yes     Baseline Oxygen Saturation %  92 %     1 Minute Oxygen Saturation %  88 %     1   Minute Liters of Oxygen  4 L     2 Minute Oxygen Saturation %  86 % 2:26 85%     2 Minute Liters of Oxygen  4 L     3 Minute Oxygen Saturation %  85 %     3 Minute Liters of Oxygen  4 L     4 Minute Oxygen Saturation %  84 % 4:20 83%     4 Minute Liters of Oxygen  4 L     5 Minute Oxygen Saturation %  86 %     5 Minute Liters of Oxygen  4 L     6 Minute Oxygen Saturation %  85 %     6 Minute Liters of Oxygen  4 L     2 Minute Post Oxygen Saturation %  90 %     2 Minute Post Liters of Oxygen  4 L       Oxygen Initial Assessment: Oxygen Initial Assessment - 12/22/17 1302       Home Oxygen   Home Oxygen Device  Home Concentrator;E-Tanks    Sleep Oxygen Prescription  Continuous    Liters per minute  3    Home Exercise Oxygen Prescription  Continuous    Liters per minute  4    Home at Rest Exercise Oxygen Prescription  Continuous    Liters per minute  4    Compliance with Home Oxygen Use  Yes      Initial 6 min Walk   Oxygen Used  Continuous    Liters per minute  4      Program Oxygen Prescription   Program Oxygen Prescription  Continuous    Liters per minute  4      Intervention   Short Term Goals  To learn and understand importance of monitoring SPO2 with Price oximeter and demonstrate accurate use of the Price oximeter.;To learn and demonstrate proper pursed lip breathing techniques or other breathing techniques.;To learn and understand importance of maintaining oxygen saturations>88%;To learn and demonstrate proper use of respiratory medications;To learn and exhibit compliance with exercise, home and travel O2 prescription    Long  Term Goals  Exhibits compliance with exercise, home and travel O2 prescription;Verbalizes importance of monitoring SPO2 with Price oximeter and return demonstration;Maintenance of O2 saturations>88%;Exhibits proper breathing techniques, such as pursed lip breathing or other method taught during program session;Compliance with respiratory medication;Demonstrates proper use of MDI's       Oxygen Re-Evaluation: Oxygen Re-Evaluation    Row Name 12/25/17 1018 01/22/18 1059           Program Oxygen Prescription   Program Oxygen Prescription  Continuous  Continuous      Liters per minute  4  4 6L walking        Home Oxygen   Home Oxygen Device  Home Concentrator;E-Tanks  Home Concentrator;E-Tanks      Sleep Oxygen Prescription  Continuous  Continuous      Liters per minute  3  3      Home Exercise Oxygen Prescription  Continuous  Continuous      Liters per minute  4  4      Home at Rest Exercise Oxygen Prescription   Continuous  Continuous      Liters per minute  4  4 sometimes will titrate to 5L      Compliance with Home Oxygen Use  -  Yes        Goals/Expected Outcomes  Short Term Goals  To learn and understand importance of monitoring SPO2 with Price oximeter and demonstrate accurate use of the Price oximeter.;To learn and demonstrate proper pursed lip breathing techniques or other breathing techniques.;To learn and understand importance of maintaining oxygen saturations>88%;To learn and demonstrate proper use of respiratory medications;To learn and exhibit compliance with exercise, home and travel O2 prescription  To learn and understand importance of monitoring SPO2 with Price oximeter and demonstrate accurate use of the Price oximeter.;To learn and demonstrate proper pursed lip breathing techniques or other breathing techniques.;To learn and understand importance of maintaining oxygen saturations>88%;To learn and demonstrate proper use of respiratory medications;To learn and exhibit compliance with exercise, home and travel O2 prescription      Long  Term Goals  Exhibits compliance with exercise, home and travel O2 prescription;Verbalizes importance of monitoring SPO2 with Price oximeter and return demonstration;Maintenance of O2 saturations>88%;Exhibits proper breathing techniques, such as pursed lip breathing or other method taught during program session;Compliance with respiratory medication;Demonstrates proper use of MDI's  Exhibits compliance with exercise, home and travel O2 prescription;Verbalizes importance of monitoring SPO2 with Price oximeter and return demonstration;Maintenance of O2 saturations>88%;Exhibits proper breathing techniques, such as pursed lip breathing or other method taught during program session;Compliance with respiratory medication;Demonstrates proper use of MDI's      Comments  Reviewed PLB technique with pt.  Talked about how it work and it's important to maintaining his exercise  saturations.    Sherri Price has been doing well and staying compliant with her oxygen therapy.  She is doing well on her oxygen and still getting used to using it constantly.  She has been using her PLB regualrly and finds helpful.       Goals/Expected Outcomes  Short: Become more profiecient at using PLB.   Long: Become independent at using PLB.  Short: Continue to use PLB and stay compliant with oxygen.  Long: Continue to be compliant.          Oxygen Discharge (Final Oxygen Re-Evaluation): Oxygen Re-Evaluation - 01/22/18 1059      Program Oxygen Prescription   Program Oxygen Prescription  Continuous    Liters per minute  4   6L walking     Home Oxygen   Home Oxygen Device  Home Concentrator;E-Tanks    Sleep Oxygen Prescription  Continuous    Liters per minute  3    Home Exercise Oxygen Prescription  Continuous    Liters per minute  4    Home at Rest Exercise Oxygen Prescription  Continuous    Liters per minute  4   sometimes will titrate to 5L   Compliance with Home Oxygen Use  Yes      Goals/Expected Outcomes   Short Term Goals  To learn and understand importance of monitoring SPO2 with Price oximeter and demonstrate accurate use of the Price oximeter.;To learn and demonstrate proper pursed lip breathing techniques or other breathing techniques.;To learn and understand importance of maintaining oxygen saturations>88%;To learn and demonstrate proper use of respiratory medications;To learn and exhibit compliance with exercise, home and travel O2 prescription    Long  Term Goals  Exhibits compliance with exercise, home and travel O2 prescription;Verbalizes importance of monitoring SPO2 with Price oximeter and return demonstration;Maintenance of O2 saturations>88%;Exhibits proper breathing techniques, such as pursed lip breathing or other method taught during program session;Compliance with respiratory medication;Demonstrates proper use of MDI's    Comments  Sherri Price has been doing well and  staying compliant with her oxygen therapy.  She is doing well on her oxygen and still getting used to using it constantly.  She has been using her PLB regualrly and finds helpful.     Goals/Expected Outcomes  Short: Continue to use PLB and stay compliant with oxygen.  Long: Continue to be compliant.        Initial Exercise Prescription: Initial Exercise Prescription - 12/22/17 1400      Date of Initial Exercise RX and Referring Provider   Date  12/22/17    Referring Provider  Ramonita Lab MD      Oxygen   Oxygen  Continuous    Liters  4-6      Treadmill   MPH  1.2    Grade  0.5    Minutes  15    METs  2      NuStep   Level  4    SPM  80    Minutes  15    METs  2      Biostep-RELP   Level  4    SPM  50    Minutes  15    METs  2      Prescription Details   Frequency (times per week)  3    Duration  Progress to 45 minutes of aerobic exercise without signs/symptoms of physical distress      Intensity   THRR 40-80% of Max Heartrate  96-125    Ratings of Perceived Exertion  11-13    Perceived Dyspnea  0-4      Progression   Progression  Continue to progress workloads to maintain intensity without signs/symptoms of physical distress.      Resistance Training   Training Prescription  Yes    Weight  2 lbs    Reps  10-15       Perform Capillary Blood Glucose checks as needed.  Exercise Prescription Changes: Exercise Prescription Changes    Row Name 12/22/17 1400 12/31/17 1000 01/08/18 1100 01/13/18 1500 01/28/18 1000     Response to Exercise   Blood Pressure (Admit)  124/62  116/60  116/60  136/78  120/62   Blood Pressure (Exercise)  128/70  130/70  130/70  -  -   Blood Pressure (Exit)  122/66  120/66  120/66  126/72  120/56   Heart Rate (Admit)  68 bpm  83 bpm  83 bpm  74 bpm  76 bpm   Heart Rate (Exercise)  89 bpm  74 bpm  74 bpm  76 bpm  78 bpm   Heart Rate (Exit)  69 bpm  62 bpm  62 bpm  75 bpm  69 bpm   Oxygen Saturation (Admit)  92 %  89 %  89 %  92 %  94  %   Oxygen Saturation (Exercise)  83 %  89 %  89 %  87 %  87 %   Oxygen Saturation (Exit)  90 %  96 %  96 %  97 %  90 %   Rating of Perceived Exertion (Exercise)  _0 Perceived Dyspnea (Exercise)  _1 Symptoms  SOB  -  -  -  -   Comments  walk test results  4th session in pulm  4th session in pulm  -  -   Duration  -  Progress to 45 minutes of aerobic exercise without signs/symptoms of physical distress  Progress to 45 minutes of aerobic exercise without signs/symptoms of physical distress  Continue with 45 min of aerobic exercise without signs/symptoms of physical distress.  Continue with 45 min of aerobic exercise without signs/symptoms of physical distress.   Intensity  -  THRR unchanged  THRR unchanged  THRR unchanged reaching RPE goals  THRR unchanged     Progression   Progression  -  -  -  Continue to progress workloads to maintain intensity without signs/symptoms of physical distress.  Continue to progress workloads to maintain intensity without signs/symptoms of physical distress.   Average METs  -  -  -  1.8  1.7     Resistance Training   Training Prescription  -  Yes  Yes  Yes  Yes   Weight  -  2 lb  2 lb  2 lb  2 lb   Reps  -  10-15  10-15  10-15  10-15     Interval Training   Interval Training  -  -  -  No  No     Oxygen   Oxygen  -  Continuous  Continuous  Continuous  Continuous   Liters  -  4-6  4-6  4-6  6     Treadmill   MPH  -  1.2  1.2  1.2  1.2   Grade  -  0.5  0.5  0.5  0.5   Minutes  -  _0 METs  -  _1 NuStep   Level  -  -  -  4  4   SPM  -  -  -  80  80   Minutes  -  -  -  15  15   METs  -  -  -  1.6  1.4     Arm Ergometer   Level  -  1  1  -  -   Minutes  -  15  15  -  -     Home Exercise Plan   Plans to continue exercise at  -  -  Home (comment) walks at home due to needing O2  -  -   Frequency  -  -  Add 2 additional days to program exercise sessions.  -  -   Initial Home Exercises Provided  -   -  01/08/18  -  -      Exercise Comments: Exercise Comments    Row Name 12/25/17 1016           Exercise Comments  First full day of exercise!  Patient was oriented to gym and equipment including functions, settings, policies, and procedures.  Patient's individual exercise prescription and treatment plan were reviewed.  All starting workloads were established based on the results of the 6 minute walk test done at initial orientation visit.  The plan for exercise progression was also introduced and progression will be customized based on patient's performance and goals.          Exercise Goals and Review: Exercise Goals    Row Name 12/22/17 1433             Exercise Goals   Increase Physical Activity  Yes       Intervention  Provide advice, education, support and counseling about physical activity/exercise needs.;Develop an individualized exercise prescription for aerobic and resistive training based on initial  evaluation findings, risk stratification, comorbidities and participant's personal goals.       Expected Outcomes  Short Term: Attend rehab on a regular basis to increase amount of physical activity.;Long Term: Add in home exercise to make exercise part of routine and to increase amount of physical activity.;Long Term: Exercising regularly at least 3-5 days a week.       Increase Strength and Stamina  Yes       Intervention  Provide advice, education, support and counseling about physical activity/exercise needs.;Develop an individualized exercise prescription for aerobic and resistive training based on initial evaluation findings, risk stratification, comorbidities and participant's personal goals.       Expected Outcomes  Short Term: Increase workloads from initial exercise prescription for resistance, speed, and METs.;Short Term: Perform resistance training exercises routinely during rehab and add in resistance training at home;Long Term: Improve cardiorespiratory fitness,  muscular endurance and strength as measured by increased METs and functional capacity (6MWT)       Able to understand and use rate of perceived exertion (RPE) scale  Yes       Intervention  Provide education and explanation on how to use RPE scale       Expected Outcomes  Long Term:  Able to use RPE to guide intensity level when exercising independently;Short Term: Able to use RPE daily in rehab to express subjective intensity level       Able to understand and use Dyspnea scale  Yes       Intervention  Provide education and explanation on how to use Dyspnea scale       Expected Outcomes  Short Term: Able to use Dyspnea scale daily in rehab to express subjective sense of shortness of breath during exertion;Long Term: Able to use Dyspnea scale to guide intensity level when exercising independently       Knowledge and understanding of Target Heart Rate Range (THRR)  Yes       Intervention  Provide education and explanation of THRR including how the numbers were predicted and where they are located for reference       Expected Outcomes  Short Term: Able to state/look up THRR;Long Term: Able to use THRR to govern intensity when exercising independently;Short Term: Able to use daily as guideline for intensity in rehab       Able to check Price independently  Yes       Intervention  Provide education and demonstration on how to check Price in carotid and radial arteries.;Review the importance of being able to check your own Price for safety during independent exercise       Expected Outcomes  Short Term: Able to explain why Price checking is important during independent exercise;Long Term: Able to check Price independently and accurately       Understanding of Exercise Prescription  Yes       Intervention  Provide education, explanation, and written materials on patient's individual exercise prescription       Expected Outcomes  Short Term: Able to explain program exercise prescription;Long Term: Able to  explain home exercise prescription to exercise independently          Exercise Goals Re-Evaluation : Exercise Goals Re-Evaluation    Row Name 12/25/17 1017 12/31/17 1044 01/08/18 1139 01/13/18 1505 01/22/18 1049     Exercise Goal Re-Evaluation   Exercise Goals Review  Able to understand and use Dyspnea scale;Knowledge and understanding of Target Heart Rate Range (THRR);Understanding of Exercise Prescription;Able to understand and use  rate of perceived exertion (RPE) scale  Increase Physical Activity;Increase Strength and Stamina;Able to understand and use rate of perceived exertion (RPE) scale;Able to understand and use Dyspnea scale  Increase Physical Activity;Increase Strength and Stamina;Able to understand and use rate of perceived exertion (RPE) scale;Able to understand and use Dyspnea scale;Knowledge and understanding of Target Heart Rate Range (THRR);Able to check Price independently;Understanding of Exercise Prescription  Increase Physical Activity;Increase Strength and Stamina;Able to understand and use rate of perceived exertion (RPE) scale;Able to understand and use Dyspnea scale  Increase Physical Activity;Increase Strength and Stamina;Understanding of Exercise Prescription   Comments  Reviewed RPE scale, THR and program prescription with pt today.  Pt voiced understanding and was given a copy of goals to take home.   Sherri Price has transitioned from [facility] to [facility].  Staff are working with her to have her try different equipment to improve her fitness.  She did not like the Biostep and so tried Arm Crank.  Staff will monitor progress.  Reviewed home exercise with pt today.  Pt plans to walk for exercise.  Reviewed THR, Price, RPE, sign and symptoms, NTG use, and when to call 911 or MD.  Also discussed weather considerations and indoor options.  Pt voiced understanding.  Sherri Price prefers TM to [employer] so she is doing TM for 30 min.  Her RPE indicates she is working somewhat hard while HR does not reach  target goals.  Sherri Price is doing well in rehab.  We have finally found three pieces that work for her.  She is considering joining [employer] after graduation.  She has been walking in the house and doing her weights at home.  She ususally will walk for 54mn at home.  We talked about increase time of walking to 331m.     Expected Outcomes  Short: Use RPE daily to regulate intensity. Long: Follow program prescription in THR.  Short - continue to attend and try different machines Long - maintain or improve current MET level  Short - Sherri Price will continue to walk at home on days not at class Sherri Price maintain current fitness level  Short - Continue to attend regularly Long - Maintain current level of fitness  Short: Increase exercise time at home to 3064m  Long: Continue to improve strength and stamina and activity level.     [place] 01/28/18 1008             Exercise Goal Re-Evaluation   Exercise Goals Review  Increase Physical Activity;Increase Strength and Stamina;Able to understand and use rate of perceived exertion (RPE) scale;Able to understand and use Dyspnea scale       Comments  Sherri Price is maintaining her MET level. She attends consistently.         Expected Outcomes  Short - continue to exercise regularly Long - maintain current MET level           Discharge Exercise Prescription (Final Exercise Prescription Changes): Exercise Prescription Changes - 01/28/18 1000      Response to Exercise   Blood Pressure (Admit)  120/62    Blood Pressure (Exit)  120/56    Heart Rate (Admit)  76 bpm    Heart Rate (Exercise)  78 bpm    Heart Rate (Exit)  69 bpm    Oxygen Saturation (Admit)  94 %    Oxygen Saturation (Exercise)  87 %    Oxygen Saturation (Exit)  90 %    Rating of Perceived Exertion (Exercise)  13  Perceived Dyspnea (Exercise)  3    Duration  Continue with 45 min of aerobic exercise without signs/symptoms of physical distress.    Intensity  THRR unchanged       Progression   Progression  Continue to progress workloads to maintain intensity without signs/symptoms of physical distress.    Average METs  1.7      Resistance Training   Training Prescription  Yes    Weight  2 lb    Reps  10-15      Interval Training   Interval Training  No      Oxygen   Oxygen  Continuous    Liters  6      Treadmill   MPH  1.2    Grade  0.5    Minutes  15    METs  2      NuStep   Level  4    SPM  80    Minutes  15    METs  1.4       Nutrition:  Target Goals: Understanding of nutrition guidelines, daily intake of sodium <1551m, cholesterol <2049m calories 30% from fat and 7% or less from saturated fats, daily to have 5 or more servings of fruits and vegetables.  Biometrics: Pre Biometrics - 12/22/17 1434      Pre Biometrics   Height  4' 11.2" (1.504 m)    Weight  99 lb 4.8 oz (45 kg)    Waist Circumference  27 inches    Hip Circumference  35.5 inches    Waist to Hip Ratio  0.76 %    BMI (Calculated)  19.91    Single Leg Stand  2.43 seconds        Nutrition Therapy Plan and Nutrition Goals: Nutrition Therapy & Goals - 12/28/17 1037      Nutrition Therapy   Diet  TLC    Protein (specify units)  6oz    Fiber  20 grams    Whole Grain Foods  3 servings    Saturated Fats  12 max. grams    Fruits and Vegetables  5 servings/day   8 ideal, eats small portions   Sodium  1500 grams      Personal Nutrition Goals   Nutrition Goal  Drink Boost nutritional shakes at least once per day to help meet calorie and protein needs. You can also look for the Ensure high protein variety    Personal Goal #2  Be consistent about consuming an evening snack, along with regular meals/ snacks throughout the day. Eating on a consistent schedule will help prevent wt loss    Personal Goal #3  Continue to include sources of calorie-dense foods such as fats in your diet regularly. Since you like nuts, these would make a great snack    Comments  She tries to eat as  much as she can at meal times but still struggles to meet daily energy needs. She has been eating dried fruit, nuts, and some protein shakes/ snacks      Intervention Plan   Intervention  Prescribe, educate and counsel regarding individualized specific dietary modifications aiming towards targeted core components such as weight, hypertension, lipid management, diabetes, heart failure and other comorbidities.    Expected Outcomes  Short Term Goal: Understand basic principles of dietary content, such as calories, fat, sodium, cholesterol and nutrients.;Short Term Goal: A plan has been developed with personal nutrition goals set during dietitian appointment.;Long Term Goal: Adherence to prescribed nutrition plan.  Nutrition Assessments: Nutrition Assessments - 12/22/17 1229      MEDFICTS Scores   Pre Score  13       Nutrition Goals Re-Evaluation: Nutrition Goals Re-Evaluation    [place] Name 12/28/17 1054 02/03/18 1045           Goals   Nutrition Goal  Be consistent about consuming an evening snack, along with regular meals/ snacks throughout the day. Eating on a consistent schedule will help prevent wt loss  Be consistent about consuming an evening snack and with eating on a regular schedule throughout the day. Include calorically-dense foods and nutritional suppelment drinks like Boost daily      Comment  She c/o struggling to eat enough at meal times despite trying to eat as much as she can  She is trying to include evening snacks but does not eat one every day. Lately she has been eating a banana _0 . She tries to eat regularly throughout the day and drinks Boost shakes, but not daily and typically only half or 3/4.      Expected Outcome  She will eat smaller, more frequent meals in an attempt to meet daily nutritional needs. Emphasize protein and fats  She will try cottage cheese as another evening snack option, and she will make more of an effort to drink 100% of Boost supplement and  consume 1 every day rather than every few days in order to maintain/gain weight        Personal Goal #2 Re-Evaluation   Personal Goal #2  Drink Boost nutritional shakes at least once per day to help meet calorie and protein needs. You can also look for the Ensure high protein variety  -        Personal Goal #3 Re-Evaluation   Personal Goal #3  Continue to include sources of calorie-dense foods such as fats in your diet regularly. Since you like nuts, these would make a great snack  -         Nutrition Goals Discharge (Final Nutrition Goals Re-Evaluation): Nutrition Goals Re-Evaluation - 02/03/18 1045      Goals   Nutrition Goal  Be consistent about consuming an evening snack and with eating on a regular schedule throughout the day. Include calorically-dense foods and nutritional suppelment drinks like Boost daily    Comment  She is trying to include evening snacks but does not eat one every day. Lately she has been eating a banana _1 . She tries to eat regularly throughout the day and drinks Boost shakes, but not daily and typically only half or 3/4.    Expected Outcome  She will try cottage cheese as another evening snack option, and she will make more of an effort to drink 100% of Boost supplement and consume 1 every day rather than every few days in order to maintain/gain weight       Psychosocial: Target Goals: Acknowledge presence or absence of significant depression and/or stress, maximize coping skills, provide positive support system. Participant is able to verbalize types and ability to use techniques and skills needed for reducing stress and depression.   Initial Review & Psychosocial Screening: Initial Psych Review & Screening - 12/22/17 1304      Initial Review   Current issues with  Current Stress Concerns    Source of Stress Concerns  Chronic Illness    Comments  Patient requires more oxygen than the last time she was in [place].      Family Dynamics   Good Support  System?  Yes    Comments  Her husband is a good support system. Most of her family is in [place].      Barriers   Psychosocial barriers to participate in program  The patient should benefit from training in stress management and relaxation.      Screening Interventions   Interventions  Encouraged to exercise;Program counselor consult;Provide feedback about the scores to participant;To provide support and resources with identified psychosocial needs    Expected Outcomes  Short Term goal: Identification and review with participant of any Quality of Life or Depression concerns found by scoring the questionnaire.;Long Term goal: The participant improves quality of Life and PHQ9 Scores as seen by post scores and/or verbalization of changes;Long Term Goal: Stressors or current issues are controlled or eliminated.;Short Term goal: Utilizing psychosocial counselor, staff and physician to assist with identification of specific Stressors or current issues interfering with healing process. Setting desired goal for each stressor or current issue identified.       Quality of Life Scores:  Scores of 19 and below usually indicate a poorer quality of life in these areas.  A difference of  2-3 points is a clinically meaningful difference.  A difference of 2-3 points in the total score of the Quality of Life Index has been associated with significant improvement in overall quality of life, self-image, physical symptoms, and general health in studies assessing change in quality of life.  PHQ-9: Recent Review Flowsheet Data    Depression screen [facility] 2/9 12/22/2017 07/22/2017 06/24/2017 05/05/2017 04/30/2017   Decreased Interest _0 0   Down, Depressed, Hopeless _1 0 0   PHQ - 2 Score _2 0   Altered sleeping _3 -   Tired, decreased energy _4 -   Change in appetite 1 1 0 0 -   Feeling bad or failure about yourself  0 0 0 0 -   Trouble concentrating 0 0 0 0 -   Moving slowly or fidgety/restless  1 0 0 0 -   Suicidal thoughts 0 0 0 0 -   PHQ-9 Score _5 -   Difficult doing work/chores Somewhat difficult - Somewhat difficult Very difficult -     Interpretation of Total Score  Total Score Depression Severity:  1-4 = Minimal depression, 5-9 = Mild depression, 10-14 = Moderate depression, 15-19 = Moderately severe depression, 20-27 = Severe depression   Psychosocial Evaluation and Intervention: Psychosocial Evaluation - 12/28/17 1113      Psychosocial Evaluation & Interventions   Interventions  Relaxation education    Comments  Counselor met with Ms. Sherri Price [facility] for a new intake psychosocial evaluation.  She has returned after several months of being out of the program and completed the CARE program in the interim.  She continues to have a strong support system and no new health diagnoses.  She reports sleeping well and takes natural supplements to help with this.  She states she's a little "dumpy" on occasion meaning sometimes a little "down in the dumps" with her mood but reports she is on medication to help with this, although counselor did not see anything in her medication list for mood.  Sherri Price states her health is the most stressful thing for her and having to be on oxygen 24/7.  She has goals to improve her breathing and stamina while in this program.      Expected Outcomes  Short:  Kiyra will exercise consistently and attend the educational components to learn positive ways to manage her condition.   Long:  Wendie will develop relaxation skills to help reduce the amount of oxygen needed.  She will continue to exercise and practice positive self-care.     Continue Psychosocial Services   Follow up required by staff       Psychosocial Re-Evaluation: Psychosocial Re-Evaluation    [place] Name 01/22/18 1054             Psychosocial Re-Evaluation   Current issues with  Current Stress Concerns;Current Sleep Concerns       Comments  Sherri Price has been doing well.  She admits to  having up and down.  She is still getting used to being on her oxygen and the lifestyle change that it has brought on.  She would rather use then not here at all.  She is sitll taking Melatonin at night and it continues to work for her.         Expected Outcomes  Short: Continue to practice her self care and come to class.  Long: Continue to stay postive.           Psychosocial Discharge (Final Psychosocial Re-Evaluation): Psychosocial Re-Evaluation - 01/22/18 1054      Psychosocial Re-Evaluation   Current issues with  Current Stress Concerns;Current Sleep Concerns    Comments  Sherri Price has been doing well.  She admits to having up and down.  She is still getting used to being on her oxygen and the lifestyle change that it has brought on.  She would rather use then not here at all.  She is sitll taking Melatonin at night and it continues to work for her.      Expected Outcomes  Short: Continue to practice her self care and come to class.  Long: Continue to stay postive.        Education: Education Goals: Education classes will be provided on a weekly basis, covering required topics. Participant will state understanding/return demonstration of topics presented.  Learning Barriers/Preferences: Learning Barriers/Preferences - 12/22/17 1228      Learning Barriers/Preferences   Learning Barriers  Sight   wears glasses   Learning Preferences  None       Education Topics:  Initial Evaluation Education: - Verbal, written and demonstration of respiratory meds, oximetry and breathing techniques. Instruction on use of nebulizers and MDIs and importance of monitoring MDI activations.   Pulmonary Rehab from 02/03/2018 in [facility] Cardiac and Pulmonary Rehab  Date  12/22/17  Educator  [facility]  Instruction Review Code  1- Verbalizes Understanding      General Nutrition Guidelines/Fats and Fiber: -Group instruction provided by verbal, written material, models and posters to present the general guidelines  for heart healthy nutrition. Gives an explanation and review of dietary fats and fiber.   Pulmonary Rehab from 02/03/2018 in [facility] Cardiac and Pulmonary Rehab  Date  01/27/18  Educator  LB  Instruction Review Code  -- [unable to stay due to appointment]      Controlling Sodium/Reading Food Labels: -Group verbal and written material supporting the discussion of sodium use in heart healthy nutrition. Review and explanation with models, verbal and written materials for utilization of the food label.   Pulmonary Rehab from 02/03/2018 in [facility] Cardiac and Pulmonary Rehab  Date  02/03/18  Educator  LB  Instruction Review Code  1- Verbalizes Understanding      Exercise Physiology & General Exercise Guidelines: - Group verbal  and written instruction with models to review the exercise physiology of the cardiovascular system and associated critical values. Provides general exercise guidelines with specific guidelines to those with heart or lung disease.    Pulmonary Rehab from 07/29/2017 in [facility] Cardiac and Pulmonary Rehab  Date  05/22/17  Educator  [facility]  Instruction Review Code  1- Verbalizes Understanding      Aerobic Exercise & Resistance Training: - Gives group verbal and written instruction on the various components of exercise. Focuses on aerobic and resistive training programs and the benefits of this training and how to safely progress through these programs.   Pulmonary Rehab from 07/29/2017 in [facility] Cardiac and Pulmonary Rehab  Date  06/05/17  Educator  AS  Instruction Review Code  1- Verbalizes Understanding      Flexibility, Balance, Mind/Body Relaxation: Provides group verbal/written instruction on the benefits of flexibility and balance training, including mind/body exercise modes such as yoga, pilates and tai chi.  Demonstration and skill practice provided.   Pulmonary Rehab from 07/29/2017 in [facility] Cardiac and Pulmonary Rehab  Date  07/01/17  Educator  AS  Instruction Review Code   1- Verbalizes Understanding      Stress and Anxiety: - Provides group verbal and written instruction about the health risks of elevated stress and causes of high stress.  Discuss the correlation between heart/lung disease and anxiety and treatment options. Review healthy ways to manage with stress and anxiety.   Pulmonary Rehab from 07/29/2017 in [facility] Cardiac and Pulmonary Rehab  Date  07/22/17  Educator  [facility]  Instruction Review Code  1- Verbalizes Understanding      Depression: - Provides group verbal and written instruction on the correlation between heart/lung disease and depressed mood, treatment options, and the stigmas associated with seeking treatment.   Pulmonary Rehab from 07/29/2017 in [facility] Cardiac and Pulmonary Rehab  Date  07/08/17  Educator  [facility]  Instruction Review Code  1- Verbalizes Understanding      Exercise & Equipment Safety: - Individual verbal instruction and demonstration of equipment use and safety with use of the equipment.   Pulmonary Rehab from 02/03/2018 in [facility] Cardiac and Pulmonary Rehab  Date  12/22/17  Educator  [facility]  Instruction Review Code  1- Verbalizes Understanding      Infection Prevention: - Provides verbal and written material to individual with discussion of infection control including proper hand washing and proper equipment cleaning during exercise session.   Pulmonary Rehab from 02/03/2018 in [facility] Cardiac and Pulmonary Rehab  Date  12/22/17  Educator  [facility]  Instruction Review Code  1- Verbalizes Understanding      Falls Prevention: - Provides verbal and written material to individual with discussion of falls prevention and safety.   Pulmonary Rehab from 02/03/2018 in [facility] Cardiac and Pulmonary Rehab  Date  12/22/17  Educator  [facility]  Instruction Review Code  1- Verbalizes Understanding      Diabetes: - Individual verbal and written instruction to review signs/symptoms of diabetes, desired ranges of glucose level fasting, after meals and  with exercise. Advice that pre and post exercise glucose checks will be done for 3 sessions at entry of program.   Chronic Lung Diseases: - Group verbal and written instruction to review updates, respiratory medications, advancements in procedures and treatments. Discuss use of supplemental oxygen including available portable oxygen systems, continuous and intermittent flow rates, concentrators, personal use and safety guidelines. Review proper use of inhaler and spacers. Provide informative websites for self-education.  Pulmonary Rehab from 02/03/2018 in [facility] Cardiac and Pulmonary Rehab  Date  01/29/18  Educator  [facility]  Instruction Review Code  1- Verbalizes Understanding      Energy Conservation: - Provide group verbal and written instruction for methods to conserve energy, plan and organize activities. Instruct on pacing techniques, use of adaptive equipment and posture/positioning to relieve shortness of breath.   Pulmonary Rehab from 02/03/2018 in [facility] Cardiac and Pulmonary Rehab  Date  01/06/18  Educator  [facility]  Instruction Review Code  1- Verbalizes Understanding      Triggers and Exacerbations: - Group verbal and written instruction to review types of environmental triggers and ways to prevent exacerbations. Discuss weather changes, air quality and the benefits of nasal washing. Review warning signs and symptoms to help prevent infections. Discuss techniques for effective airway clearance, coughing, and vibrations.   Pulmonary Rehab from 07/29/2017 in [facility] Cardiac and Pulmonary Rehab  Date  06/03/17  Educator  [facility]  Instruction Review Code  1- Verbalizes Understanding      AED/CPR: - Group verbal and written instruction with the use of models to demonstrate the basic use of the AED with the basic ABC's of resuscitation.   Pulmonary Rehab from 07/29/2017 in [facility] Cardiac and Pulmonary Rehab  Date  07/17/17  Educator  [facility]  Instruction Review Code  1- Armed forces training and education officer and Physiology of the Lungs: - Group verbal and written instruction with the use of models to provide basic lung anatomy and physiology related to function, structure and complications of lung disease.   Pulmonary Rehab from 07/29/2017 in [facility] Cardiac and Pulmonary Rehab  Date  07/29/17  Educator  [facility]  Instruction Review Code  1- Verbalizes Understanding      Anatomy & Physiology of the Heart: - Group verbal and written instruction and models provide basic cardiac anatomy and physiology, with the coronary electrical and arterial systems. Review of Valvular disease and Heart Failure   Pulmonary Rehab from 07/29/2017 in [facility] Cardiac and Pulmonary Rehab  Date  06/17/17  Educator  [facility]  Instruction Review Code  1- Verbalizes Understanding      Cardiac Medications: - Group verbal and written instruction to review commonly prescribed medications for heart disease. Reviews the medication, class of the drug, and side effects.   Pulmonary Rehab from 02/03/2018 in [facility] Cardiac and Pulmonary Rehab  Date  12/30/17  Educator  [facility]  Instruction Review Code  1- Verbalizes Understanding      Know Your Numbers and Risk Factors: -Group verbal and written instruction about important numbers in your health.  Discussion of what are risk factors and how they play a role in the disease process.  Review of Cholesterol, Blood Pressure, Diabetes, and BMI and the role they play in your overall health.   Pulmonary Rehab from 07/29/2017 in [facility] Cardiac and Pulmonary Rehab  Date  05/27/17  Educator  [facility]  Instruction Review Code  1- Verbalizes Understanding      Sleep Hygiene: -Provides group verbal and written instruction about how sleep can affect your health.  Define sleep hygiene, discuss sleep cycles and impact of sleep habits. Review good sleep hygiene tips.    Pulmonary Rehab from 02/03/2018 in [facility] Cardiac and Pulmonary Rehab  Date  01/20/18  Educator  [facility]  Instruction Review Code  1- Verbalizes  Understanding      Other: -Provides group and verbal instruction on various topics (see comments)   Pulmonary Rehab from 07/29/2017  in [facility] Cardiac and Pulmonary Rehab  Date  06/10/17  Educator  [facility]  Instruction Review Code  1- Verbalizes Understanding [SLEEP]       Knowledge Questionnaire Score: Knowledge Questionnaire Score - 12/22/17 1228      Knowledge Questionnaire Score   Pre Score  16/18   reviewed with patient       Core Components/Risk Factors/Patient Goals at Admission: Personal Goals and Risk Factors at Admission - 12/22/17 1302      Core Components/Risk Factors/Patient Goals on Admission    Weight Management  Weight Maintenance;Yes    Intervention  Weight Management: Develop a combined nutrition and exercise program designed to reach desired caloric intake, while maintaining appropriate intake of nutrient and fiber, sodium and fats, and appropriate energy expenditure required for the weight goal.;Weight Management/Obesity: Establish reasonable short term and long term weight goals.;Weight Management: Provide education and appropriate resources to help participant work on and attain dietary goals.    Admit Weight  99 lb 4.8 oz (45 kg)    Goal Weight: Long Term  100 lb (45.4 kg)    Expected Outcomes  Short Term: Continue to assess and modify interventions until short term weight is achieved;Long Term: Adherence to nutrition and physical activity/exercise program aimed toward attainment of established weight goal;Weight Maintenance: Understanding of the daily nutrition guidelines, which includes 25-35% calories from fat, 7% or less cal from saturated fats, less than 210m cholesterol, less than 1.5gm of sodium, & 5 or more servings of fruits and vegetables daily;Understanding recommendations for meals to include 15-35% energy as protein, 25-35% energy from fat, 35-60% energy from carbohydrates, less than 2092mof dietary cholesterol, 20-35 gm of total fiber daily;Understanding  of distribution of calorie intake throughout the day with the consumption of 4-5 meals/snacks    Improve shortness of breath with ADL's  Yes    Intervention  Provide education, individualized exercise plan and daily activity instruction to help decrease symptoms of SOB with activities of daily living.    Expected Outcomes  Short Term: Improve cardiorespiratory fitness to achieve a reduction of symptoms when performing ADLs;Long Term: Be able to perform more ADLs without symptoms or delay the onset of symptoms       Core Components/Risk Factors/Patient Goals Review:  Goals and Risk Factor Review    [place] Name 01/22/18 1056             Core Components/Risk Factors/Patient Goals Review   Personal Goals Review  Weight Management/Obesity;Improve shortness of breath with ADL's       Review  Sherri Price is doing well with her weight.  She is back up to 102lbs.  Her breathing continues to be a little moderate with exercise and she does need to exercise on a higher exercise flow.         Expected Outcomes  Short: Continue to work on weight maintenance.  Long: Continue to work on improving her breathing.           Core Components/Risk Factors/Patient Goals at Discharge (Final Review):  Goals and Risk Factor Review - 01/22/18 1056      Core Components/Risk Factors/Patient Goals Review   Personal Goals Review  Weight Management/Obesity;Improve shortness of breath with ADL's    Review  Sherri Price is doing well with her weight.  She is back up to 102lbs.  Her breathing continues to be a little moderate with exercise and she does need to exercise on a higher exercise flow.      Expected Outcomes  Short: Continue to work on weight maintenance.  Long: Continue to work on improving her breathing.        ITP Comments: ITP Comments    Row Name 12/22/17 1217 01/11/18 0829 02/08/18 0831       ITP Comments  Medical evaluation completed. Chart sent for review and changes to Dr. Caryl Comes for Dr. Emily Filbert director  of [place]. Diagnosis can be found in [facility] encounter 12/14/17  30 day review completed. ITP sent to Dr. Emily Filbert Director of [place]. Continue with ITP unless changes are made by physician  30 day review completed. ITP sent to Dr. Emily Filbert Director of [place]. Continue with ITP unless changes are made by physician        Comments: 30 day review

## 2018-02-08 NOTE — Progress Notes (Signed)
Daily Session Note  Patient Details  Name: Sherri Price MRN: [id number] Date of Birth: 1934/04/27 Referring Provider:     Pulmonary Rehab from 12/22/2017 in [facility] Cardiac and Pulmonary Rehab  Referring Provider  Ramonita Lab MD      Encounter Date: 02/08/2018  Check In: Session Check In - 02/08/18 1022      Check-In   Supervising physician immediately available to respond to emergencies  [facility] immediately available ER MD    Physician(s)  Dr. Jimmye Norman and Dr. Corky Downs    Location  [facility]-Cardiac & Pulmonary Rehab    Staff Present  Nada Maclachlan, BA, ACSM CEP, Exercise Physiologist;Joseph [facility] [place], [place], ACSM CEP, Exercise Physiologist    Medication changes reported      No    Fall or balance concerns reported     No    Tobacco Cessation  No Change    Warm-up and Cool-down  Performed as group-led instruction    Resistance Training Performed  Yes    VAD Patient?  No    PAD/SET Patient?  No      Pain Assessment   Currently in Pain?  No/denies    Multiple Pain Sites  No          Social History   Tobacco Use  Smoking Status Never Smoker  Smokeless Tobacco Never Used    Goals Met:  Proper associated with RPD/PD & O2 Sat Independence with exercise equipment Exercise tolerated well No report of cardiac concerns or symptoms Strength training completed today  Goals Unmet:  Not Applicable  Comments: Pt able to follow exercise prescription today without complaint.  Will continue to monitor for progression.    Dr. Emily Filbert is Medical Director for [place] and [facility].

## 2018-02-10 DIAGNOSIS — J8410 Pulmonary fibrosis, unspecified: Secondary | ICD-10-CM

## 2018-02-10 DIAGNOSIS — I272 Pulmonary hypertension, unspecified: Secondary | ICD-10-CM | POA: Diagnosis not present

## 2018-02-10 NOTE — Progress Notes (Signed)
Daily Session Note  Patient Details  Name: Sherri Price MRN: [id number] Date of Birth: May 14, 1934 Referring Provider:     Pulmonary Rehab from 12/22/2017 in [facility] Cardiac and Pulmonary Rehab  Referring Provider  Ramonita Lab MD      Encounter Date: 02/10/2018  Check In:      Social History   Tobacco Use  Smoking Status Never Smoker  Smokeless Tobacco Never Used    Goals Met:  Proper associated with RPD/PD & O2 Sat Independence with exercise equipment Exercise tolerated well Strength training completed today  Goals Unmet:  Not Applicable  Comments: Pt able to follow exercise prescription today without complaint.  Will continue to monitor for progression.    Dr. Emily Filbert is Medical Director for [place] and [facility].

## 2018-02-12 DIAGNOSIS — I272 Pulmonary hypertension, unspecified: Secondary | ICD-10-CM | POA: Diagnosis not present

## 2018-02-12 NOTE — Progress Notes (Signed)
Daily Session Note  Patient Details  Name: Sherri Price MRN: [id number] Date of Birth: [birth year] Referring Provider:     Pulmonary Rehab from 12/22/2017 in [facility] Cardiac and Pulmonary Rehab  Referring Provider  Ramonita Lab MD      Encounter Date: 02/12/2018  Check In: Session Check In - 02/12/18 0947      Check-In   Supervising physician immediately available to respond to emergencies  [facility] immediately available ER MD    Physician(s)  Dr. Corky Downs and Clearnce Hasten    Location  [facility]-Cardiac & Pulmonary Rehab    Staff Present  Justin Mend RCP,RRT,BSRT;Amanda Oletta Darter, BA, ACSM CEP, Exercise Physiologist;Meredith Sherryll Burger, RN BSN    Medication changes reported      No    Fall or balance concerns reported     No    Warm-up and Cool-down  Performed as group-led Higher education careers adviser Performed  Yes    VAD Patient?  No    PAD/SET Patient?  No      Pain Assessment   Currently in Pain?  No/denies          Social History   Tobacco Use  Smoking Status Never Smoker  Smokeless Tobacco Never Used    Goals Met:  Independence with exercise equipment Exercise tolerated well No report of cardiac concerns or symptoms Strength training completed today  Goals Unmet:  Not Applicable  Comments: Pt able to follow exercise prescription today without complaint.  Will continue to monitor for progression.    Dr. Emily Filbert is Medical Director for [place] and [facility].

## 2018-02-15 DIAGNOSIS — I272 Pulmonary hypertension, unspecified: Secondary | ICD-10-CM | POA: Diagnosis not present

## 2018-02-15 NOTE — Progress Notes (Signed)
Daily Session Note  Patient Details  Name: Sherri Price MRN: [id number] Date of Birth: 1934-05-18 Referring Provider:     Pulmonary Rehab from 12/22/2017 in [facility] Cardiac and Pulmonary Rehab  Referring Provider  Ramonita Lab MD      Encounter Date: 02/15/2018  Check In: Session Check In - 02/15/18 0949      Check-In   Supervising physician immediately available to respond to emergencies  [facility] immediately available ER MD    Physician(s)  Dr. Burlene Arnt and Quentin Cornwall    Location  [facility]Cardiac & Pulmonary Rehab    Staff Present  Justin Mend Jaci Carrel, BS, ACSM CEP, Exercise Physiologist;Amanda Oletta Darter, [place], ACSM CEP, Exercise Physiologist    Medication changes reported      No    Fall or balance concerns reported     No    Warm-up and Cool-down  Performed as group-led instruction    Resistance Training Performed  Yes    VAD Patient?  No    PAD/SET Patient?  No      Pain Assessment   Currently in Pain?  No/denies          Social History   Tobacco Use  Smoking Status Never Smoker  Smokeless Tobacco Never Used    Goals Met:  Independence with exercise equipment Exercise tolerated well No report of cardiac concerns or symptoms Strength training completed today  Goals Unmet:  Not Applicable  Comments: Pt able to follow exercise prescription today without complaint.  Will continue to monitor for progression.    Dr. Emily Filbert is Medical Director for [place] and [facility].

## 2018-02-17 ENCOUNTER — Encounter: Payer: Medicare Other | Attending: Internal Medicine | Admitting: *Deleted

## 2018-02-17 DIAGNOSIS — K219 Gastro-esophageal reflux disease without esophagitis: Secondary | ICD-10-CM | POA: Diagnosis not present

## 2018-02-17 DIAGNOSIS — Z79890 Hormone replacement therapy (postmenopausal): Secondary | ICD-10-CM | POA: Diagnosis not present

## 2018-02-17 DIAGNOSIS — E039 Hypothyroidism, unspecified: Secondary | ICD-10-CM | POA: Insufficient documentation

## 2018-02-17 DIAGNOSIS — J8410 Pulmonary fibrosis, unspecified: Secondary | ICD-10-CM | POA: Insufficient documentation

## 2018-02-17 DIAGNOSIS — R0602 Shortness of breath: Secondary | ICD-10-CM | POA: Insufficient documentation

## 2018-02-17 DIAGNOSIS — I272 Pulmonary hypertension, unspecified: Secondary | ICD-10-CM | POA: Diagnosis not present

## 2018-02-17 DIAGNOSIS — Z79899 Other long term (current) drug therapy: Secondary | ICD-10-CM | POA: Insufficient documentation

## 2018-02-17 NOTE — Progress Notes (Signed)
Daily Session Note  Patient Details  Name: Sherri Price MRN: [id number] Date of Birth: March 07, 1934 Referring Provider:     Pulmonary Rehab from 12/22/2017 in [facility] Cardiac and Pulmonary Rehab  Referring Provider  Ramonita Lab MD      Encounter Date: 02/17/2018  Check In: Session Check In - 02/17/18 1137      Check-In   Supervising physician immediately available to respond to emergencies  [facility] immediately available ER MD    Physician(s)  Dr. Joni Fears and Jimmye Norman    Location  [facility]-Cardiac & Pulmonary Rehab    Staff Present  Renita Papa, RN BSN;Jessica Luan Pulling, MA, RCEP, CCRP, Exercise Physiologist;Joseph Tessie Fass RCP,RRT,BSRT    Medication changes reported      No    Fall or balance concerns reported     No    Tobacco Cessation  No Change    Warm-up and Cool-down  Performed as group-led instruction    Resistance Training Performed  Yes    VAD Patient?  No    PAD/SET Patient?  No      Pain Assessment   Currently in Pain?  No/denies          Social History   Tobacco Use  Smoking Status Never Smoker  Smokeless Tobacco Never Used    Goals Met:  Proper associated with RPD/PD & O2 Sat Independence with exercise equipment Using PLB without cueing & demonstrates good technique Exercise tolerated well No report of cardiac concerns or symptoms Strength training completed today  Goals Unmet:  Not Applicable  Comments: Pt able to follow exercise prescription today without complaint.  Will continue to monitor for progression.    Dr. Emily Filbert is Medical Director for [place] and [facility].

## 2018-02-19 ENCOUNTER — Encounter: Payer: Medicare Other | Admitting: *Deleted

## 2018-02-19 DIAGNOSIS — I272 Pulmonary hypertension, unspecified: Secondary | ICD-10-CM | POA: Diagnosis not present

## 2018-02-19 NOTE — Progress Notes (Signed)
Daily Session Note  Patient Details  Name: Sherri Price MRN: [id number] Date of Birth: [birth year] Referring Provider:     Pulmonary Rehab from 12/22/2017 in [facility] Cardiac and Pulmonary Rehab  Referring Provider  Ramonita Lab MD      Encounter Date: 02/19/2018  Check In: Session Check In - 02/19/18 1017      Check-In   Supervising physician immediately available to respond to emergencies  [facility] immediately available ER MD    Physician(s)  Dr. Reita Cliche and Alfred Levins    Location  [facility]-Cardiac & Pulmonary Rehab    Staff Present  Constance Goltz, RN BSN;Meredith Sherryll Burger, RN Vickki Hearing, BA, ACSM CEP, Exercise Physiologist    Medication changes reported      No    Fall or balance concerns reported     No    Tobacco Cessation  No Change    Warm-up and Cool-down  Performed as group-led instruction    Resistance Training Performed  Yes    VAD Patient?  No    PAD/SET Patient?  No      Pain Assessment   Currently in Pain?  No/denies          Social History   Tobacco Use  Smoking Status Never Smoker  Smokeless Tobacco Never Used    Goals Met:  Proper associated with RPD/PD & O2 Sat Independence with exercise equipment Using PLB without cueing & demonstrates good technique Exercise tolerated well Personal goals reviewed No report of cardiac concerns or symptoms Strength training completed today  Goals Unmet:  Not Applicable  Comments: Pt able to follow exercise prescription today without complaint.  Will continue to monitor for progression.    Dr. Emily Filbert is Medical Director for [place] and [facility].

## 2018-02-22 DIAGNOSIS — I272 Pulmonary hypertension, unspecified: Secondary | ICD-10-CM | POA: Diagnosis not present

## 2018-02-22 NOTE — Progress Notes (Signed)
Daily Session Note  Patient Details  Name: Sherri Price MRN: [id number] Date of Birth: 1934-04-04 Referring Provider:     Pulmonary Rehab from 12/22/2017 in [facility] Cardiac and Pulmonary Rehab  Referring Provider  Ramonita Lab MD      Encounter Date: 02/22/2018  Check In: Session Check In - 02/22/18 0949      Check-In   Supervising physician immediately available to respond to emergencies  [facility] immediately available ER MD    Physician(s)  Dr. Corky Downs and Quentin Cornwall    Location  [facility]Cardiac & Pulmonary Rehab    Staff Present  Justin Mend Jaci Carrel, BS, ACSM CEP, Exercise Physiologist;Amanda Oletta Darter, [place], ACSM CEP, Exercise Physiologist    Medication changes reported      No    Fall or balance concerns reported     No    Warm-up and Cool-down  Performed as group-led instruction    Resistance Training Performed  Yes    VAD Patient?  No    PAD/SET Patient?  No      Pain Assessment   Currently in Pain?  No/denies          Social History   Tobacco Use  Smoking Status Never Smoker  Smokeless Tobacco Never Used    Goals Met:  Independence with exercise equipment Exercise tolerated well No report of cardiac concerns or symptoms Strength training completed today  Goals Unmet:  Not Applicable  Comments: Pt able to follow exercise prescription today without complaint.  Will continue to monitor for progression.   Dr. Emily Filbert is Medical Director for [place] and [facility].

## 2018-02-24 DIAGNOSIS — I272 Pulmonary hypertension, unspecified: Secondary | ICD-10-CM

## 2018-02-24 DIAGNOSIS — J8410 Pulmonary fibrosis, unspecified: Secondary | ICD-10-CM

## 2018-02-24 NOTE — Progress Notes (Signed)
Daily Session Note  Patient Details  Name: Sherri Price MRN: [id number] Date of Birth: 08-14-1933 Referring Provider:     Pulmonary Rehab from 12/22/2017 in [facility] Cardiac and Pulmonary Rehab  Referring Provider  Sherri Lab MD      Encounter Date: 02/24/2018  Check In: Session Check In - 02/24/18 1054      Check-In   Supervising physician immediately available to respond to emergencies  [facility] immediately available ER MD    Physician(s)  Darl Householder and Jimmye Norman    Location  [facility]-Cardiac & Pulmonary Rehab    Staff Present  Alberteen Sam, MA, RCEP, CCRP, Exercise Physiologist;Joseph Foy [place], [place], ACSM CEP, Exercise Physiologist    Medication changes reported      No    Fall or balance concerns reported     No    Warm-up and Cool-down  Performed as group-led instruction    Resistance Training Performed  Yes    VAD Patient?  No    PAD/SET Patient?  No      Pain Assessment   Currently in Pain?  No/denies    Multiple Pain Sites  No          Social History   Tobacco Use  Smoking Status Never Smoker  Smokeless Tobacco Never Used    Goals Met:  Proper associated with RPD/PD & O2 Sat Independence with exercise equipment Exercise tolerated well Strength training completed today  Goals Unmet:  Not Applicable  Comments: Pt able to follow exercise prescription today without complaint.  Will continue to monitor for progression.    Dr. Emily Filbert is Medical Director for [place] and [facility].

## 2018-02-26 ENCOUNTER — Encounter: Payer: Medicare Other | Admitting: *Deleted

## 2018-02-26 DIAGNOSIS — I272 Pulmonary hypertension, unspecified: Secondary | ICD-10-CM

## 2018-02-26 NOTE — Progress Notes (Signed)
Daily Session Note  Patient Details  Name: Sherri Price MRN: [id number] Date of Birth: February 21, 1934 Referring Provider:     Pulmonary Rehab from 12/22/2017 in [facility] Cardiac and Pulmonary Rehab  Referring Provider  Ramonita Lab MD      Encounter Date: 02/26/2018  Check In: Session Check In - 02/26/18 1018      Check-In   Supervising physician immediately available to respond to emergencies  [facility] immediately available ER MD    Physician(s)  Dr. Artis Flock and Clearnce Hasten    Location  [facility]-Cardiac & Pulmonary Rehab    Staff Present  Renita Papa, RN BSN;Joseph [place]oy [place], [place], ACSM CEP, Exercise Physiologist    Medication changes reported      No    Fall or balance concerns reported     No    Warm-up and Cool-down  Performed as group-led instruction    Resistance Training Performed  Yes    VAD Patient?  No    PAD/SET Patient?  No      Pain Assessment   Currently in Pain?  No/denies          Social History   Tobacco Use  Smoking Status Never Smoker  Smokeless Tobacco Never Used    Goals Met:  Proper associated with RPD/PD & O2 Sat Independence with exercise equipment Exercise tolerated well No report of cardiac concerns or symptoms Strength training completed today  Goals Unmet:  Not Applicable  Comments: Pt able to follow exercise prescription today without complaint.  Will continue to monitor for progression.    Dr. Emily Filbert is Medical Director for [place] and [facility].

## 2018-03-03 DIAGNOSIS — I272 Pulmonary hypertension, unspecified: Secondary | ICD-10-CM | POA: Diagnosis not present

## 2018-03-03 NOTE — Progress Notes (Signed)
Daily Session Note  Patient Details  Name: Sherri Price MRN: [id number] Date of Birth: 1934/04/27 Referring Provider:     Pulmonary Rehab from 12/22/2017 in [facility] Cardiac and Pulmonary Rehab  Referring Provider  Ramonita Lab MD      Encounter Date: 03/03/2018  Check In: Session Check In - 03/03/18 0950      Check-In   Supervising physician immediately available to respond to emergencies  [facility] immediately available ER MD    Physician(s)  Dr. Burlene Arnt and Jimmye Norman    Location  [facility]-Cardiac & Pulmonary Rehab    Staff Present  Alberteen Sam, MA, RCEP, CCRP, Exercise Physiologist;Joseph [facility], [place], ACSM CEP, Exercise Physiologist    Medication changes reported      No    Fall or balance concerns reported     No    Warm-up and Cool-down  Performed as group-led instruction    Resistance Training Performed  Yes    VAD Patient?  No    PAD/SET Patient?  No      Pain Assessment   Currently in Pain?  No/denies          Social History   Tobacco Use  Smoking Status Never Smoker  Smokeless Tobacco Never Used    Goals Met:  Independence with exercise equipment Exercise tolerated well No report of cardiac concerns or symptoms Strength training completed today  Goals Unmet:  Not Applicable  Comments: Pt able to follow exercise prescription today without complaint.  Will continue to monitor for progression.    Dr. Emily Filbert is Medical Director for [place] and [facility].

## 2018-03-05 DIAGNOSIS — I272 Pulmonary hypertension, unspecified: Secondary | ICD-10-CM

## 2018-03-05 NOTE — Progress Notes (Signed)
Daily Session Note  Patient Details  Name: Sherri Price MRN: [id number] Date of Birth: May 07, 1934 Referring Provider:     Pulmonary Rehab from 12/22/2017 in [facility] Cardiac and Pulmonary Rehab  Referring Provider  Ramonita Lab MD      Encounter Date: 03/05/2018  Check In: Session Check In - 03/05/18 0948      Check-In   Supervising physician immediately available to respond to emergencies  [facility] immediately available ER MD    Physician(s)  Dr. Quentin Cornwall and [facility]    Location  [facility]-Cardiac & Pulmonary Rehab    Staff Present  Justin Mend RCP,RRT,BSRT;Meredith Sherryll Burger, RN Geralyn Corwin, RN BSN    Medication changes reported      No    Fall or balance concerns reported     No    Warm-up and Cool-down  Performed as group-led Higher education careers adviser Performed  Yes    VAD Patient?  No    PAD/SET Patient?  No      Pain Assessment   Currently in Pain?  No/denies          Social History   Tobacco Use  Smoking Status Never Smoker  Smokeless Tobacco Never Used    Goals Met:  Independence with exercise equipment Exercise tolerated well No report of cardiac concerns or symptoms Strength training completed today  Goals Unmet:  Not Applicable  Comments: Pt able to follow exercise prescription today without complaint.  Will continue to monitor for progression.    Dr. Emily Filbert is Medical Director for [place] and [facility].

## 2018-03-08 ENCOUNTER — Encounter: Payer: Medicare Other | Admitting: *Deleted

## 2018-03-08 DIAGNOSIS — I272 Pulmonary hypertension, unspecified: Secondary | ICD-10-CM

## 2018-03-08 DIAGNOSIS — J8410 Pulmonary fibrosis, unspecified: Secondary | ICD-10-CM

## 2018-03-08 NOTE — Progress Notes (Signed)
Pulmonary Individual Treatment Plan  Patient Details  Name: Sherri Price MRN: [id number] Date of Birth: 04/15/1934 Referring Provider:     Pulmonary Rehab from 12/22/2017 in [facility]  Referring Provider  Klein, Bert MD      Initial Encounter Date:    Pulmonary Rehab from 12/22/2017 in [facility]  Date  12/22/17      Visit Diagnosis: Pulmonary hypertension (HCC)  Patient's Home Medications on Admission:  Current Outpatient Medications:  .  Ascorbic Acid (VITAMIN C) 1000 MG tablet, Take 1,000 mg by mouth 2 (two) times daily., Disp: , Rfl:  .  Cholecalciferol (VITAMIN D3) 5000 UNITS CAPS, Take 1 capsule by mouth daily., Disp: , Rfl:  .  Coenzyme Q10 100 MG capsule, Take 100 mg by mouth daily. , Disp: , Rfl:  .  Cranberry Fruit 405 MG CAPS, Take 1 capsule by mouth 2 (two) times daily. , Disp: , Rfl:  .  Evening Primrose Oil 500 MG CAPS, Take 1 capsule by mouth 2 (two) times daily. , Disp: , Rfl:  .  FLUOCINOLONE ACETONIDE SCALP 0.01 % OIL, 1 application by Other route daily as needed (psoriasis on scalp). , Disp: , Rfl:  .  L-Lysine 1000 MG TABS, Take 1 tablet by mouth 2 (two) times daily. , Disp: , Rfl:  .  levothyroxine (LEVOXYL) 100 MCG tablet, Take 100 mcg by mouth daily before breakfast. , Disp: , Rfl:  .  Multiple Vitamin (MULTIVITAMIN WITH MINERALS) TABS tablet, Take 1 tablet by mouth daily., Disp: , Rfl:  .  NON FORMULARY, Take 1 tablet by mouth 2 (two) times daily. calcium hydroxyapatite, Disp: , Rfl:  .  NON FORMULARY, Take 1 capsule by mouth 2 (two) times daily. With lunch and dinner. lactate prophase papain bromein, Disp: , Rfl:  .  NON FORMULARY, Take 1 tablet by mouth 2 (two) times daily. Quercetine, Disp: , Rfl:  .  NON FORMULARY, Take 1 Dose by mouth daily. Mixes these 3 OTC triple fiber, fiber smart, psyllium in a cup of juice every morning, Disp: , Rfl:  .  NON FORMULARY, Take 1 capsule by mouth 2 (two) times daily. Eye  health with lutein, Disp: , Rfl:  .  omeprazole (PRILOSEC) 20 MG capsule, Take 20 mg by mouth daily. , Disp: , Rfl:  .  Probiotic Product (PROBIOTIC DAILY PO), Take 2 tablets by mouth at bedtime., Disp: , Rfl:  .  Red Yeast Rice Extract 600 MG CAPS, Take 1 capsule by mouth 2 (two) times daily. , Disp: , Rfl:  .  rivaroxaban (XARELTO) 20 MG TABS tablet, Take 20 mg by mouth 2 (two) times daily at 10 AM and 5 PM., Disp: , Rfl:  .  TURMERIC CURCUMIN PO, Take 1 capsule by mouth daily. Use. One bid, Disp: , Rfl:  .  vitamin E 400 UNIT capsule, Take 400 Units by mouth 2 (two) times daily. , Disp: , Rfl:   Past Medical History: Past Medical History:  Diagnosis Date  . Arthritis   . Bell's palsy   . Cough    CHRONIC  . GERD (gastroesophageal reflux disease)   . Hypothyroidism   . IBS (irritable bowel syndrome)   . Neuropathy   . Oxygen decrease    USES AT NIGHT  . Pulmonary fibrosis (HCC)   . Shortness of breath dyspnea     Tobacco Use: Social History   Tobacco Use  Smoking Status Never Smoker  Smokeless Tobacco   Never Used    Labs: Recent Review Flowsheet Data    There is no flowsheet data to display.       Pulmonary Assessment Scores: Pulmonary Assessment Scores    Row Name 12/22/17 1225         ADL UCSD   ADL Phase  Entry     SOB Score total  74     Rest  4     Walk  4     Stairs  3     Bath  4     Dress  4     Shop  4       CAT Score   CAT Score  12       mMRC Score   mMRC Score  2        Pulmonary Function Assessment: Pulmonary Function Assessment - 12/22/17 1323      Breath   Bilateral Breath Sounds  Clear    Shortness of Breath  Fear of Shortness of Breath;Panic with Shortness of Breath;Limiting activity;Yes       Exercise Target Goals: Exercise Program Goal: Individual exercise prescription set using results from initial 6 min walk test and THRR while considering  patient's activity barriers and safety.   Exercise Prescription Goal: Initial  exercise prescription builds to 30-45 minutes a day of aerobic activity, 2-3 days per week.  Home exercise guidelines will be given to patient during program as part of exercise prescription that the participant will acknowledge.  Activity Barriers & Risk Stratification: Activity Barriers & Cardiac Risk Stratification - 12/22/17 1428      Activity Barriers & Cardiac Risk Stratification   Activity Barriers  Deconditioning;Muscular Weakness;Shortness of Breath       6 Minute Walk: 6 Minute Walk    Row Name 12/22/17 1425         6 Minute Walk   Phase  Initial     Distance  688 feet     Walk Time  6 minutes     # of Rest Breaks  0     MPH  1.3     METS  1.29     RPE  12     Perceived Dyspnea   2     VO2 Peak  4.51     Symptoms  Yes (comment)     Comments  SOB     Resting HR  68 bpm     Resting BP  124/62     Resting Oxygen Saturation   92 %     Exercise Oxygen Saturation  during 6 min walk  83 %     Max Ex. HR  89 bpm     Max Ex. BP  128/70     2 Minute Post BP  122/66       Interval HR   1 Minute HR  80     2 Minute HR  82     3 Minute HR  83     4 Minute HR  82     5 Minute HR  81     6 Minute HR  89     2 Minute Post HR  69     Interval Heart Rate?  Yes       Interval Oxygen   Interval Oxygen?  Yes     Baseline Oxygen Saturation %  92 %     1 Minute Oxygen Saturation %  88 %     1   Minute Liters of Oxygen  4 L     2 Minute Oxygen Saturation %  86 % 2:26 85%     2 Minute Liters of Oxygen  4 L     3 Minute Oxygen Saturation %  85 %     3 Minute Liters of Oxygen  4 L     4 Minute Oxygen Saturation %  84 % 4:20 83%     4 Minute Liters of Oxygen  4 L     5 Minute Oxygen Saturation %  86 %     5 Minute Liters of Oxygen  4 L     6 Minute Oxygen Saturation %  85 %     6 Minute Liters of Oxygen  4 L     2 Minute Post Oxygen Saturation %  90 %     2 Minute Post Liters of Oxygen  4 L       Oxygen Initial Assessment: Oxygen Initial Assessment - 12/22/17 1302       Home Oxygen   Home Oxygen Device  Home Concentrator;E-Tanks    Sleep Oxygen Prescription  Continuous    Liters per minute  3    Home Exercise Oxygen Prescription  Continuous    Liters per minute  4    Home at Rest Exercise Oxygen Prescription  Continuous    Liters per minute  4    Compliance with Home Oxygen Use  Yes      Initial 6 min Walk   Oxygen Used  Continuous    Liters per minute  4      Program Oxygen Prescription   Program Oxygen Prescription  Continuous    Liters per minute  4      Intervention   Short Term Goals  To learn and understand importance of monitoring SPO2 with pulse oximeter and demonstrate accurate use of the pulse oximeter.;To learn and demonstrate proper pursed lip breathing techniques or other breathing techniques.;To learn and understand importance of maintaining oxygen saturations>88%;To learn and demonstrate proper use of respiratory medications;To learn and exhibit compliance with exercise, home and travel O2 prescription    Long  Term Goals  Exhibits compliance with exercise, home and travel O2 prescription;Verbalizes importance of monitoring SPO2 with pulse oximeter and return demonstration;Maintenance of O2 saturations>88%;Exhibits proper breathing techniques, such as pursed lip breathing or other method taught during program session;Compliance with respiratory medication;Demonstrates proper use of MDI's       Oxygen Re-Evaluation: Oxygen Re-Evaluation    Row Name 12/25/17 1018 01/22/18 1059 02/10/18 1535 02/19/18 1037       Program Oxygen Prescription   Program Oxygen Prescription  Continuous  Continuous  Continuous  Continuous    Liters per minute  4  4 6L walking  '4  4 6 '$ liters on treadmill      Home Oxygen   Home Oxygen Device  Home Concentrator;E-Tanks  Home Concentrator;E-Tanks  Home Concentrator;E-Tanks  Home Concentrator;E-Tanks    Sleep Oxygen Prescription  Continuous  Continuous  Continuous  Continuous    Liters per minute  '3  3  3  3     '$ Home Exercise Oxygen Prescription  Continuous  Continuous  Continuous  Continuous    Liters per minute  '4  4  4  4 '$ sometimes will increase to 5L    Home at Rest Exercise Oxygen Prescription  Continuous  Continuous  Continuous  Continuous    Liters per minute  4  4 sometimes will titrate  to 5L  4  4    Compliance with Home Oxygen Use  -  Yes  Yes  Yes      Goals/Expected Outcomes   Short Term Goals  To learn and understand importance of monitoring SPO2 with pulse oximeter and demonstrate accurate use of the pulse oximeter.;To learn and demonstrate proper pursed lip breathing techniques or other breathing techniques.;To learn and understand importance of maintaining oxygen saturations>88%;To learn and demonstrate proper use of respiratory medications;To learn and exhibit compliance with exercise, home and travel O2 prescription  To learn and understand importance of monitoring SPO2 with pulse oximeter and demonstrate accurate use of the pulse oximeter.;To learn and demonstrate proper pursed lip breathing techniques or other breathing techniques.;To learn and understand importance of maintaining oxygen saturations>88%;To learn and demonstrate proper use of respiratory medications;To learn and exhibit compliance with exercise, home and travel O2 prescription  To learn and demonstrate proper use of respiratory medications;To learn and exhibit compliance with exercise, home and travel O2 prescription  To learn and demonstrate proper use of respiratory medications;To learn and exhibit compliance with exercise, home and travel O2 prescription;To learn and understand importance of maintaining oxygen saturations>88%;To learn and understand importance of monitoring SPO2 with pulse oximeter and demonstrate accurate use of the pulse oximeter.;To learn and demonstrate proper pursed lip breathing techniques or other breathing techniques.    Long  Term Goals  Exhibits compliance with exercise, home and travel O2  prescription;Verbalizes importance of monitoring SPO2 with pulse oximeter and return demonstration;Maintenance of O2 saturations>88%;Exhibits proper breathing techniques, such as pursed lip breathing or other method taught during program session;Compliance with respiratory medication;Demonstrates proper use of MDI's  Exhibits compliance with exercise, home and travel O2 prescription;Verbalizes importance of monitoring SPO2 with pulse oximeter and return demonstration;Maintenance of O2 saturations>88%;Exhibits proper breathing techniques, such as pursed lip breathing or other method taught during program session;Compliance with respiratory medication;Demonstrates proper use of MDI's  Compliance with respiratory medication;Maintenance of O2 saturations>88%;Exhibits compliance with exercise, home and travel O2 prescription  Compliance with respiratory medication;Maintenance of O2 saturations>88%;Exhibits compliance with exercise, home and travel O2 prescription;Verbalizes importance of monitoring SPO2 with pulse oximeter and return demonstration;Exhibits proper breathing techniques, such as pursed lip breathing or other method taught during program session    Comments  Reviewed PLB technique with pt.  Talked about how it work and it's important to maintaining his exercise saturations.    Lafern has been doing well and staying compliant with her oxygen therapy.  She is doing well on her oxygen and still getting used to using it constantly.  She has been using her PLB regualrly and finds helpful.   Patient has been doing well in the program and wants to continue exercise after she is done. She checks her oxygen at home and uses PLB. Her oxygen is on 4 liters when exercising and on 6 liters when using the treadmill. She has been getting around the house a little easier since she has been exercising. She has not had a sleep study done before but wears 3 liters at night. Informed her to check her oxygen at night if she  wakes up from sleep to make sure her oxygen is staying above 88 percent.  Alix has been compliant with her oxygen therapy.  She is still using her PLB with more of her acitities.  Her husband is very supportive in helping her with her oxygen. She uses her inhaler as needed and is doing well with everything.  Goals/Expected Outcomes  Short: Become more profiecient at using PLB.   Long: Become independent at using PLB.  Short: Continue to use PLB and stay compliant with oxygen.  Long: Continue to be compliant.   Short: check oxygen at night for sleep. Long: maintain monitoring oxygen to stay above 88 percent.  Short: Continue to work on PLB.  Long: Continue to stay compliant with oxygen therapy.        Oxygen Discharge (Final Oxygen Re-Evaluation): Oxygen Re-Evaluation - 02/19/18 1037      Program Oxygen Prescription   Program Oxygen Prescription  Continuous    Liters per minute  4   6 liters on treadmill     Home Oxygen   Home Oxygen Device  Home Concentrator;E-Tanks    Sleep Oxygen Prescription  Continuous    Liters per minute  3    Home Exercise Oxygen Prescription  Continuous    Liters per minute  4   sometimes will increase to 5L   Home at Rest Exercise Oxygen Prescription  Continuous    Liters per minute  4    Compliance with Home Oxygen Use  Yes      Goals/Expected Outcomes   Short Term Goals  To learn and demonstrate proper use of respiratory medications;To learn and exhibit compliance with exercise, home and travel O2 prescription;To learn and understand importance of maintaining oxygen saturations>88%;To learn and understand importance of monitoring SPO2 with pulse oximeter and demonstrate accurate use of the pulse oximeter.;To learn and demonstrate proper pursed lip breathing techniques or other breathing techniques.    Long  Term Goals  Compliance with respiratory medication;Maintenance of O2 saturations>88%;Exhibits compliance with exercise, home and travel O2  prescription;Verbalizes importance of monitoring SPO2 with pulse oximeter and return demonstration;Exhibits proper breathing techniques, such as pursed lip breathing or other method taught during program session    Comments  Ivianna has been compliant with her oxygen therapy.  She is still using her PLB with more of her acitities.  Her husband is very supportive in helping her with her oxygen. She uses her inhaler as needed and is doing well with everything.      Goals/Expected Outcomes  Short: Continue to work on PLB.  Long: Continue to stay compliant with oxygen therapy.        Initial Exercise Prescription: Initial Exercise Prescription - 12/22/17 1400      Date of Initial Exercise RX and Referring Provider   Date  12/22/17    Referring Provider  Ramonita Lab MD      Oxygen   Oxygen  Continuous    Liters  4-6      Treadmill   MPH  1.2    Grade  0.5    Minutes  15    METs  2      NuStep   Level  4    SPM  80    Minutes  15    METs  2      Biostep-RELP   Level  4    SPM  50    Minutes  15    METs  2      Prescription Details   Frequency (times per week)  3    Duration  Progress to 45 minutes of aerobic exercise without signs/symptoms of physical distress      Intensity   THRR 40-80% of Max Heartrate  96-125    Ratings of Perceived Exertion  11-13    Perceived Dyspnea  0-4  Progression   Progression  Continue to progress workloads to maintain intensity without signs/symptoms of physical distress.      Resistance Training   Training Prescription  Yes    Weight  2 lbs    Reps  10-15       Perform Capillary Blood Glucose checks as needed.  Exercise Prescription Changes: Exercise Prescription Changes    Row Name 12/22/17 1400 12/31/17 1000 01/08/18 1100 01/13/18 1500 01/28/18 1000     Response to Exercise   Blood Pressure (Admit)  124/62  116/60  116/60  136/78  120/62   Blood Pressure (Exercise)  128/70  130/70  130/70  -  -   Blood Pressure (Exit)   122/66  120/66  120/66  126/72  120/56   Heart Rate (Admit)  68 bpm  83 bpm  83 bpm  74 bpm  76 bpm   Heart Rate (Exercise)  89 bpm  74 bpm  74 bpm  76 bpm  78 bpm   Heart Rate (Exit)  69 bpm  62 bpm  62 bpm  75 bpm  69 bpm   Oxygen Saturation (Admit)  92 %  89 %  89 %  92 %  94 %   Oxygen Saturation (Exercise)  83 %  89 %  89 %  87 %  87 %   Oxygen Saturation (Exit)  90 %  96 %  96 %  97 %  90 %   Rating of Perceived Exertion (Exercise)  '12  13  13  13  13   '$ Perceived Dyspnea (Exercise)  '2  2  2  2  3   '$ Symptoms  SOB  -  -  -  -   Comments  walk test results  4th session in pulm  4th session in pulm  -  -   Duration  -  Progress to 45 minutes of aerobic exercise without signs/symptoms of physical distress  Progress to 45 minutes of aerobic exercise without signs/symptoms of physical distress  Continue with 45 min of aerobic exercise without signs/symptoms of physical distress.  Continue with 45 min of aerobic exercise without signs/symptoms of physical distress.   Intensity  -  THRR unchanged  THRR unchanged  THRR unchanged reaching RPE goals  THRR unchanged     Progression   Progression  -  -  -  Continue to progress workloads to maintain intensity without signs/symptoms of physical distress.  Continue to progress workloads to maintain intensity without signs/symptoms of physical distress.   Average METs  -  -  -  1.8  1.7     Resistance Training   Training Prescription  -  Yes  Yes  Yes  Yes   Weight  -  2 lb  2 lb  2 lb  2 lb   Reps  -  10-15  10-15  10-15  10-15     Interval Training   Interval Training  -  -  -  No  No     Oxygen   Oxygen  -  Continuous  Continuous  Continuous  Continuous   Liters  -  4-6  4-6  4-6  6     Treadmill   MPH  -  1.2  1.2  1.2  1.2   Grade  -  0.5  0.5  0.5  0.5   Minutes  -  '15  15  15  15   '$ METs  -  2  $'2  2  2     'E$ NuStep   Level  -  -  -  4  4   SPM  -  -  -  80  80   Minutes  -  -  -  15  15   METs  -  -  -  1.6  1.4     Arm Ergometer    Level  -  1  1  -  -   Minutes  -  15  15  -  -     Home Exercise Plan   Plans to continue exercise at  -  -  Home (comment) walks at home due to needing O2  -  -   Frequency  -  -  Add 2 additional days to program exercise sessions.  -  -   Initial Home Exercises Provided  -  -  01/08/18  -  -   [place] Name 02/11/18 1100 02/24/18 1200           Response to Exercise   Blood Pressure (Admit)  126/72  122/56      Blood Pressure (Exit)  118/58  114/50      Heart Rate (Admit)  80 bpm  71 bpm      Heart Rate (Exercise)  76 bpm  85 bpm      Heart Rate (Exit)  70 bpm  69 bpm      Oxygen Saturation (Admit)  92 %  96 %      Oxygen Saturation (Exercise)  89 %  84 %      Oxygen Saturation (Exit)  99 %  94 %      Rating of Perceived Exertion (Exercise)  12  12      Perceived Dyspnea (Exercise)  2  3      Duration  Continue with 45 min of aerobic exercise without signs/symptoms of physical distress.  Continue with 45 min of aerobic exercise without signs/symptoms of physical distress.      Intensity  THRR unchanged  THRR unchanged        Progression   Progression  Continue to progress workloads to maintain intensity without signs/symptoms of physical distress.  Continue to progress workloads to maintain intensity without signs/symptoms of physical distress.      Average METs  1.9  2        Resistance Training   Training Prescription  Yes  Yes      Weight  2 lb  2 lb      Reps  10-15  10-15        Interval Training   Interval Training  No  No        Oxygen   Oxygen  Continuous  Continuous      Liters  6   6        Treadmill   MPH  1.2  1.2      Grade  0.5  0.5      Minutes  15  15      METs  2  2        NuStep   Level  4  4      SPM  80  80      Minutes  15  15      METs  1.8  1.9         Exercise Comments: Exercise Comments    Row Name 12/25/17 1016  Exercise Comments  First full day of exercise!  Patient was oriented to gym and equipment including functions,  settings, policies, and procedures.  Patient's individual exercise prescription and treatment plan were reviewed.  All starting workloads were established based on the results of the 6 minute walk test done at initial orientation visit.  The plan for exercise progression was also introduced and progression will be customized based on patient's performance and goals.          Exercise Goals and Review: Exercise Goals    Row Name 12/22/17 1433             Exercise Goals   Increase Physical Activity  Yes       Intervention  Provide advice, education, support and counseling about physical activity/exercise needs.;Develop an individualized exercise prescription for aerobic and resistive training based on initial evaluation findings, risk stratification, comorbidities and participant's personal goals.       Expected Outcomes  Short Term: Attend rehab on a regular basis to increase amount of physical activity.;Long Term: Add in home exercise to make exercise part of routine and to increase amount of physical activity.;Long Term: Exercising regularly at least 3-5 days a week.       Increase Strength and Stamina  Yes       Intervention  Provide advice, education, support and counseling about physical activity/exercise needs.;Develop an individualized exercise prescription for aerobic and resistive training based on initial evaluation findings, risk stratification, comorbidities and participant's personal goals.       Expected Outcomes  Short Term: Increase workloads from initial exercise prescription for resistance, speed, and METs.;Short Term: Perform resistance training exercises routinely during rehab and add in resistance training at home;Long Term: Improve cardiorespiratory fitness, muscular endurance and strength as measured by increased METs and functional capacity (6MWT)       Able to understand and use rate of perceived exertion (RPE) scale  Yes       Intervention  Provide education and  explanation on how to use RPE scale       Expected Outcomes  Long Term:  Able to use RPE to guide intensity level when exercising independently;Short Term: Able to use RPE daily in rehab to express subjective intensity level       Able to understand and use Dyspnea scale  Yes       Intervention  Provide education and explanation on how to use Dyspnea scale       Expected Outcomes  Short Term: Able to use Dyspnea scale daily in rehab to express subjective sense of shortness of breath during exertion;Long Term: Able to use Dyspnea scale to guide intensity level when exercising independently       Knowledge and understanding of Target Heart Rate Range (THRR)  Yes       Intervention  Provide education and explanation of THRR including how the numbers were predicted and where they are located for reference       Expected Outcomes  Short Term: Able to state/look up THRR;Long Term: Able to use THRR to govern intensity when exercising independently;Short Term: Able to use daily as guideline for intensity in rehab       Able to check pulse independently  Yes       Intervention  Provide education and demonstration on how to check pulse in carotid and radial arteries.;Review the importance of being able to check your own pulse for safety during independent exercise       Expected Outcomes  Short Term: Able to explain why pulse checking is important during independent exercise;Long Term: Able to check pulse independently and accurately       Understanding of Exercise Prescription  Yes       Intervention  Provide education, explanation, and written materials on patient's individual exercise prescription       Expected Outcomes  Short Term: Able to explain program exercise prescription;Long Term: Able to explain home exercise prescription to exercise independently          Exercise Goals Re-Evaluation : Exercise Goals Re-Evaluation    Row Name 12/25/17 1017 12/31/17 1044 01/08/18 1139 01/13/18 1505 01/22/18  1049     Exercise Goal Re-Evaluation   Exercise Goals Review  Able to understand and use Dyspnea scale;Knowledge and understanding of Target Heart Rate Range (THRR);Understanding of Exercise Prescription;Able to understand and use rate of perceived exertion (RPE) scale  Increase Physical Activity;Increase Strength and Stamina;Able to understand and use rate of perceived exertion (RPE) scale;Able to understand and use Dyspnea scale  Increase Physical Activity;Increase Strength and Stamina;Able to understand and use rate of perceived exertion (RPE) scale;Able to understand and use Dyspnea scale;Knowledge and understanding of Target Heart Rate Range (THRR);Able to check pulse independently;Understanding of Exercise Prescription  Increase Physical Activity;Increase Strength and Stamina;Able to understand and use rate of perceived exertion (RPE) scale;Able to understand and use Dyspnea scale  Increase Physical Activity;Increase Strength and Stamina;Understanding of Exercise Prescription   Comments  Reviewed RPE scale, THR and program prescription with pt today.  Pt voiced understanding and was given a copy of goals to take home.   Avianah has transitioned from CARE to [facility]  Staff are working with her to have her try different equipment to improve her fitness.  She did not like the Biostep and so tried Arm Crank.  Staff will monitor progress.  Reviewed home exercise with pt today.  Pt plans to walk for exercise.  Reviewed THR, pulse, RPE, sign and symptoms, NTG use, and when to call 911 or MD.  Also discussed weather considerations and indoor options.  Pt voiced understanding.  Audianna prefers TM to [employer] so she is doing TM for 30 min.  Her RPE indicates she is working somewhat hard while HR does not reach target goals.  Shawniece is doing well in rehab.  We have finally found three pieces that work for her.  She is considering joining [employer] after graduation.  She has been walking in the house and doing her weights  at home.  She ususally will walk for 10mn at home.  We talked about increase time of walking to 315m.     Expected Outcomes  Short: Use RPE daily to regulate intensity. Long: Follow program prescription in THR.  Short - continue to attend and try different machines Long - maintain or improve current MET level  Short - Gladies will continue to walk at home on days not at class [place] maintain current fitness level  Short - Continue to attend regularly Long - Maintain current level of fitness  Short: Increase exercise time at home to 3019m  Long: Continue to improve strength and stamina and activity level.     [place] 01/28/18 1008 02/11/18 1115 02/19/18 1029 02/24/18 1245       Exercise Goal Re-Evaluation   Exercise Goals Review  Increase Physical Activity;Increase Strength and Stamina;Able to understand and use rate of perceived exertion (RPE) scale;Able to understand and use Dyspnea scale  Increase Physical Activity;Increase  Strength and Stamina;Able to understand and use rate of perceived exertion (RPE) scale;Able to understand and use Dyspnea scale  Increase Physical Activity;Increase Strength and Stamina;Understanding of Exercise Prescription  Increase Physical Activity;Increase Strength and Stamina;Able to understand and use rate of perceived exertion (RPE) scale;Able to understand and use Dyspnea scale    Comments  Ryelle is maintaining her MET level. She attends consistently.    Jeslyn has made slight improvement in METS on NS.  She does most work she can while maintaining 02 sats above 88.  Evola is doing well in rehab.  She has been walking at home in the house for about 10-29mn at a time.  We talked about increasing her time to 30 min.  Her strength has been about the same.   She is planning to do [employer] when she graduateds  Nyjah attends consistently.   Her goal at this time is to maintain current fitness and continue in [place] she completes LW.    Expected  Outcomes  Short - continue to exercise regularly Long - maintain current MET level   Short - continue to attend 3 days per week Long - maintain exercise on her own in [place] Continue to increase time of exercise at home.  Long: Continue to exercise independently and in [facility]   Short - continue with current work levels Long - maintain current fitness        Discharge Exercise Prescription (Final Exercise Prescription Changes): Exercise Prescription Changes - 02/24/18 1200      Response to Exercise   Blood Pressure (Admit)  122/56    Blood Pressure (Exit)  114/50    Heart Rate (Admit)  71 bpm    Heart Rate (Exercise)  85 bpm    Heart Rate (Exit)  69 bpm    Oxygen Saturation (Admit)  96 %    Oxygen Saturation (Exercise)  84 %    Oxygen Saturation (Exit)  94 %    Rating of Perceived Exertion (Exercise)  12    Perceived Dyspnea (Exercise)  3    Duration  Continue with 45 min of aerobic exercise without signs/symptoms of physical distress.    Intensity  THRR unchanged      Progression   Progression  Continue to progress workloads to maintain intensity without signs/symptoms of physical distress.    Average METs  2      Resistance Training   Training Prescription  Yes    Weight  2 lb    Reps  10-15      Interval Training   Interval Training  No      Oxygen   Oxygen  Continuous    Liters  6      Treadmill   MPH  1.2    Grade  0.5    Minutes  15    METs  2      NuStep   Level  4    SPM  80    Minutes  15    METs  1.9       Nutrition:  Target Goals: Understanding of nutrition guidelines, daily intake of sodium '1500mg'$ , cholesterol '200mg'$ , calories 30% from fat and 7% or less from saturated fats, daily to have 5 or more servings of fruits and vegetables.  Biometrics: Pre Biometrics - 12/22/17 1434      Pre Biometrics   Height  4' 11.2" (1.504 m)    Weight  99 lb 4.8 oz (45  kg)    Waist Circumference  27 inches    Hip Circumference  35.5 inches     Waist to Hip Ratio  0.76 %    BMI (Calculated)  19.91    Single Leg Stand  2.43 seconds        Nutrition Therapy Plan and Nutrition Goals: Nutrition Therapy & Goals - 12/28/17 1037      Nutrition Therapy   Diet  TLC    Protein (specify units)  6oz    Fiber  20 grams    Whole Grain Foods  3 servings    Saturated Fats  12 max. grams    Fruits and Vegetables  5 servings/day   8 ideal, eats small portions   Sodium  1500 grams      Personal Nutrition Goals   Nutrition Goal  Drink Boost nutritional shakes at least once per day to help meet calorie and protein needs. You can also look for the Ensure high protein variety    Personal Goal #2  Be consistent about consuming an evening snack, along with regular meals/ snacks throughout the day. Eating on a consistent schedule will help prevent wt loss    Personal Goal #3  Continue to include sources of calorie-dense foods such as fats in your diet regularly. Since you like nuts, these would make a great snack    Comments  She tries to eat as much as she can at meal times but still struggles to meet daily energy needs. She has been eating dried fruit, nuts, and some protein shakes/ snacks      Intervention Plan   Intervention  Prescribe, educate and counsel regarding individualized specific dietary modifications aiming towards targeted core components such as weight, hypertension, lipid management, diabetes, heart failure and other comorbidities.    Expected Outcomes  Short Term Goal: Understand basic principles of dietary content, such as calories, fat, sodium, cholesterol and nutrients.;Short Term Goal: A plan has been developed with personal nutrition goals set during dietitian appointment.;Long Term Goal: Adherence to prescribed nutrition plan.       Nutrition Assessments: Nutrition Assessments - 12/22/17 1229      MEDFICTS Scores   Pre Score  13       Nutrition Goals Re-Evaluation: Nutrition Goals Re-Evaluation    [place] Name 12/28/17  1054 02/03/18 1045 02/22/18 1049         Goals   Nutrition Goal  Be consistent about consuming an evening snack, along with regular meals/ snacks throughout the day. Eating on a consistent schedule will help prevent wt loss  Be consistent about consuming an evening snack and with eating on a regular schedule throughout the day. Include calorically-dense foods and nutritional suppelment drinks like Boost daily  Eat on a regular schedule and be more consistent about eating an evening snack; drink Boost nutritional drinks at least once per day; continue to include sources of calorie-dense foods such as fats to help maintain weight     Comment  She c/o struggling to eat enough at meal times despite trying to eat as much as she can  She is trying to include evening snacks but does not eat one every day. Lately she has been eating a banana '@HS'$ . She tries to eat regularly throughout the day and drinks Boost shakes, but not daily and typically only half or 3/4.  She has been trying to be more consistent about eating snacks between meals. She bought granola to eat and put on yogurt which she  has been enjoying. She continues to drink Boost semi-regularly     Expected Outcome  She will eat smaller, more frequent meals in an attempt to meet daily nutritional needs. Emphasize protein and fats  She will try cottage cheese as another evening snack option, and she will make more of an effort to drink 100% of Boost supplement and consume 1 every day rather than every few days in order to maintain/gain weight  Maintain CBW +/- 5#, continue to eat meals and snacks consistently and to drink Boost nutritional drinks to supplement food intake in order to maintain wt       Personal Goal #2 Re-Evaluation   Personal Goal #2  Drink Boost nutritional shakes at least once per day to help meet calorie and protein needs. You can also look for the Ensure high protein variety  -  -       Personal Goal #3 Re-Evaluation   Personal Goal  #3  Continue to include sources of calorie-dense foods such as fats in your diet regularly. Since you like nuts, these would make a great snack  -  -        Nutrition Goals Discharge (Final Nutrition Goals Re-Evaluation): Nutrition Goals Re-Evaluation - 02/22/18 1049      Goals   Nutrition Goal  Eat on a regular schedule and be more consistent about eating an evening snack; drink Boost nutritional drinks at least once per day; continue to include sources of calorie-dense foods such as fats to help maintain weight    Comment  She has been trying to be more consistent about eating snacks between meals. She bought granola to eat and put on yogurt which she has been enjoying. She continues to drink Boost semi-regularly    Expected Outcome  Maintain CBW +/- 5#, continue to eat meals and snacks consistently and to drink Boost nutritional drinks to supplement food intake in order to maintain wt       Psychosocial: Target Goals: Acknowledge presence or absence of significant depression and/or stress, maximize coping skills, provide positive support system. Participant is able to verbalize types and ability to use techniques and skills needed for reducing stress and depression.   Initial Review & Psychosocial Screening: Initial Psych Review & Screening - 12/22/17 1304      Initial Review   Current issues with  Current Stress Concerns    Source of Stress Concerns  Chronic Illness    Comments  Patient requires more oxygen than the last time she was in [place].      Family Dynamics   Good Support System?  Yes    Comments  Her husband is a good support system. Most of her family is in [place].      Barriers   Psychosocial barriers to participate in program  The patient should benefit from training in stress management and relaxation.      Screening Interventions   Interventions  Encouraged to exercise;Program counselor consult;Provide feedback about the scores to participant;To provide  support and resources with identified psychosocial needs    Expected Outcomes  Short Term goal: Identification and review with participant of any Quality of Life or Depression concerns found by scoring the questionnaire.;Long Term goal: The participant improves quality of Life and PHQ9 Scores as seen by post scores and/or verbalization of changes;Long Term Goal: Stressors or current issues are controlled or eliminated.;Short Term goal: Utilizing psychosocial counselor, staff and physician to assist with identification of specific Stressors or current issues interfering with  healing process. Setting desired goal for each stressor or current issue identified.       Quality of Life Scores:  Scores of 19 and below usually indicate a poorer quality of life in these areas.  A difference of  2-3 points is a clinically meaningful difference.  A difference of 2-3 points in the total score of the Quality of Life Index has been associated with significant improvement in overall quality of life, self-image, physical symptoms, and general health in studies assessing change in quality of life.  PHQ-9: Recent Review Flowsheet Data    Depression screen [facility] 2/9 12/22/2017 07/22/2017 06/24/2017 05/05/2017 04/30/2017   Decreased Interest '1 1 1 3 '$ 0   Down, Depressed, Hopeless '1 1 1 '$ 0 0   PHQ - 2 Score '2 2 2 3 '$ 0   Altered sleeping '1 3 1 3 '$ -   Tired, decreased energy '1 2 2 3 '$ -   Change in appetite 1 1 0 0 -   Feeling bad or failure about yourself  0 0 0 0 -   Trouble concentrating 0 0 0 0 -   Moving slowly or fidgety/restless 1 0 0 0 -   Suicidal thoughts 0 0 0 0 -   PHQ-9 Score '6 8 5 9 '$ -   Difficult doing work/chores Somewhat difficult - Somewhat difficult Very difficult -     Interpretation of Total Score  Total Score Depression Severity:  1-4 = Minimal depression, 5-9 = Mild depression, 10-14 = Moderate depression, 15-19 = Moderately severe depression, 20-27 = Severe depression   Psychosocial Evaluation and  Intervention: Psychosocial Evaluation - 12/28/17 1113      Psychosocial Evaluation & Interventions   Interventions  Relaxation education    Comments  Counselor met with Ms. Brigitte Pulse [facility] for a new intake psychosocial evaluation.  She has returned after several months of being out of the program and completed the CARE program in the interim.  She continues to have a strong support system and no new health diagnoses.  She reports sleeping well and takes natural supplements to help with this.  She states she's a little "dumpy" on occasion meaning sometimes a little "down in the dumps" with her mood but reports she is on medication to help with this, although counselor did not see anything in her medication list for mood.  Araceli states her health is the most stressful thing for her and having to be on oxygen 24/7.  She has goals to improve her breathing and stamina while in this program.      Expected Outcomes  Short:  Mckaylee will exercise consistently and attend the educational components to learn positive ways to manage her condition.   Long:  Sansa will develop relaxation skills to help reduce the amount of oxygen needed.  She will continue to exercise and practice positive self-care.     Continue Psychosocial Services   Follow up required by staff       Psychosocial Re-Evaluation: Psychosocial Re-Evaluation    Mariano Colon Name 01/22/18 1054 02/19/18 1031           Psychosocial Re-Evaluation   Current issues with  Current Stress Concerns;Current Sleep Concerns  Current Stress Concerns;Current Sleep Concerns      Comments  Inell has been doing well.  She admits to having up and down.  She is still getting used to being on her oxygen and the lifestyle change that it has brought on.  She would rather use then not here at  all.  She is sitll taking Melatonin at night and it continues to work for her.    Merriam has been doing well.  She has occasional moments of feeling down especially being on oxygen 24/7.  She is coping with her new normal.  She is still taking the melatonin at night and sleeping good.        Expected Outcomes  Short: Continue to practice her self care and come to class.  Long: Continue to stay postive.   Short: Continue to attend rehab regularly.  Long: Continue to cope postively          Psychosocial Discharge (Final Psychosocial Re-Evaluation): Psychosocial Re-Evaluation - 02/19/18 1031      Psychosocial Re-Evaluation   Current issues with  Current Stress Concerns;Current Sleep Concerns    Comments  Yarely has been doing well.  She has occasional moments of feeling down especially being on oxygen 24/7. She is coping with her new normal.  She is still taking the melatonin at night and sleeping good.      Expected Outcomes  Short: Continue to attend rehab regularly.  Long: Continue to cope postively        Education: Education Goals: Education classes will be provided on a weekly basis, covering required topics. Participant will state understanding/return demonstration of topics presented.  Learning Barriers/Preferences: Learning Barriers/Preferences - 12/22/17 1228      Learning Barriers/Preferences   Learning Barriers  Sight   wears glasses   Learning Preferences  None       Education Topics:  Initial Evaluation Education: - Verbal, written and demonstration of respiratory meds, oximetry and breathing techniques. Instruction on use of nebulizers and MDIs and importance of monitoring MDI activations.   Pulmonary Rehab from 03/03/2018 in [facility] Cardiac and Pulmonary Rehab  Date  12/22/17  Educator  [facility]  Instruction Review Code  1- Verbalizes Understanding      General Nutrition Guidelines/Fats and Fiber: -Group instruction provided by verbal, written material, models and posters to present the general guidelines for heart healthy nutrition. Gives an explanation and review of dietary fats and fiber.   Pulmonary Rehab from 03/03/2018 in [facility] Cardiac and Pulmonary  Rehab  Date  01/27/18  Educator  LB  Instruction Review Code  -- [unable to stay due to appointment]      Controlling Sodium/Reading Food Labels: -Group verbal and written material supporting the discussion of sodium use in heart healthy nutrition. Review and explanation with models, verbal and written materials for utilization of the food label.   Pulmonary Rehab from 03/03/2018 in [facility] Cardiac and Pulmonary Rehab  Date  02/03/18  Educator  LB  Instruction Review Code  1- Verbalizes Understanding      Exercise Physiology & General Exercise Guidelines: - Group verbal and written instruction with models to review the exercise physiology of the cardiovascular system and associated critical values. Provides general exercise guidelines with specific guidelines to those with heart or lung disease.    Pulmonary Rehab from 03/03/2018 in [facility] Cardiac and Pulmonary Rehab  Date  02/10/18  Educator  [facility]  Instruction Review Code  1- Verbalizes Understanding      Aerobic Exercise & Resistance Training: - Gives group verbal and written instruction on the various components of exercise. Focuses on aerobic and resistive training programs and the benefits of this training and how to safely progress through these programs.   Pulmonary Rehab from 03/03/2018 in [facility] Cardiac and Pulmonary Rehab  Date  02/24/18  Educator  [facility]  Instruction Review Code  1- Geologist, engineering, Balance, Mind/Body Relaxation: Provides group verbal/written instruction on the benefits of flexibility and balance training, including mind/body exercise modes such as yoga, pilates and tai chi.  Demonstration and skill practice provided.   Pulmonary Rehab from 07/29/2017 in [facility] Cardiac and Pulmonary Rehab  Date  07/01/17  Educator  AS  Instruction Review Code  1- Verbalizes Understanding      Stress and Anxiety: - Provides group verbal and written instruction about the health risks of elevated stress  and causes of high stress.  Discuss the correlation between heart/lung disease and anxiety and treatment options. Review healthy ways to manage with stress and anxiety.   Pulmonary Rehab from 07/29/2017 in [facility] Cardiac and Pulmonary Rehab  Date  07/22/17  Educator  [facility]  Instruction Review Code  1- Verbalizes Understanding      Depression: - Provides group verbal and written instruction on the correlation between heart/lung disease and depressed mood, treatment options, and the stigmas associated with seeking treatment.   Pulmonary Rehab from 03/03/2018 in [facility] Cardiac and Pulmonary Rehab  Date  03/03/18  Educator  [facility]  Instruction Review Code  1- Verbalizes Understanding      Exercise & Equipment Safety: - Individual verbal instruction and demonstration of equipment use and safety with use of the equipment.   Pulmonary Rehab from 03/03/2018 in [facility] Cardiac and Pulmonary Rehab  Date  12/22/17  Educator  [facility]  Instruction Review Code  1- Verbalizes Understanding      Infection Prevention: - Provides verbal and written material to individual with discussion of infection control including proper hand washing and proper equipment cleaning during exercise session.   Pulmonary Rehab from 03/03/2018 in [facility] Cardiac and Pulmonary Rehab  Date  12/22/17  Educator  [facility]  Instruction Review Code  1- Verbalizes Understanding      Falls Prevention: - Provides verbal and written material to individual with discussion of falls prevention and safety.   Pulmonary Rehab from 03/03/2018 in [facility] Cardiac and Pulmonary Rehab  Date  12/22/17  Educator  [facility]  Instruction Review Code  1- Verbalizes Understanding      Diabetes: - Individual verbal and written instruction to review signs/symptoms of diabetes, desired ranges of glucose level fasting, after meals and with exercise. Advice that pre and post exercise glucose checks will be done for 3 sessions at entry of program.   Chronic Lung Diseases: -  Group verbal and written instruction to review updates, respiratory medications, advancements in procedures and treatments. Discuss use of supplemental oxygen including available portable oxygen systems, continuous and intermittent flow rates, concentrators, personal use and safety guidelines. Review proper use of inhaler and spacers. Provide informative websites for self-education.    Pulmonary Rehab from 03/03/2018 in [facility] Cardiac and Pulmonary Rehab  Date  01/29/18  Educator  [facility]  Instruction Review Code  1- Verbalizes Understanding      Energy Conservation: - Provide group verbal and written instruction for methods to conserve energy, plan and organize activities. Instruct on pacing techniques, use of adaptive equipment and posture/positioning to relieve shortness of breath.   Pulmonary Rehab from 03/03/2018 in [facility] Cardiac and Pulmonary Rehab  Date  01/06/18  Educator  [facility]  Instruction Review Code  1- Verbalizes Understanding      Triggers and Exacerbations: - Group verbal and written instruction to review types of environmental triggers and ways to prevent exacerbations. Discuss weather changes, air quality and  the benefits of nasal washing. Review warning signs and symptoms to help prevent infections. Discuss techniques for effective airway clearance, coughing, and vibrations.   Pulmonary Rehab from 03/03/2018 in [facility] Cardiac and Pulmonary Rehab  Date  02/17/18  Educator  [facility]  Instruction Review Code  1- Verbalizes Understanding      AED/CPR: - Group verbal and written instruction with the use of models to demonstrate the basic use of the AED with the basic ABC's of resuscitation.   Pulmonary Rehab from 07/29/2017 in [facility] Cardiac and Pulmonary Rehab  Date  07/17/17  Educator  [facility]  Instruction Review Code  1- Actuary and Physiology of the Lungs: - Group verbal and written instruction with the use of models to provide basic lung anatomy and physiology  related to function, structure and complications of lung disease.   Pulmonary Rehab from 07/29/2017 in [facility] Cardiac and Pulmonary Rehab  Date  07/29/17  Educator  [facility]  Instruction Review Code  1- Verbalizes Understanding      Anatomy & Physiology of the Heart: - Group verbal and written instruction and models provide basic cardiac anatomy and physiology, with the coronary electrical and arterial systems. Review of Valvular disease and Heart Failure   Pulmonary Rehab from 03/03/2018 in [facility] Cardiac and Pulmonary Rehab  Date  02/26/18  Educator  [facility]  Instruction Review Code  1- Verbalizes Understanding      Cardiac Medications: - Group verbal and written instruction to review commonly prescribed medications for heart disease. Reviews the medication, class of the drug, and side effects.   Pulmonary Rehab from 03/03/2018 in [facility] Cardiac and Pulmonary Rehab  Date  12/30/17  Educator  [facility]  Instruction Review Code  1- Verbalizes Understanding      Know Your Numbers and Risk Factors: -Group verbal and written instruction about important numbers in your health.  Discussion of what are risk factors and how they play a role in the disease process.  Review of Cholesterol, Blood Pressure, Diabetes, and BMI and the role they play in your overall health.   Pulmonary Rehab from 03/03/2018 in [facility] Cardiac and Pulmonary Rehab  Date  02/12/18  Educator  [facility]  Instruction Review Code  1- Verbalizes Understanding      Sleep Hygiene: -Provides group verbal and written instruction about how sleep can affect your health.  Define sleep hygiene, discuss sleep cycles and impact of sleep habits. Review good sleep hygiene tips.    Pulmonary Rehab from 03/03/2018 in [facility] Cardiac and Pulmonary Rehab  Date  01/20/18  Educator  [facility]  Instruction Review Code  1- Verbalizes Understanding      Other: -Provides group and verbal instruction on various topics (see comments)   Pulmonary Rehab from 07/29/2017 in [facility]  Cardiac and Pulmonary Rehab  Date  06/10/17  Educator  [facility]  Instruction Review Code  1- Verbalizes Understanding [SLEEP]       Knowledge Questionnaire Score: Knowledge Questionnaire Score - 12/22/17 1228      Knowledge Questionnaire Score   Pre Score  16/18   reviewed with patient       Core Components/Risk Factors/Patient Goals at Admission: Personal Goals and Risk Factors at Admission - 12/22/17 1302      Core Components/Risk Factors/Patient Goals on Admission    Weight Management  Weight Maintenance;Yes    Intervention  Weight Management: Develop a combined nutrition and exercise program designed to reach desired caloric intake, while maintaining appropriate intake of  nutrient and fiber, sodium and fats, and appropriate energy expenditure required for the weight goal.;Weight Management/Obesity: Establish reasonable short term and long term weight goals.;Weight Management: Provide education and appropriate resources to help participant work on and attain dietary goals.    Admit Weight  99 lb 4.8 oz (45 kg)    Goal Weight: Long Term  100 lb (45.4 kg)    Expected Outcomes  Short Term: Continue to assess and modify interventions until short term weight is achieved;Long Term: Adherence to nutrition and physical activity/exercise program aimed toward attainment of established weight goal;Weight Maintenance: Understanding of the daily nutrition guidelines, which includes 25-35% calories from fat, 7% or less cal from saturated fats, less than '200mg'$  cholesterol, less than 1.5gm of sodium, & 5 or more servings of fruits and vegetables daily;Understanding recommendations for meals to include 15-35% energy as protein, 25-35% energy from fat, 35-60% energy from carbohydrates, less than '200mg'$  of dietary cholesterol, 20-35 gm of total fiber daily;Understanding of distribution of calorie intake throughout the day with the consumption of 4-5 meals/snacks    Improve shortness of breath with ADL's  Yes     Intervention  Provide education, individualized exercise plan and daily activity instruction to help decrease symptoms of SOB with activities of daily living.    Expected Outcomes  Short Term: Improve cardiorespiratory fitness to achieve a reduction of symptoms when performing ADLs;Long Term: Be able to perform more ADLs without symptoms or delay the onset of symptoms       Core Components/Risk Factors/Patient Goals Review:  Goals and Risk Factor Review    Row Name 01/22/18 1056 02/19/18 1033           Core Components/Risk Factors/Patient Goals Review   Personal Goals Review  Weight Management/Obesity;Improve shortness of breath with ADL's  Weight Management/Obesity;Improve shortness of breath with ADL's      Review  Eudora is doing well with her weight.  She is back up to 102lbs.  Her breathing continues to be a little moderate with exercise and she does need to exercise on a higher exercise flow.    She has been holding steady around 102 lbs.  Her breathing is still moderate with activity and finds that weight bearing activities require a higher flow.        Expected Outcomes  Short: Continue to work on weight maintenance.  Long: Continue to work on improving her breathing.   Short: Continue to work on weight maintenance. Long: Continue to work on breathing.          Core Components/Risk Factors/Patient Goals at Discharge (Final Review):  Goals and Risk Factor Review - 02/19/18 1033      Core Components/Risk Factors/Patient Goals Review   Personal Goals Review  Weight Management/Obesity;Improve shortness of breath with ADL's    Review  She has been holding steady around 102 lbs.  Her breathing is still moderate with activity and finds that weight bearing activities require a higher flow.      Expected Outcomes  Short: Continue to work on weight maintenance. Long: Continue to work on breathing.        ITP Comments: ITP Comments    Row Name 12/22/17 1217 01/11/18 0829 02/08/18 0831  03/08/18 0848     ITP Comments  Medical evaluation completed. Chart sent for review and changes to Dr. Caryl Comes for Dr. Emily Filbert director of [place]. Diagnosis can be found in [facility] encounter 12/14/17  30 day review completed. ITP sent to Dr. Emily Filbert Director  of [facility]. Continue with ITP unless changes are made by physician  30 day review completed. ITP sent to Dr. Emily Filbert Director of [place]. Continue with ITP unless changes are made by physician  30 day review completed. ITP sent to Dr. Emily Filbert Director of [place]. Continue with ITP unless changes are made by physician.       Comments: 30 day review

## 2018-03-08 NOTE — Progress Notes (Signed)
Daily Session Note  Patient Details  Name: Sherri Price MRN: [id number] Date of Birth: [birth year] Referring Provider:     Pulmonary Rehab from 12/22/2017 in [facility] Cardiac and Pulmonary Rehab  Referring Provider  Ramonita Lab MD      Encounter Date: 03/08/2018  Check In: Session Check In - 03/08/18 1023      Check-In   Supervising physician immediately available to respond to emergencies  [facility] immediately available ER MD    Physician(s)  Dr. Joni Fears and Dr. Corky Downs    Location  [facility]-Cardiac & Pulmonary Rehab    Staff Present  Justin Mend RCP,RRT,BSRT;Amanda Oletta Darter, [place], ACSM CEP, Exercise Physiologist;Jeffifer Rabold Amedeo Plenty, BS, ACSM CEP, Exercise Physiologist    Medication changes reported      No    Fall or balance concerns reported     No    Tobacco Cessation  No Change    Warm-up and Cool-down  Performed as group-led instruction    Resistance Training Performed  Yes    VAD Patient?  No    PAD/SET Patient?  No      Pain Assessment   Currently in Pain?  No/denies    Multiple Pain Sites  No          Social History   Tobacco Use  Smoking Status Never Smoker  Smokeless Tobacco Never Used    Goals Met:  Proper associated with RPD/PD & O2 Sat Independence with exercise equipment Exercise tolerated well No report of cardiac concerns or symptoms Strength training completed today  Goals Unmet:  Not Applicable  Comments: Pt able to follow exercise prescription today without complaint.  Will continue to monitor for progression.    Dr. Emily Filbert is Medical Director for [place] and [facility].

## 2018-03-10 VITALS — Ht 59.2 in | Wt 103.9 lb

## 2018-03-10 DIAGNOSIS — I272 Pulmonary hypertension, unspecified: Secondary | ICD-10-CM

## 2018-03-10 NOTE — Progress Notes (Signed)
Daily Session Note  Patient Details  Name: Sherri Price MRN: [id number] Date of Birth: 01/03/1934 Referring Provider:     Pulmonary Rehab from 12/22/2017 in [facility] Cardiac and Pulmonary Rehab  Referring Provider  Ramonita Lab MD      Encounter Date: 03/10/2018  Check In: Session Check In - 03/10/18 0948      Check-In   Supervising physician immediately available to respond to emergencies  [facility] immediately available ER MD    Physician(s)  Dr. Joni Fears and Darl Householder    Location  [facility]-Cardiac & Pulmonary Rehab    Staff Present  Justin Mend Lorre Nick, MA, RCEP, CCRP, Exercise Physiologist;Amanda Oletta Darter, [place], ACSM CEP, Exercise Physiologist    Medication changes reported      No    Fall or balance concerns reported     No    Warm-up and Cool-down  Performed as group-led instruction    Resistance Training Performed  Yes    VAD Patient?  No    PAD/SET Patient?  No      Pain Assessment   Currently in Pain?  No/denies          Social History   Tobacco Use  Smoking Status Never Smoker  Smokeless Tobacco Never Used    Goals Met:  Independence with exercise equipment Exercise tolerated well No report of cardiac concerns or symptoms Strength training completed today  Goals Unmet:  Not Applicable  Comments: Pt able to follow exercise prescription today without complaint.  Will continue to monitor for progression.  We did Gabrial's post 6MWT today, but she desaturated to 73%.  We will try again next week and will let her pull large e-cylinder to see if she can improve distance and not desaturate. [place] Name 12/22/17 1425 03/10/18 1029       6 Minute Walk   Phase  Initial  Discharge    Distance  688 feet  365 feet    Distance % Change  -  -46.9 %    Distance Feet Change  -  -323 ft    Walk Time  6 minutes  3 minutes    # of Rest Breaks  0  0 Test stopped at 3 min for desaturation    MPH  1.3  1.38    METS  1.29  0.89    RPE  12  13    Perceived Dyspnea   2  3    VO2 Peak  4.51  3.13    Symptoms  Yes (comment)  No    Comments  SOB  SOB    Resting HR  68 bpm  87 bpm    Resting BP  124/62  126/64    Resting Oxygen Saturation   92 %  92 %    Exercise Oxygen Saturation  during 6 min walk  83 %  73 %    Max Ex. HR  89 bpm  99 bpm    Max Ex. BP  128/70  144/74    2 Minute Post BP  122/66  144/70      Interval HR   1 Minute HR  80  94    2 Minute HR  82  99    3 Minute HR  83  93    4 Minute HR  82  -    5 Minute HR  81  -    6 Minute HR  89  -  2 Minute Post HR  69  -    Interval Heart Rate?  Yes  Yes      Interval Oxygen   Interval Oxygen?  Yes  Yes    Baseline Oxygen Saturation %  92 %  92 %    1 Minute Oxygen Saturation %  88 %  86 %    1 Minute Liters of Oxygen  4 L  4 L continuous (small e-cylinder)    2 Minute Oxygen Saturation %  86 % 2:26 85%  80 %    2 Minute Liters of Oxygen  4 L  4 L    3 Minute Oxygen Saturation %  85 %  73 %    3 Minute Liters of Oxygen  4 L  4 L    4 Minute Oxygen Saturation %  84 % 4:20 83%  -    4 Minute Liters of Oxygen  4 L  -    5 Minute Oxygen Saturation %  86 %  -    5 Minute Liters of Oxygen  4 L  -    6 Minute Oxygen Saturation %  85 %  -    6 Minute Liters of Oxygen  4 L  -    2 Minute Post Oxygen Saturation %  90 %  -    2 Minute Post Liters of Oxygen  4 L  -        Dr. Emily Filbert is Medical Director for [place] and [facility].

## 2018-03-12 DIAGNOSIS — I272 Pulmonary hypertension, unspecified: Secondary | ICD-10-CM | POA: Diagnosis not present

## 2018-03-12 NOTE — Progress Notes (Signed)
Daily Session Note  Patient Details  Name: Sherri Price MRN: [id number] Date of Birth: September 11, 1933 Referring Provider:     Pulmonary Rehab from 12/22/2017 in [facility] Cardiac and Pulmonary Rehab  Referring Provider  Ramonita Lab MD      Encounter Date: 03/12/2018  Check In: Session Check In - 03/12/18 0946      Check-In   Supervising physician immediately available to respond to emergencies  [facility] immediately available ER MD    Physician(s)  Dr. Cherylann Banas and Corky Downs    Location  [facility]-Cardiac & Pulmonary Rehab    Staff Present  Justin Mend RCP,RRT,BSRT;Meredith Sherryll Burger, RN BSN;Leslie Balaton RN, BSN    Medication changes reported      No    Fall or balance concerns reported     No    Warm-up and Cool-down  Performed as group-led Higher education careers adviser Performed  Yes    VAD Patient?  No    PAD/SET Patient?  No      Pain Assessment   Currently in Pain?  No/denies          Social History   Tobacco Use  Smoking Status Never Smoker  Smokeless Tobacco Never Used    Goals Met:  Independence with exercise equipment Exercise tolerated well No report of cardiac concerns or symptoms Strength training completed today  Goals Unmet:  Not Applicable  Comments: Pt able to follow exercise prescription today without complaint.  Will continue to monitor for progression.    Dr. Emily Filbert is Medical Director for [place] and [facility].

## 2018-03-15 DIAGNOSIS — I272 Pulmonary hypertension, unspecified: Secondary | ICD-10-CM

## 2018-03-15 NOTE — Progress Notes (Signed)
Daily Session Note  Patient Details  Name: Sherri Price MRN: [id number] Date of Birth: 10/23/1933 Referring Provider:     Pulmonary Rehab from 12/22/2017 in [facility] Cardiac and Pulmonary Rehab  Referring Provider  Klein, Bert MD      Encounter Date: 03/15/2018  Check In: Session Check In - 03/15/18 0947      Check-In   Supervising physician immediately available to respond to emergencies  [facility] immediately available ER MD    Physician(s)  Dr. Williams and Robinson    Location  [facility]-Cardiac & Pulmonary Rehab    Staff Present  Joseph Hood RCP,RRT,BSRT;Kelly Hayes, BS, ACSM CEP, Exercise Physiologist;Amanda Sommer, BA, ACSM CEP, Exercise Physiologist    Medication changes reported      No    Fall or balance concerns reported     No    Warm-up and Cool-down  Performed as group-led instruction    Resistance Training Performed  Yes    VAD Patient?  No    PAD/SET Patient?  No      Pain Assessment   Currently in Pain?  No/denies          Social History   Tobacco Use  Smoking Status Never Smoker  Smokeless Tobacco Never Used    Goals Met:  Independence with exercise equipment Exercise tolerated well No report of cardiac concerns or symptoms Strength training completed today  Goals Unmet:  Not Applicable  Comments: Pt able to follow exercise prescription today without complaint.  Will continue to monitor for progression.    Dr. Mark Miller is Medical Director for [facility] and [facility]. 

## 2018-03-17 ENCOUNTER — Encounter: Payer: Medicare Other | Admitting: *Deleted

## 2018-03-17 DIAGNOSIS — I272 Pulmonary hypertension, unspecified: Secondary | ICD-10-CM | POA: Diagnosis not present

## 2018-03-17 DIAGNOSIS — J8410 Pulmonary fibrosis, unspecified: Secondary | ICD-10-CM

## 2018-03-17 NOTE — Progress Notes (Signed)
Daily Session Note  Patient Details  Name: Sherri Price MRN: [id number] Date of Birth: 1933-06-25 Referring Provider:     Pulmonary Rehab from 12/22/2017 in [facility] Cardiac and Pulmonary Rehab  Referring Provider  Ramonita Lab MD      Encounter Date: 03/17/2018  Check In: Session Check In - 03/17/18 1040      Check-In   Supervising physician immediately available to respond to emergencies  [facility] immediately available ER MD    Physician(s)  Drs. Lord and [employer]    Location  [facility]-Cardiac & Pulmonary Rehab    Staff Present  Alberteen Sam, MA, RCEP, CCRP, Exercise Physiologist;Joseph [employer], [place], ACSM CEP, Exercise Physiologist    Medication changes reported      No    Fall or balance concerns reported     No    Warm-up and Cool-down  Performed as group-led instruction    Resistance Training Performed  Yes    VAD Patient?  No    PAD/SET Patient?  No      Pain Assessment   Currently in Pain?  No/denies          Social History   Tobacco Use  Smoking Status Never Smoker  Smokeless Tobacco Never Used    Goals Met:  Independence with exercise equipment Exercise tolerated well No report of cardiac concerns or symptoms Strength training completed today  Goals Unmet:  Not Applicable  Comments: Pt able to follow exercise prescription today without complaint.  Will continue to monitor for progression. [name] Name 12/22/17 1425 03/10/18 1029 03/17/18 1041     6 Minute Walk   Phase  Initial  Discharge  Discharge   Distance  688 feet  365 feet  750 feet   Distance % Change  -  -46.9 %  9 %   Distance Feet Change  -  -323 ft  62 ft   Walk Time  6 minutes  3 minutes  6 minutes   # of Rest Breaks  0  0 Test stopped at 3 min for desaturation  0   MPH  1.3  1.38  1.42   METS  1.29  0.89  1.5   RPE  _0 Perceived Dyspnea   _1 VO2 Peak  4.51  3.13  5.26   Symptoms  Yes (comment)  No  Yes (comment)   Comments  SOB   SOB  SOB   Resting HR  68 bpm  87 bpm  90 bpm   Resting BP  124/62  126/64  116/60   Resting Oxygen Saturation   92 %  92 %  93 %   Exercise Oxygen Saturation  during 6 min walk  83 %  73 %  83 %   Max Ex. HR  89 bpm  99 bpm  93 bpm   Max Ex. BP  128/70  144/74  152/78   2 Minute Post BP  122/66  144/70  146/70     Interval HR   1 Minute HR  80  94  79   2 Minute HR  82  99  85   3 Minute HR  83  93  83   4 Minute HR  82  -  93   5 Minute HR  81  -  93   6 Minute HR  89  -  83  2 Minute Post HR  69  -  72   Interval Heart Rate?  Yes  Yes  Yes     Interval Oxygen   Interval Oxygen?  Yes  Yes  Yes   Baseline Oxygen Saturation %  92 %  92 %  93 %   1 Minute Oxygen Saturation %  88 %  86 %  95 %   1 Minute Liters of Oxygen  4 L  4 L continuous (small e-cylinder)  6 L CONTINUOS E CYLINDER   2 Minute Oxygen Saturation %  86 % 2:26 85%  80 %  93 %   2 Minute Liters of Oxygen  4 L  4 L  6 L   3 Minute Oxygen Saturation %  85 %  73 %  93 %   3 Minute Liters of Oxygen  4 L  4 L  6 L   4 Minute Oxygen Saturation %  84 % 4:20 83%  -  87 %   4 Minute Liters of Oxygen  4 L  -  6 L   5 Minute Oxygen Saturation %  86 %  -  86 %   5 Minute Liters of Oxygen  4 L  -  6 L   6 Minute Oxygen Saturation %  85 %  -  83 %   6 Minute Liters of Oxygen  4 L  -  6 L   2 Minute Post Oxygen Saturation %  90 %  -  94 %   2 Minute Post Liters of Oxygen  4 L  -  6 L        Dr. Emily Filbert is Medical Director for [place] and [facility].

## 2018-03-19 ENCOUNTER — Encounter: Payer: Medicare Other | Attending: Internal Medicine

## 2018-03-19 DIAGNOSIS — J8410 Pulmonary fibrosis, unspecified: Secondary | ICD-10-CM | POA: Diagnosis not present

## 2018-03-19 DIAGNOSIS — I272 Pulmonary hypertension, unspecified: Secondary | ICD-10-CM | POA: Insufficient documentation

## 2018-03-19 DIAGNOSIS — K219 Gastro-esophageal reflux disease without esophagitis: Secondary | ICD-10-CM | POA: Diagnosis not present

## 2018-03-19 DIAGNOSIS — R0602 Shortness of breath: Secondary | ICD-10-CM | POA: Insufficient documentation

## 2018-03-19 DIAGNOSIS — Z79899 Other long term (current) drug therapy: Secondary | ICD-10-CM | POA: Diagnosis not present

## 2018-03-19 DIAGNOSIS — E039 Hypothyroidism, unspecified: Secondary | ICD-10-CM | POA: Diagnosis not present

## 2018-03-19 DIAGNOSIS — Z79890 Hormone replacement therapy (postmenopausal): Secondary | ICD-10-CM | POA: Insufficient documentation

## 2018-03-19 NOTE — Progress Notes (Signed)
Daily Session Note  Patient Details  Name: Sherri Price MRN: [id number] Date of Birth: [birth year] Referring Provider:     Pulmonary Rehab from 12/22/2017 in [facility] Cardiac and Pulmonary Rehab  Referring Provider  Ramonita Lab MD      Encounter Date: 03/19/2018  Check In: Session Check In - 03/19/18 0959      Check-In   Supervising physician immediately available to respond to emergencies  [facility] immediately available ER MD    Physician(s)  Dr. Cherylann Banas    Location  [facility]-Cardiac & Pulmonary Rehab    Staff Present  Alberteen Sam, MA, RCEP, CCRP, Exercise Physiologist;Haly Feher Alcus Dad, RN BSN    Medication changes reported      No    Fall or balance concerns reported     No    Warm-up and Cool-down  Performed as group-led Higher education careers adviser Performed  Yes    VAD Patient?  No    PAD/SET Patient?  No      Pain Assessment   Currently in Pain?  No/denies          Social History   Tobacco Use  Smoking Status Never Smoker  Smokeless Tobacco Never Used    Goals Met:  Independence with exercise equipment Exercise tolerated well No report of cardiac concerns or symptoms Strength training completed today  Goals Unmet:  Not Applicable  Comments: Pt able to follow exercise prescription today without complaint.  Will continue to monitor for progression.    Dr. Emily Filbert is Medical Director for [place] and [facility].

## 2018-03-22 DIAGNOSIS — I272 Pulmonary hypertension, unspecified: Secondary | ICD-10-CM

## 2018-03-22 NOTE — Progress Notes (Signed)
Discharge Progress Report  Patient Details  Name: Sherri Price MRN: [id number] Date of Birth: 12-23-1933 Referring Provider:     Pulmonary Rehab from 12/22/2017 in [facility]  Referring Provider  Ramonita Lab MD       Number of Visits: 36/36  Reason for Discharge:  Patient reached a stable level of exercise. Patient independent in their exercise. Patient has met program and personal goals.  Smoking History:  Social History   Tobacco Use  Smoking Status Never Smoker  Smokeless Tobacco Never Used    Diagnosis:  Pulmonary hypertension ([place])  ADL [facility]: Pulmonary Assessment Scores    Row Name 12/22/17 1225 03/08/18 1045 03/10/18 1031     ADL [facility]   ADL Phase  Entry  Exit  Exit   SOB Score total  74  45  45   Rest  _0 Walk  _1 Stairs  _2 Bath  _3 Dress  _4 Shop  _5 CAT Score   CAT Score  _6 mMRC Score   mMRC Score  2  -  2      Initial Exercise Prescription: Initial Exercise Prescription - 12/22/17 1400      Date of Initial Exercise RX and Referring Provider   Date  12/22/17    Referring Provider  Ramonita Lab MD      Oxygen   Oxygen  Continuous    Liters  4-6      Treadmill   MPH  1.2    Grade  0.5    Minutes  15    METs  2      NuStep   Level  4    SPM  80    Minutes  15    METs  2      Biostep-RELP   Level  4    SPM  50    Minutes  15    METs  2      Prescription Details   Frequency (times per week)  3    Duration  Progress to 45 minutes of aerobic exercise without signs/symptoms of physical distress      Intensity   THRR 40-80% of Max Heartrate  96-125    Ratings of Perceived Exertion  11-13    Perceived Dyspnea  0-4      Progression   Progression  Continue to progress workloads to maintain intensity without signs/symptoms of physical distress.      Resistance Training   Training Prescription  Yes    Weight  2 lbs    Reps  10-15       Discharge  Exercise Prescription (Final Exercise Prescription Changes): Exercise Prescription Changes - 03/10/18 1500      Response to Exercise   Blood Pressure (Admit)  126/64    Blood Pressure (Exit)  118/56    Heart Rate (Admit)  87 bpm    Heart Rate (Exercise)  93 bpm    Heart Rate (Exit)  73 bpm    Oxygen Saturation (Admit)  9 %    Oxygen Saturation (Exercise)  73 %   stopped walk test   Oxygen Saturation (Exit)  92 %    Rating of Perceived Exertion (Exercise)  13  Perceived Dyspnea (Exercise)  3    Duration  Continue with 45 min of aerobic exercise without signs/symptoms of physical distress.    Intensity  THRR unchanged      Progression   Progression  Continue to progress workloads to maintain intensity without signs/symptoms of physical distress.    Average METs  2      Resistance Training   Training Prescription  Yes    Weight  2 lb    Reps  10-15      Interval Training   Interval Training  No      Oxygen   Oxygen  Continuous    Liters  6      Treadmill   MPH  1.2    Grade  0.5    Minutes  15    METs  2       Functional Capacity: 6 Minute Walk    Row Name 12/22/17 1425 03/10/18 1029 03/17/18 1041     6 Minute Walk   Phase  Initial  Discharge  Discharge   Distance  688 feet  365 feet  750 feet   Distance % Change  -  -46.9 %  9 %   Distance Feet Change  -  -323 ft  62 ft   Walk Time  6 minutes  3 minutes  6 minutes   # of Rest Breaks  0  0 Test stopped at 3 min for desaturation  0   MPH  1.3  1.38  1.42   METS  1.29  0.89  1.5   RPE  _0 Perceived Dyspnea   _1 VO2 Peak  4.51  3.13  5.26   Symptoms  Yes (comment)  No  Yes (comment)   Comments  SOB  SOB  SOB   Resting HR  68 bpm  87 bpm  90 bpm   Resting BP  124/62  126/64  116/60   Resting Oxygen Saturation   92 %  92 %  93 %   Exercise Oxygen Saturation  during 6 min walk  83 %  73 %  83 %   Max Ex. HR  89 bpm  99 bpm  93 bpm   Max Ex. BP  128/70  144/74  152/78   2 Minute Post BP   122/66  144/70  146/70     Interval HR   1 Minute HR  80  94  79   2 Minute HR  82  99  85   3 Minute HR  83  93  83   4 Minute HR  82  -  93   5 Minute HR  81  -  93   6 Minute HR  89  -  83   2 Minute Post HR  69  -  72   Interval Heart Rate?  Yes  Yes  Yes     Interval Oxygen   Interval Oxygen?  Yes  Yes  Yes   Baseline Oxygen Saturation %  92 %  92 %  93 %   1 Minute Oxygen Saturation %  88 %  86 %  95 %   1 Minute Liters of Oxygen  4 L  4 L continuous (small e-cylinder)  6 L CONTINUOS E CYLINDER   2 Minute Oxygen Saturation %  86 % 2:26 85%  80 %  93 %   2 Minute  Liters of Oxygen  4 L  4 L  6 L   3 Minute Oxygen Saturation %  85 %  73 %  93 %   3 Minute Liters of Oxygen  4 L  4 L  6 L   4 Minute Oxygen Saturation %  84 % 4:20 83%  -  87 %   4 Minute Liters of Oxygen  4 L  -  6 L   5 Minute Oxygen Saturation %  86 %  -  86 %   5 Minute Liters of Oxygen  4 L  -  6 L   6 Minute Oxygen Saturation %  85 %  -  83 %   6 Minute Liters of Oxygen  4 L  -  6 L   2 Minute Post Oxygen Saturation %  90 %  -  94 %   2 Minute Post Liters of Oxygen  4 L  -  6 L      Psychological, QOL, Others - Outcomes: PHQ 2/9: Depression screen [facility] 2/9 03/19/2018 03/08/2018 12/22/2017 07/22/2017 06/24/2017  Decreased Interest 0 _0 Down, Depressed, Hopeless _1 PHQ - 2 Score _2 Altered sleeping _3 Tired, decreased energy _4 Change in appetite _5 0  Feeling bad or failure about yourself  0 0 0 0 0  Trouble concentrating 0 0 0 0 0  Moving slowly or fidgety/restless 0 0 1 0 0  Suicidal thoughts 0 0 0 0 0  PHQ-9 Score _6 Difficult doing work/chores Not difficult at all Somewhat difficult Somewhat difficult - Somewhat difficult    Quality of Life:   Personal Goals: Goals established at orientation with interventions provided to work toward goal. Personal Goals and Risk Factors at Admission - 12/22/17 1302      Core Components/Risk Factors/Patient  Goals on Admission    Weight Management  Weight Maintenance;Yes    Intervention  Weight Management: Develop a combined nutrition and exercise program designed to reach desired caloric intake, while maintaining appropriate intake of nutrient and fiber, sodium and fats, and appropriate energy expenditure required for the weight goal.;Weight Management/Obesity: Establish reasonable short term and long term weight goals.;Weight Management: Provide education and appropriate resources to help participant work on and attain dietary goals.    Admit Weight  99 lb 4.8 oz (45 kg)    Goal Weight: Long Term  100 lb (45.4 kg)    Expected Outcomes  Short Term: Continue to assess and modify interventions until short term weight is achieved;Long Term: Adherence to nutrition and physical activity/exercise program aimed toward attainment of established weight goal;Weight Maintenance: Understanding of the daily nutrition guidelines, which includes 25-35% calories from fat, 7% or less cal from saturated fats, less than 254m cholesterol, less than 1.5gm of sodium, & 5 or more servings of fruits and vegetables daily;Understanding recommendations for meals to include 15-35% energy as protein, 25-35% energy from fat, 35-60% energy from carbohydrates, less than 2032mof dietary cholesterol, 20-35 gm of total fiber daily;Understanding of distribution of calorie intake throughout the day with the consumption of 4-5 meals/snacks    Improve shortness of breath with ADL's  Yes    Intervention  Provide education, individualized exercise plan and daily activity instruction to help decrease symptoms of SOB with activities of daily living.  Expected Outcomes  Short Term: Improve cardiorespiratory fitness to achieve a reduction of symptoms when performing ADLs;Long Term: Be able to perform more ADLs without symptoms or delay the onset of symptoms        Personal Goals Discharge: Goals and Risk Factor Review    Row Name 01/22/18 1056  02/19/18 1033           Core Components/Risk Factors/Patient Goals Review   Personal Goals Review  Weight Management/Obesity;Improve shortness of breath with ADL's  Weight Management/Obesity;Improve shortness of breath with ADL's      Review  Henchy is doing well with her weight.  She is back up to 102lbs.  Her breathing continues to be a little moderate with exercise and she does need to exercise on a higher exercise flow.    She has been holding steady around 102 lbs.  Her breathing is still moderate with activity and finds that weight bearing activities require a higher flow.        Expected Outcomes  Short: Continue to work on weight maintenance.  Long: Continue to work on improving her breathing.   Short: Continue to work on weight maintenance. Long: Continue to work on breathing.          Exercise Goals and Review: Exercise Goals    Row Name 12/22/17 1433             Exercise Goals   Increase Physical Activity  Yes       Intervention  Provide advice, education, support and counseling about physical activity/exercise needs.;Develop an individualized exercise prescription for aerobic and resistive training based on initial evaluation findings, risk stratification, comorbidities and participant's personal goals.       Expected Outcomes  Short Term: Attend rehab on a regular basis to increase amount of physical activity.;Long Term: Add in home exercise to make exercise part of routine and to increase amount of physical activity.;Long Term: Exercising regularly at least 3-5 days a week.       Increase Strength and Stamina  Yes       Intervention  Provide advice, education, support and counseling about physical activity/exercise needs.;Develop an individualized exercise prescription for aerobic and resistive training based on initial evaluation findings, risk stratification, comorbidities and participant's personal goals.       Expected Outcomes  Short Term: Increase workloads from initial  exercise prescription for resistance, speed, and METs.;Short Term: Perform resistance training exercises routinely during rehab and add in resistance training at home;Long Term: Improve cardiorespiratory fitness, muscular endurance and strength as measured by increased METs and functional capacity (6MWT)       Able to understand and use rate of perceived exertion (RPE) scale  Yes       Intervention  Provide education and explanation on how to use RPE scale       Expected Outcomes  Long Term:  Able to use RPE to guide intensity level when exercising independently;Short Term: Able to use RPE daily in rehab to express subjective intensity level       Able to understand and use Dyspnea scale  Yes       Intervention  Provide education and explanation on how to use Dyspnea scale       Expected Outcomes  Short Term: Able to use Dyspnea scale daily in rehab to express subjective sense of shortness of breath during exertion;Long Term: Able to use Dyspnea scale to guide intensity level when exercising independently       Knowledge and  understanding of Target Heart Rate Range (THRR)  Yes       Intervention  Provide education and explanation of THRR including how the numbers were predicted and where they are located for reference       Expected Outcomes  Short Term: Able to state/look up THRR;Long Term: Able to use THRR to govern intensity when exercising independently;Short Term: Able to use daily as guideline for intensity in rehab       Able to check pulse independently  Yes       Intervention  Provide education and demonstration on how to check pulse in carotid and radial arteries.;Review the importance of being able to check your own pulse for safety during independent exercise       Expected Outcomes  Short Term: Able to explain why pulse checking is important during independent exercise;Long Term: Able to check pulse independently and accurately       Understanding of Exercise Prescription  Yes        Intervention  Provide education, explanation, and written materials on patient's individual exercise prescription       Expected Outcomes  Short Term: Able to explain program exercise prescription;Long Term: Able to explain home exercise prescription to exercise independently          Nutrition & Weight - Outcomes: Pre Biometrics - 12/22/17 1434      Pre Biometrics   Height  4' 11.2" (1.504 m)    Weight  99 lb 4.8 oz (45 kg)    Waist Circumference  27 inches    Hip Circumference  35.5 inches    Waist to Hip Ratio  0.76 %    BMI (Calculated)  19.91    Single Leg Stand  2.43 seconds      Post Biometrics - 03/10/18 1618       Post  Biometrics   Height  4' 11.2" (1.504 m)    Weight  103 lb 14.4 oz (47.1 kg)    Waist Circumference  28.25 inches    Hip Circumference  35 inches    Waist to Hip Ratio  0.81 %    BMI (Calculated)  20.83    Single Leg Stand  4.15 seconds       Nutrition: Nutrition Therapy & Goals - 12/28/17 1037      Nutrition Therapy   Diet  TLC    Protein (specify units)  6oz    Fiber  20 grams    Whole Grain Foods  3 servings    Saturated Fats  12 max. grams    Fruits and Vegetables  5 servings/day   8 ideal, eats small portions   Sodium  1500 grams      Personal Nutrition Goals   Nutrition Goal  Drink Boost nutritional shakes at least once per day to help meet calorie and protein needs. You can also look for the Ensure high protein variety    Personal Goal #2  Be consistent about consuming an evening snack, along with regular meals/ snacks throughout the day. Eating on a consistent schedule will help prevent wt loss    Personal Goal #3  Continue to include sources of calorie-dense foods such as fats in your diet regularly. Since you like nuts, these would make a great snack    Comments  She tries to eat as much as she can at meal times but still struggles to meet daily energy needs. She has been eating dried fruit, nuts, and some protein shakes/ snacks  Intervention Plan   Intervention  Prescribe, educate and counsel regarding individualized specific dietary modifications aiming towards targeted core components such as weight, hypertension, lipid management, diabetes, heart failure and other comorbidities.    Expected Outcomes  Short Term Goal: Understand basic principles of dietary content, such as calories, fat, sodium, cholesterol and nutrients.;Short Term Goal: A plan has been developed with personal nutrition goals set during dietitian appointment.;Long Term Goal: Adherence to prescribed nutrition plan.       Nutrition Discharge: Nutrition Assessments - 03/08/18 1047      MEDFICTS Scores   Pre Score  13    Post Score  15    Score Difference  2       Education Questionnaire Score: Knowledge Questionnaire Score - 03/08/18 1047      Knowledge Questionnaire Score   Pre Score  16/18    Post Score  17/18   reviewed with patient      Goals reviewed with patient; copy given to patient.

## 2018-03-22 NOTE — Progress Notes (Signed)
Pulmonary Individual Treatment Plan  Patient Details  Name: Sherri Price MRN: [id number] Date of Birth: 09/28/1933 Referring Provider:     Pulmonary Rehab from 12/22/2017 in [facility]  Referring Provider  Klein, Bert MD      Initial Encounter Date:    Pulmonary Rehab from 12/22/2017 in [facility]  Date  12/22/17      Visit Diagnosis: Pulmonary hypertension (HCC)  Patient's Home Medications on Admission:  Current Outpatient Medications:  .  Ascorbic Acid (VITAMIN C) 1000 MG tablet, Take 1,000 mg by mouth 2 (two) times daily., Disp: , Rfl:  .  Cholecalciferol (VITAMIN D3) 5000 UNITS CAPS, Take 1 capsule by mouth daily., Disp: , Rfl:  .  Coenzyme Q10 100 MG capsule, Take 100 mg by mouth daily. , Disp: , Rfl:  .  Cranberry Fruit 405 MG CAPS, Take 1 capsule by mouth 2 (two) times daily. , Disp: , Rfl:  .  Evening Primrose Oil 500 MG CAPS, Take 1 capsule by mouth 2 (two) times daily. , Disp: , Rfl:  .  FLUOCINOLONE ACETONIDE SCALP 0.01 % OIL, 1 application by Other route daily as needed (psoriasis on scalp). , Disp: , Rfl:  .  L-Lysine 1000 MG TABS, Take 1 tablet by mouth 2 (two) times daily. , Disp: , Rfl:  .  levothyroxine (LEVOXYL) 100 MCG tablet, Take 100 mcg by mouth daily before breakfast. , Disp: , Rfl:  .  Multiple Vitamin (MULTIVITAMIN WITH MINERALS) TABS tablet, Take 1 tablet by mouth daily., Disp: , Rfl:  .  NON FORMULARY, Take 1 tablet by mouth 2 (two) times daily. calcium hydroxyapatite, Disp: , Rfl:  .  NON FORMULARY, Take 1 capsule by mouth 2 (two) times daily. With lunch and dinner. lactate prophase papain bromein, Disp: , Rfl:  .  NON FORMULARY, Take 1 tablet by mouth 2 (two) times daily. Quercetine, Disp: , Rfl:  .  NON FORMULARY, Take 1 Dose by mouth daily. Mixes these 3 OTC triple fiber, fiber smart, psyllium in a cup of juice every morning, Disp: , Rfl:  .  NON FORMULARY, Take 1 capsule by mouth 2 (two) times daily. Eye  health with lutein, Disp: , Rfl:  .  omeprazole (PRILOSEC) 20 MG capsule, Take 20 mg by mouth daily. , Disp: , Rfl:  .  Probiotic Product (PROBIOTIC DAILY PO), Take 2 tablets by mouth at bedtime., Disp: , Rfl:  .  Red Yeast Rice Extract 600 MG CAPS, Take 1 capsule by mouth 2 (two) times daily. , Disp: , Rfl:  .  rivaroxaban (XARELTO) 20 MG TABS tablet, Take 20 mg by mouth 2 (two) times daily at 10 AM and 5 PM., Disp: , Rfl:  .  TURMERIC CURCUMIN PO, Take 1 capsule by mouth daily. Use. One bid, Disp: , Rfl:  .  vitamin E 400 UNIT capsule, Take 400 Units by mouth 2 (two) times daily. , Disp: , Rfl:   Past Medical History: Past Medical History:  Diagnosis Date  . Arthritis   . Bell's palsy   . Cough    CHRONIC  . GERD (gastroesophageal reflux disease)   . Hypothyroidism   . IBS (irritable bowel syndrome)   . Neuropathy   . Oxygen decrease    USES AT NIGHT  . Pulmonary fibrosis (HCC)   . Shortness of breath dyspnea     Tobacco Use: Social History   Tobacco Use  Smoking Status Never Smoker  Smokeless Tobacco   Never Used    Labs: Recent Review Flowsheet Data    There is no flowsheet data to display.       Pulmonary Assessment Scores: Pulmonary Assessment Scores    Row Name 12/22/17 1225 03/08/18 1045 03/10/18 1031     ADL UCSD   ADL Phase  Entry  Exit  Exit   SOB Score total  74  45  45   Rest  '4  1  1   '$ Walk  '4  3  3   '$ Stairs  '3  4  4   '$ Bath  '4  2  2   '$ Dress  '4  2  2   '$ Shop  '4  2  2     '$ CAT Score   CAT Score  '12  18  18     '$ mMRC Score   mMRC Score  2  -  2      Pulmonary Function Assessment: Pulmonary Function Assessment - 12/22/17 1323      Breath   Bilateral Breath Sounds  Clear    Shortness of Breath  Fear of Shortness of Breath;Panic with Shortness of Breath;Limiting activity;Yes       Exercise Target Goals: Exercise Program Goal: Individual exercise prescription set using results from initial 6 min walk test and THRR while considering  patient's  activity barriers and safety.   Exercise Prescription Goal: Initial exercise prescription builds to 30-45 minutes a day of aerobic activity, 2-3 days per week.  Home exercise guidelines will be given to patient during program as part of exercise prescription that the participant will acknowledge.  Activity Barriers & Risk Stratification: Activity Barriers & Cardiac Risk Stratification - 12/22/17 1428      Activity Barriers & Cardiac Risk Stratification   Activity Barriers  Deconditioning;Muscular Weakness;Shortness of Breath       6 Minute Walk: 6 Minute Walk    Row Name 12/22/17 1425 03/10/18 1029 03/17/18 1041     6 Minute Walk   Phase  Initial  Discharge  Discharge   Distance  688 feet  365 feet  750 feet   Distance % Change  -  -46.9 %  9 %   Distance Feet Change  -  -323 ft  62 ft   Walk Time  6 minutes  3 minutes  6 minutes   # of Rest Breaks  0  0 Test stopped at 3 min for desaturation  0   MPH  1.3  1.38  1.42   METS  1.29  0.89  1.5   RPE  '12  13  14   '$ Perceived Dyspnea   '2  3  3   '$ VO2 Peak  4.51  3.13  5.26   Symptoms  Yes (comment)  No  Yes (comment)   Comments  SOB  SOB  SOB   Resting HR  68 bpm  87 bpm  90 bpm   Resting BP  124/62  126/64  116/60   Resting Oxygen Saturation   92 %  92 %  93 %   Exercise Oxygen Saturation  during 6 min walk  83 %  73 %  83 %   Max Ex. HR  89 bpm  99 bpm  93 bpm   Max Ex. BP  128/70  144/74  152/78   2 Minute Post BP  122/66  144/70  146/70     Interval HR   1 Minute HR  80  94  79  2 Minute HR  82  99  85   3 Minute HR  83  93  83   4 Minute HR  82  -  93   5 Minute HR  81  -  93   6 Minute HR  89  -  83   2 Minute Post HR  69  -  72   Interval Heart Rate?  Yes  Yes  Yes     Interval Oxygen   Interval Oxygen?  Yes  Yes  Yes   Baseline Oxygen Saturation %  92 %  92 %  93 %   1 Minute Oxygen Saturation %  88 %  86 %  95 %   1 Minute Liters of Oxygen  4 L  4 L continuous (small e-cylinder)  6 L CONTINUOS E CYLINDER   2  Minute Oxygen Saturation %  86 % 2:26 85%  80 %  93 %   2 Minute Liters of Oxygen  4 L  4 L  6 L   3 Minute Oxygen Saturation %  85 %  73 %  93 %   3 Minute Liters of Oxygen  4 L  4 L  6 L   4 Minute Oxygen Saturation %  84 % 4:20 83%  -  87 %   4 Minute Liters of Oxygen  4 L  -  6 L   5 Minute Oxygen Saturation %  86 %  -  86 %   5 Minute Liters of Oxygen  4 L  -  6 L   6 Minute Oxygen Saturation %  85 %  -  83 %   6 Minute Liters of Oxygen  4 L  -  6 L   2 Minute Post Oxygen Saturation %  90 %  -  94 %   2 Minute Post Liters of Oxygen  4 L  -  6 L     Oxygen Initial Assessment: Oxygen Initial Assessment - 12/22/17 1302      Home Oxygen   Home Oxygen Device  Home Concentrator;E-Tanks    Sleep Oxygen Prescription  Continuous    Liters per minute  3    Home Exercise Oxygen Prescription  Continuous    Liters per minute  4    Home at Rest Exercise Oxygen Prescription  Continuous    Liters per minute  4    Compliance with Home Oxygen Use  Yes      Initial 6 min Walk   Oxygen Used  Continuous    Liters per minute  4      Program Oxygen Prescription   Program Oxygen Prescription  Continuous    Liters per minute  4      Intervention   Short Term Goals  To learn and understand importance of monitoring SPO2 with pulse oximeter and demonstrate accurate use of the pulse oximeter.;To learn and demonstrate proper pursed lip breathing techniques or other breathing techniques.;To learn and understand importance of maintaining oxygen saturations>88%;To learn and demonstrate proper use of respiratory medications;To learn and exhibit compliance with exercise, home and travel O2 prescription    Long  Term Goals  Exhibits compliance with exercise, home and travel O2 prescription;Verbalizes importance of monitoring SPO2 with pulse oximeter and return demonstration;Maintenance of O2 saturations>88%;Exhibits proper breathing techniques, such as pursed lip breathing or other method taught during  program session;Compliance with respiratory medication;Demonstrates proper use of MDI's  Oxygen Re-Evaluation: Oxygen Re-Evaluation    Row Name 12/25/17 1018 01/22/18 1059 02/10/18 1535 02/19/18 1037       Program Oxygen Prescription   Program Oxygen Prescription  Continuous  Continuous  Continuous  Continuous    Liters per minute  4  4 6L walking  '4  4 6 '$ liters on treadmill      Home Oxygen   Home Oxygen Device  Home Concentrator;E-Tanks  Home Concentrator;E-Tanks  Home Concentrator;E-Tanks  Home Concentrator;E-Tanks    Sleep Oxygen Prescription  Continuous  Continuous  Continuous  Continuous    Liters per minute  '3  3  3  3    '$ Home Exercise Oxygen Prescription  Continuous  Continuous  Continuous  Continuous    Liters per minute  '4  4  4  4 '$ sometimes will increase to 5L    Home at Rest Exercise Oxygen Prescription  Continuous  Continuous  Continuous  Continuous    Liters per minute  4  4 sometimes will titrate to 5L  4  4    Compliance with Home Oxygen Use  -  Yes  Yes  Yes      Goals/Expected Outcomes   Short Term Goals  To learn and understand importance of monitoring SPO2 with pulse oximeter and demonstrate accurate use of the pulse oximeter.;To learn and demonstrate proper pursed lip breathing techniques or other breathing techniques.;To learn and understand importance of maintaining oxygen saturations>88%;To learn and demonstrate proper use of respiratory medications;To learn and exhibit compliance with exercise, home and travel O2 prescription  To learn and understand importance of monitoring SPO2 with pulse oximeter and demonstrate accurate use of the pulse oximeter.;To learn and demonstrate proper pursed lip breathing techniques or other breathing techniques.;To learn and understand importance of maintaining oxygen saturations>88%;To learn and demonstrate proper use of respiratory medications;To learn and exhibit compliance with exercise, home and travel O2 prescription  To  learn and demonstrate proper use of respiratory medications;To learn and exhibit compliance with exercise, home and travel O2 prescription  To learn and demonstrate proper use of respiratory medications;To learn and exhibit compliance with exercise, home and travel O2 prescription;To learn and understand importance of maintaining oxygen saturations>88%;To learn and understand importance of monitoring SPO2 with pulse oximeter and demonstrate accurate use of the pulse oximeter.;To learn and demonstrate proper pursed lip breathing techniques or other breathing techniques.    Long  Term Goals  Exhibits compliance with exercise, home and travel O2 prescription;Verbalizes importance of monitoring SPO2 with pulse oximeter and return demonstration;Maintenance of O2 saturations>88%;Exhibits proper breathing techniques, such as pursed lip breathing or other method taught during program session;Compliance with respiratory medication;Demonstrates proper use of MDI's  Exhibits compliance with exercise, home and travel O2 prescription;Verbalizes importance of monitoring SPO2 with pulse oximeter and return demonstration;Maintenance of O2 saturations>88%;Exhibits proper breathing techniques, such as pursed lip breathing or other method taught during program session;Compliance with respiratory medication;Demonstrates proper use of MDI's  Compliance with respiratory medication;Maintenance of O2 saturations>88%;Exhibits compliance with exercise, home and travel O2 prescription  Compliance with respiratory medication;Maintenance of O2 saturations>88%;Exhibits compliance with exercise, home and travel O2 prescription;Verbalizes importance of monitoring SPO2 with pulse oximeter and return demonstration;Exhibits proper breathing techniques, such as pursed lip breathing or other method taught during program session    Comments  Reviewed PLB technique with pt.  Talked about how it work and it's important to maintaining his exercise  saturations.    Myca has been doing well and staying compliant with her  oxygen therapy.  She is doing well on her oxygen and still getting used to using it constantly.  She has been using her PLB regualrly and finds helpful.   Patient has been doing well in the program and wants to continue exercise after she is done. She checks her oxygen at home and uses PLB. Her oxygen is on 4 liters when exercising and on 6 liters when using the treadmill. She has been getting around the house a little easier since she has been exercising. She has not had a sleep study done before but wears 3 liters at night. Informed her to check her oxygen at night if she wakes up from sleep to make sure her oxygen is staying above 88 percent.  Kaylamarie has been compliant with her oxygen therapy.  She is still using her PLB with more of her acitities.  Her husband is very supportive in helping her with her oxygen. She uses her inhaler as needed and is doing well with everything.      Goals/Expected Outcomes  Short: Become more profiecient at using PLB.   Long: Become independent at using PLB.  Short: Continue to use PLB and stay compliant with oxygen.  Long: Continue to be compliant.   Short: check oxygen at night for sleep. Long: maintain monitoring oxygen to stay above 88 percent.  Short: Continue to work on PLB.  Long: Continue to stay compliant with oxygen therapy.        Oxygen Discharge (Final Oxygen Re-Evaluation): Oxygen Re-Evaluation - 02/19/18 1037      Program Oxygen Prescription   Program Oxygen Prescription  Continuous    Liters per minute  4   6 liters on treadmill     Home Oxygen   Home Oxygen Device  Home Concentrator;E-Tanks    Sleep Oxygen Prescription  Continuous    Liters per minute  3    Home Exercise Oxygen Prescription  Continuous    Liters per minute  4   sometimes will increase to 5L   Home at Rest Exercise Oxygen Prescription  Continuous    Liters per minute  4    Compliance with Home Oxygen Use   Yes      Goals/Expected Outcomes   Short Term Goals  To learn and demonstrate proper use of respiratory medications;To learn and exhibit compliance with exercise, home and travel O2 prescription;To learn and understand importance of maintaining oxygen saturations>88%;To learn and understand importance of monitoring SPO2 with pulse oximeter and demonstrate accurate use of the pulse oximeter.;To learn and demonstrate proper pursed lip breathing techniques or other breathing techniques.    Long  Term Goals  Compliance with respiratory medication;Maintenance of O2 saturations>88%;Exhibits compliance with exercise, home and travel O2 prescription;Verbalizes importance of monitoring SPO2 with pulse oximeter and return demonstration;Exhibits proper breathing techniques, such as pursed lip breathing or other method taught during program session    Comments  Koreena has been compliant with her oxygen therapy.  She is still using her PLB with more of her acitities.  Her husband is very supportive in helping her with her oxygen. She uses her inhaler as needed and is doing well with everything.      Goals/Expected Outcomes  Short: Continue to work on PLB.  Long: Continue to stay compliant with oxygen therapy.        Initial Exercise Prescription: Initial Exercise Prescription - 12/22/17 1400      Date of Initial Exercise RX and Referring Provider   Date  12/22/17  Referring Provider  Ramonita Lab MD      Oxygen   Oxygen  Continuous    Liters  4-6      Treadmill   MPH  1.2    Grade  0.5    Minutes  15    METs  2      NuStep   Level  4    SPM  80    Minutes  15    METs  2      Biostep-RELP   Level  4    SPM  50    Minutes  15    METs  2      Prescription Details   Frequency (times per week)  3    Duration  Progress to 45 minutes of aerobic exercise without signs/symptoms of physical distress      Intensity   THRR 40-80% of Max Heartrate  96-125    Ratings of Perceived Exertion  11-13     Perceived Dyspnea  0-4      Progression   Progression  Continue to progress workloads to maintain intensity without signs/symptoms of physical distress.      Resistance Training   Training Prescription  Yes    Weight  2 lbs    Reps  10-15       Perform Capillary Blood Glucose checks as needed.  Exercise Prescription Changes:  Exercise Prescription Changes    Row Name 12/22/17 1400 12/31/17 1000 01/08/18 1100 01/13/18 1500 01/28/18 1000     Response to Exercise   Blood Pressure (Admit)  124/62  116/60  116/60  136/78  120/62   Blood Pressure (Exercise)  128/70  130/70  130/70  -  -   Blood Pressure (Exit)  122/66  120/66  120/66  126/72  120/56   Heart Rate (Admit)  68 bpm  83 bpm  83 bpm  74 bpm  76 bpm   Heart Rate (Exercise)  89 bpm  74 bpm  74 bpm  76 bpm  78 bpm   Heart Rate (Exit)  69 bpm  62 bpm  62 bpm  75 bpm  69 bpm   Oxygen Saturation (Admit)  92 %  89 %  89 %  92 %  94 %   Oxygen Saturation (Exercise)  83 %  89 %  89 %  87 %  87 %   Oxygen Saturation (Exit)  90 %  96 %  96 %  97 %  90 %   Rating of Perceived Exertion (Exercise)  '12  13  13  13  13   '$ Perceived Dyspnea (Exercise)  '2  2  2  2  3   '$ Symptoms  SOB  -  -  -  -   Comments  walk test results  4th session in pulm  4th session in pulm  -  -   Duration  -  Progress to 45 minutes of aerobic exercise without signs/symptoms of physical distress  Progress to 45 minutes of aerobic exercise without signs/symptoms of physical distress  Continue with 45 min of aerobic exercise without signs/symptoms of physical distress.  Continue with 45 min of aerobic exercise without signs/symptoms of physical distress.   Intensity  -  THRR unchanged  THRR unchanged  THRR unchanged reaching RPE goals  THRR unchanged     Progression   Progression  -  -  -  Continue to progress workloads to maintain intensity without signs/symptoms of physical distress.  Continue to progress workloads to maintain intensity without signs/symptoms of  physical distress.   Average METs  -  -  -  1.8  1.7     Resistance Training   Training Prescription  -  Yes  Yes  Yes  Yes   Weight  -  2 lb  2 lb  2 lb  2 lb   Reps  -  10-15  10-15  10-15  10-15     Interval Training   Interval Training  -  -  -  No  No     Oxygen   Oxygen  -  Continuous  Continuous  Continuous  Continuous   Liters  -  4-6  4-6  4-6  6     Treadmill   MPH  -  1.2  1.2  1.2  1.2   Grade  -  0.5  0.5  0.5  0.5   Minutes  -  '15  15  15  15   '$ METs  -  '2  2  2  2     '$ NuStep   Level  -  -  -  4  4   SPM  -  -  -  80  80   Minutes  -  -  -  15  15   METs  -  -  -  1.6  1.4     Arm Ergometer   Level  -  1  1  -  -   Minutes  -  15  15  -  -     Home Exercise Plan   Plans to continue exercise at  -  -  Home (comment) walks at home due to needing O2  -  -   Frequency  -  -  Add 2 additional days to program exercise sessions.  -  -   Initial Home Exercises Provided  -  -  01/08/18  -  -   Vera Name 02/11/18 1100 02/24/18 1200 03/10/18 1500         Response to Exercise   Blood Pressure (Admit)  126/72  122/56  126/64     Blood Pressure (Exit)  118/58  114/50  118/56     Heart Rate (Admit)  80 bpm  71 bpm  87 bpm     Heart Rate (Exercise)  76 bpm  85 bpm  93 bpm     Heart Rate (Exit)  70 bpm  69 bpm  73 bpm     Oxygen Saturation (Admit)  92 %  96 %  9 %     Oxygen Saturation (Exercise)  89 %  84 %  73 % stopped walk test     Oxygen Saturation (Exit)  99 %  94 %  92 %     Rating of Perceived Exertion (Exercise)  '12  12  13     '$ Perceived Dyspnea (Exercise)  '2  3  3     '$ Duration  Continue with 45 min of aerobic exercise without signs/symptoms of physical distress.  Continue with 45 min of aerobic exercise without signs/symptoms of physical distress.  Continue with 45 min of aerobic exercise without signs/symptoms of physical distress.     Intensity  THRR unchanged  THRR unchanged  THRR unchanged       Progression   Progression  Continue to progress workloads  to maintain intensity without signs/symptoms of physical distress.  Continue to progress  workloads to maintain intensity without signs/symptoms of physical distress.  Continue to progress workloads to maintain intensity without signs/symptoms of physical distress.     Average METs  1.'9  2  2       '$ Resistance Training   Training Prescription  Yes  Yes  Yes     Weight  2 lb  2 lb  2 lb     Reps  10-15  10-15  10-15       Interval Training   Interval Training  No  No  No       Oxygen   Oxygen  Continuous  Continuous  Continuous     Liters  '6   6  6       '$ Treadmill   MPH  1.2  1.2  1.2     Grade  0.5  0.5  0.5     Minutes  '15  15  15     '$ METs  '2  2  2       '$ NuStep   Level  4  4  -     SPM  80  80  -     Minutes  15  15  -     METs  1.8  1.9  -        Exercise Comments:  Exercise Comments    Row Name 12/25/17 1016 03/10/18 1033 03/22/18 0950       Exercise Comments  First full day of exercise!  Patient was oriented to gym and equipment including functions, settings, policies, and procedures.  Patient's individual exercise prescription and treatment plan were reviewed.  All starting workloads were established based on the results of the 6 minute walk test done at initial orientation visit.  The plan for exercise progression was also introduced and progression will be customized based on patient's performance and goals.   We did Latesa's post 6MWT today, but she desaturated to 73%.  We will try again next week and will let her pull large e-cylinder to see if she can improve distance and not desaturate.  Kiarah graduated today from  rehab with 36 sessions completed.  Details of the patient's exercise prescription and what She needs to do in order to continue the prescription and progress were discussed with patient.  Patient was given a copy of prescription and goals.  Patient verbalized understanding.  Marlenne plans to continue to exercise by joining [employer].        Exercise Goals  and Review:  Exercise Goals    Row Name 12/22/17 1433             Exercise Goals   Increase Physical Activity  Yes       Intervention  Provide advice, education, support and counseling about physical activity/exercise needs.;Develop an individualized exercise prescription for aerobic and resistive training based on initial evaluation findings, risk stratification, comorbidities and participant's personal goals.       Expected Outcomes  Short Term: Attend rehab on a regular basis to increase amount of physical activity.;Long Term: Add in home exercise to make exercise part of routine and to increase amount of physical activity.;Long Term: Exercising regularly at least 3-5 days a week.       Increase Strength and Stamina  Yes       Intervention  Provide advice, education, support and counseling about physical activity/exercise needs.;Develop an individualized exercise prescription for aerobic and resistive training based on initial evaluation findings, risk stratification, comorbidities  and participant's personal goals.       Expected Outcomes  Short Term: Increase workloads from initial exercise prescription for resistance, speed, and METs.;Short Term: Perform resistance training exercises routinely during rehab and add in resistance training at home;Long Term: Improve cardiorespiratory fitness, muscular endurance and strength as measured by increased METs and functional capacity (6MWT)       Able to understand and use rate of perceived exertion (RPE) scale  Yes       Intervention  Provide education and explanation on how to use RPE scale       Expected Outcomes  Long Term:  Able to use RPE to guide intensity level when exercising independently;Short Term: Able to use RPE daily in rehab to express subjective intensity level       Able to understand and use Dyspnea scale  Yes       Intervention  Provide education and explanation on how to use Dyspnea scale       Expected Outcomes  Short Term: Able  to use Dyspnea scale daily in rehab to express subjective sense of shortness of breath during exertion;Long Term: Able to use Dyspnea scale to guide intensity level when exercising independently       Knowledge and understanding of Target Heart Rate Range (THRR)  Yes       Intervention  Provide education and explanation of THRR including how the numbers were predicted and where they are located for reference       Expected Outcomes  Short Term: Able to state/look up THRR;Long Term: Able to use THRR to govern intensity when exercising independently;Short Term: Able to use daily as guideline for intensity in rehab       Able to check pulse independently  Yes       Intervention  Provide education and demonstration on how to check pulse in carotid and radial arteries.;Review the importance of being able to check your own pulse for safety during independent exercise       Expected Outcomes  Short Term: Able to explain why pulse checking is important during independent exercise;Long Term: Able to check pulse independently and accurately       Understanding of Exercise Prescription  Yes       Intervention  Provide education, explanation, and written materials on patient's individual exercise prescription       Expected Outcomes  Short Term: Able to explain program exercise prescription;Long Term: Able to explain home exercise prescription to exercise independently          Exercise Goals Re-Evaluation : Exercise Goals Re-Evaluation    Row Name 12/25/17 1017 12/31/17 1044 01/08/18 1139 01/13/18 1505 01/22/18 1049     Exercise Goal Re-Evaluation   Exercise Goals Review  Able to understand and use Dyspnea scale;Knowledge and understanding of Target Heart Rate Range (THRR);Understanding of Exercise Prescription;Able to understand and use rate of perceived exertion (RPE) scale  Increase Physical Activity;Increase Strength and Stamina;Able to understand and use rate of perceived exertion (RPE) scale;Able to  understand and use Dyspnea scale  Increase Physical Activity;Increase Strength and Stamina;Able to understand and use rate of perceived exertion (RPE) scale;Able to understand and use Dyspnea scale;Knowledge and understanding of Target Heart Rate Range (THRR);Able to check pulse independently;Understanding of Exercise Prescription  Increase Physical Activity;Increase Strength and Stamina;Able to understand and use rate of perceived exertion (RPE) scale;Able to understand and use Dyspnea scale  Increase Physical Activity;Increase Strength and Stamina;Understanding of Exercise Prescription   Comments  Reviewed RPE  scale, THR and program prescription with pt today.  Pt voiced understanding and was given a copy of goals to take home.   Shanele has transitioned from CARE to [facility].  Staff are working with her to have her try different equipment to improve her fitness.  She did not like the Biostep and so tried Arm Crank.  Staff will monitor progress.  Reviewed home exercise with pt today.  Pt plans to walk for exercise.  Reviewed THR, pulse, RPE, sign and symptoms, NTG use, and when to call 911 or MD.  Also discussed weather considerations and indoor options.  Pt voiced understanding.  Geanette prefers TM to [employer] so she is doing TM for 30 min.  Her RPE indicates she is working somewhat hard while HR does not reach target goals.  Geena is doing well in rehab.  We have finally found three pieces that work for her.  She is considering joining [employer] after graduation.  She has been walking in the house and doing her weights at home.  She ususally will walk for 68mn at home.  We talked about increase time of walking to 330m.     Expected Outcomes  Short: Use RPE daily to regulate intensity. Long: Follow program prescription in THR.  Short - continue to attend and try different machines Long - maintain or improve current MET level  Short - Orilla will continue to walk at home on days not at class [employer]  maintain current fitness level  Short - Continue to attend regularly Long - Maintain current level of fitness  Short: Increase exercise time at home to 3050m  Long: Continue to improve strength and stamina and activity level.     RowIoneme 01/28/18 1008 02/11/18 1115 02/19/18 1029 02/24/18 1245 03/10/18 1505     Exercise Goal Re-Evaluation   Exercise Goals Review  Increase Physical Activity;Increase Strength and Stamina;Able to understand and use rate of perceived exertion (RPE) scale;Able to understand and use Dyspnea scale  Increase Physical Activity;Increase Strength and Stamina;Able to understand and use rate of perceived exertion (RPE) scale;Able to understand and use Dyspnea scale  Increase Physical Activity;Increase Strength and Stamina;Understanding of Exercise Prescription  Increase Physical Activity;Increase Strength and Stamina;Able to understand and use rate of perceived exertion (RPE) scale;Able to understand and use Dyspnea scale  Increase Physical Activity;Increase Strength and Stamina;Able to understand and use rate of perceived exertion (RPE) scale;Able to understand and use Dyspnea scale;Understanding of Exercise Prescription   Comments  Michie is maintaining her MET level. She attends consistently.    Alira has made slight improvement in METS on NS.  She does most work she can while maintaining 02 sats above 88.  MarLetonia doing well in rehab.  She has been walking at home in the house for about 10-15m76mt a time.  We talked about increasing her time to 30 min.  Her strength has been about the same.   She is planning to do [employer] when she graduateds  Alveena attends consistently.   Her goal at this time is to maintain current fitness and continue in [employer] she completes LW.  Marlaysia is at a level where maintaining current function is appropriate.  She plans to go to [employer] she graduates.   Expected Outcomes  Short - continue to exercise regularly Long - maintain  current MET level   Short - continue to attend 3 days per week Long - maintain exercise on her  own in [place]: Continue to increase time of exercise at home.  Long: Continue to exercise independently and in [employer].   Short - continue with current work levels Long - maintain current fitness   Short - comlete LW program Long - maintain exercise in FF      Discharge Exercise Prescription (Final Exercise Prescription Changes): Exercise Prescription Changes - 03/10/18 1500      Response to Exercise   Blood Pressure (Admit)  126/64    Blood Pressure (Exit)  118/56    Heart Rate (Admit)  87 bpm    Heart Rate (Exercise)  93 bpm    Heart Rate (Exit)  73 bpm    Oxygen Saturation (Admit)  9 %    Oxygen Saturation (Exercise)  73 %   stopped walk test   Oxygen Saturation (Exit)  92 %    Rating of Perceived Exertion (Exercise)  13    Perceived Dyspnea (Exercise)  3    Duration  Continue with 45 min of aerobic exercise without signs/symptoms of physical distress.    Intensity  THRR unchanged      Progression   Progression  Continue to progress workloads to maintain intensity without signs/symptoms of physical distress.    Average METs  2      Resistance Training   Training Prescription  Yes    Weight  2 lb    Reps  10-15      Interval Training   Interval Training  No      Oxygen   Oxygen  Continuous    Liters  6      Treadmill   MPH  1.2    Grade  0.5    Minutes  15    METs  2       Nutrition:  Target Goals: Understanding of nutrition guidelines, daily intake of sodium '1500mg'$ , cholesterol '200mg'$ , calories 30% from fat and 7% or less from saturated fats, daily to have 5 or more servings of fruits and vegetables.  Biometrics: Pre Biometrics - 12/22/17 1434      Pre Biometrics   Height  4' 11.2" (1.504 m)    Weight  99 lb 4.8 oz (45 kg)    Waist Circumference  27 inches    Hip Circumference  35.5 inches    Waist to Hip Ratio  0.76 %    BMI (Calculated)  19.91     Single Leg Stand  2.43 seconds      Post Biometrics - 03/10/18 1618       Post  Biometrics   Height  4' 11.2" (1.504 m)    Weight  103 lb 14.4 oz (47.1 kg)    Waist Circumference  28.25 inches    Hip Circumference  35 inches    Waist to Hip Ratio  0.81 %    BMI (Calculated)  20.83    Single Leg Stand  4.15 seconds       Nutrition Therapy Plan and Nutrition Goals: Nutrition Therapy & Goals - 12/28/17 1037      Nutrition Therapy   Diet  TLC    Protein (specify units)  6oz    Fiber  20 grams    Whole Grain Foods  3 servings    Saturated Fats  12 max. grams    Fruits and Vegetables  5 servings/day   8 ideal, eats small portions   Sodium  1500 grams      Personal Nutrition Goals  Nutrition Goal  Drink Boost nutritional shakes at least once per day to help meet calorie and protein needs. You can also look for the Ensure high protein variety    Personal Goal #2  Be consistent about consuming an evening snack, along with regular meals/ snacks throughout the day. Eating on a consistent schedule will help prevent wt loss    Personal Goal #3  Continue to include sources of calorie-dense foods such as fats in your diet regularly. Since you like nuts, these would make a great snack    Comments  She tries to eat as much as she can at meal times but still struggles to meet daily energy needs. She has been eating dried fruit, nuts, and some protein shakes/ snacks      Intervention Plan   Intervention  Prescribe, educate and counsel regarding individualized specific dietary modifications aiming towards targeted core components such as weight, hypertension, lipid management, diabetes, heart failure and other comorbidities.    Expected Outcomes  Short Term Goal: Understand basic principles of dietary content, such as calories, fat, sodium, cholesterol and nutrients.;Short Term Goal: A plan has been developed with personal nutrition goals set during dietitian appointment.;Long Term Goal:  Adherence to prescribed nutrition plan.       Nutrition Assessments: Nutrition Assessments - 03/08/18 1047      MEDFICTS Scores   Pre Score  13    Post Score  15    Score Difference  2       Nutrition Goals Re-Evaluation: Nutrition Goals Re-Evaluation    Row Name 12/28/17 1054 02/03/18 1045 02/22/18 1049         Goals   Nutrition Goal  Be consistent about consuming an evening snack, along with regular meals/ snacks throughout the day. Eating on a consistent schedule will help prevent wt loss  Be consistent about consuming an evening snack and with eating on a regular schedule throughout the day. Include calorically-dense foods and nutritional suppelment drinks like Boost daily  Eat on a regular schedule and be more consistent about eating an evening snack; drink Boost nutritional drinks at least once per day; continue to include sources of calorie-dense foods such as fats to help maintain weight     Comment  She c/o struggling to eat enough at meal times despite trying to eat as much as she can  She is trying to include evening snacks but does not eat one every day. Lately she has been eating a banana '@HS'$ . She tries to eat regularly throughout the day and drinks Boost shakes, but not daily and typically only half or 3/4.  She has been trying to be more consistent about eating snacks between meals. She bought granola to eat and put on yogurt which she has been enjoying. She continues to drink Boost semi-regularly     Expected Outcome  She will eat smaller, more frequent meals in an attempt to meet daily nutritional needs. Emphasize protein and fats  She will try cottage cheese as another evening snack option, and she will make more of an effort to drink 100% of Boost supplement and consume 1 every day rather than every few days in order to maintain/gain weight  Maintain CBW +/- 5#, continue to eat meals and snacks consistently and to drink Boost nutritional drinks to supplement food intake in  order to maintain wt       Personal Goal #2 Re-Evaluation   Personal Goal #2  Drink Boost nutritional shakes at least once per  day to help meet calorie and protein needs. You can also look for the Ensure high protein variety  -  -       Personal Goal #3 Re-Evaluation   Personal Goal #3  Continue to include sources of calorie-dense foods such as fats in your diet regularly. Since you like nuts, these would make a great snack  -  -        Nutrition Goals Discharge (Final Nutrition Goals Re-Evaluation): Nutrition Goals Re-Evaluation - 02/22/18 1049      Goals   Nutrition Goal  Eat on a regular schedule and be more consistent about eating an evening snack; drink Boost nutritional drinks at least once per day; continue to include sources of calorie-dense foods such as fats to help maintain weight    Comment  She has been trying to be more consistent about eating snacks between meals. She bought granola to eat and put on yogurt which she has been enjoying. She continues to drink Boost semi-regularly    Expected Outcome  Maintain CBW +/- 5#, continue to eat meals and snacks consistently and to drink Boost nutritional drinks to supplement food intake in order to maintain wt       Psychosocial: Target Goals: Acknowledge presence or absence of significant depression and/or stress, maximize coping skills, provide positive support system. Participant is able to verbalize types and ability to use techniques and skills needed for reducing stress and depression.   Initial Review & Psychosocial Screening: Initial Psych Review & Screening - 12/22/17 1304      Initial Review   Current issues with  Current Stress Concerns    Source of Stress Concerns  Chronic Illness    Comments  Patient requires more oxygen than the last time she was in [place].      Family Dynamics   Good Support System?  Yes    Comments  Her husband is a good support system. Most of her family is in [place].      Barriers    Psychosocial barriers to participate in program  The patient should benefit from training in stress management and relaxation.      Screening Interventions   Interventions  Encouraged to exercise;Program counselor consult;Provide feedback about the scores to participant;To provide support and resources with identified psychosocial needs    Expected Outcomes  Short Term goal: Identification and review with participant of any Quality of Life or Depression concerns found by scoring the questionnaire.;Long Term goal: The participant improves quality of Life and PHQ9 Scores as seen by post scores and/or verbalization of changes;Long Term Goal: Stressors or current issues are controlled or eliminated.;Short Term goal: Utilizing psychosocial counselor, staff and physician to assist with identification of specific Stressors or current issues interfering with healing process. Setting desired goal for each stressor or current issue identified.       Quality of Life Scores:  Scores of 19 and below usually indicate a poorer quality of life in these areas.  A difference of  2-3 points is a clinically meaningful difference.  A difference of 2-3 points in the total score of the Quality of Life Index has been associated with significant improvement in overall quality of life, self-image, physical symptoms, and general health in studies assessing change in quality of life.  PHQ-9: Recent Review Flowsheet Data    Depression screen [facility] 2/9 03/19/2018 03/08/2018 12/22/2017 07/22/2017 06/24/2017   Decreased Interest 0 '1 1 1 1   '$ Down, Depressed, Hopeless '1 1 1 '$ 1  1   PHQ - 2 Score '1 2 2 2 2   '$ Altered sleeping '3 1 1 3 1   '$ Tired, decreased energy '3 1 1 2 2   '$ Change in appetite '2 1 1 1 '$ 0   Feeling bad or failure about yourself  0 0 0 0 0   Trouble concentrating 0 0 0 0 0   Moving slowly or fidgety/restless 0 0 1 0 0   Suicidal thoughts 0 0 0 0 0   PHQ-9 Score '9 5 6 8 5   '$ Difficult doing work/chores Not difficult at all  Somewhat difficult Somewhat difficult - Somewhat difficult     Interpretation of Total Score  Total Score Depression Severity:  1-4 = Minimal depression, 5-9 = Mild depression, 10-14 = Moderate depression, 15-19 = Moderately severe depression, 20-27 = Severe depression   Psychosocial Evaluation and Intervention: Psychosocial Evaluation - 12/28/17 1113      Psychosocial Evaluation & Interventions   Interventions  Relaxation education    Comments  Counselor met with Ms. Brigitte Pulse [facility] for a new intake psychosocial evaluation.  She has returned after several months of being out of the program and completed the CARE program in the interim.  She continues to have a strong support system and no new health diagnoses.  She reports sleeping well and takes natural supplements to help with this.  She states she's a little "dumpy" on occasion meaning sometimes a little "down in the dumps" with her mood but reports she is on medication to help with this, although counselor did not see anything in her medication list for mood.  Latrena states her health is the most stressful thing for her and having to be on oxygen 24/7.  She has goals to improve her breathing and stamina while in this program.      Expected Outcomes  Short:  Shahana will exercise consistently and attend the educational components to learn positive ways to manage her condition.   Long:  Mattalyn will develop relaxation skills to help reduce the amount of oxygen needed.  She will continue to exercise and practice positive self-care.     Continue Psychosocial Services   Follow up required by staff       Psychosocial Re-Evaluation: Psychosocial Re-Evaluation    [place] Name 01/22/18 1054 02/19/18 1031           Psychosocial Re-Evaluation   Current issues with  Current Stress Concerns;Current Sleep Concerns  Current Stress Concerns;Current Sleep Concerns      Comments  Janny has been doing well.  She admits to having up and down.  She is still  getting used to being on her oxygen and the lifestyle change that it has brought on.  She would rather use then not here at all.  She is sitll taking Melatonin at night and it continues to work for her.    Arrabella has been doing well.  She has occasional moments of feeling down especially being on oxygen 24/7. She is coping with her new normal.  She is still taking the melatonin at night and sleeping good.        Expected Outcomes  Short: Continue to practice her self care and come to class.  Long: Continue to stay postive.   Short: Continue to attend rehab regularly.  Long: Continue to cope postively          Psychosocial Discharge (Final Psychosocial Re-Evaluation): Psychosocial Re-Evaluation - 02/19/18 1031      Psychosocial Re-Evaluation  Current issues with  Current Stress Concerns;Current Sleep Concerns    Comments  Norely has been doing well.  She has occasional moments of feeling down especially being on oxygen 24/7. She is coping with her new normal.  She is still taking the melatonin at night and sleeping good.      Expected Outcomes  Short: Continue to attend rehab regularly.  Long: Continue to cope postively        Education: Education Goals: Education classes will be provided on a weekly basis, covering required topics. Participant will state understanding/return demonstration of topics presented.  Learning Barriers/Preferences: Learning Barriers/Preferences - 12/22/17 1228      Learning Barriers/Preferences   Learning Barriers  Sight   wears glasses   Learning Preferences  None       Education Topics:  Initial Evaluation Education: - Verbal, written and demonstration of respiratory meds, oximetry and breathing techniques. Instruction on use of nebulizers and MDIs and importance of monitoring MDI activations.   Pulmonary Rehab from 03/17/2018 in [facility] Cardiac and Pulmonary Rehab  Date  12/22/17  Educator  [facility]  Instruction Review Code  1- Verbalizes Understanding       General Nutrition Guidelines/Fats and Fiber: -Group instruction provided by verbal, written material, models and posters to present the general guidelines for heart healthy nutrition. Gives an explanation and review of dietary fats and fiber.   Pulmonary Rehab from 03/17/2018 in [facility] Cardiac and Pulmonary Rehab  Date  01/27/18  Educator  [facility]  Instruction Review Code  -- [unable to stay due to appointment]      Controlling Sodium/Reading Food Labels: -Group verbal and written material supporting the discussion of sodium use in heart healthy nutrition. Review and explanation with models, verbal and written materials for utilization of the food label.   Pulmonary Rehab from 03/17/2018 in [facility] Cardiac and Pulmonary Rehab  Date  02/03/18  Educator  [facility]  Instruction Review Code  1- Verbalizes Understanding      Exercise Physiology & General Exercise Guidelines: - Group verbal and written instruction with models to review the exercise physiology of the cardiovascular system and associated critical values. Provides general exercise guidelines with specific guidelines to those with heart or lung disease.    Pulmonary Rehab from 03/17/2018 in [facility] Cardiac and Pulmonary Rehab  Date  02/10/18  Educator  [facility]  Instruction Review Code  1- Verbalizes Understanding      Aerobic Exercise & Resistance Training: - Gives group verbal and written instruction on the various components of exercise. Focuses on aerobic and resistive training programs and the benefits of this training and how to safely progress through these programs.   Pulmonary Rehab from 03/17/2018 in [facility] Cardiac and Pulmonary Rehab  Date  02/24/18  Educator  [facility]  Instruction Review Code  1- Verbalizes Understanding      Flexibility, Balance, Mind/Body Relaxation: Provides group verbal/written instruction on the benefits of flexibility and balance training, including mind/body exercise modes such as yoga, pilates and tai chi.   Demonstration and skill practice provided.   Pulmonary Rehab from 03/17/2018 in [facility] Cardiac and Pulmonary Rehab  Date  03/10/18  Educator  [facility]  Instruction Review Code  1- Verbalizes Understanding      Stress and Anxiety: - Provides group verbal and written instruction about the health risks of elevated stress and causes of high stress.  Discuss the correlation between heart/lung disease and anxiety and treatment options. Review healthy ways to manage with stress and anxiety.  Pulmonary Rehab from 07/29/2017 in [facility] Cardiac and Pulmonary Rehab  Date  07/22/17  Educator  [facility]  Instruction Review Code  1- Verbalizes Understanding      Depression: - Provides group verbal and written instruction on the correlation between heart/lung disease and depressed mood, treatment options, and the stigmas associated with seeking treatment.   Pulmonary Rehab from 03/17/2018 in [facility] Cardiac and Pulmonary Rehab  Date  03/03/18  Educator  [facility]  Instruction Review Code  1- Verbalizes Understanding      Exercise & Equipment Safety: - Individual verbal instruction and demonstration of equipment use and safety with use of the equipment.   Pulmonary Rehab from 03/17/2018 in [facility] Cardiac and Pulmonary Rehab  Date  12/22/17  Educator  [facility]  Instruction Review Code  1- Verbalizes Understanding      Infection Prevention: - Provides verbal and written material to individual with discussion of infection control including proper hand washing and proper equipment cleaning during exercise session.   Pulmonary Rehab from 03/17/2018 in [facility] Cardiac and Pulmonary Rehab  Date  12/22/17  Educator  [facility]  Instruction Review Code  1- Verbalizes Understanding      Falls Prevention: - Provides verbal and written material to individual with discussion of falls prevention and safety.   Pulmonary Rehab from 03/17/2018 in [facility] Cardiac and Pulmonary Rehab  Date  12/22/17  Educator  [facility]  Instruction Review Code  1-  Verbalizes Understanding      Diabetes: - Individual verbal and written instruction to review signs/symptoms of diabetes, desired ranges of glucose level fasting, after meals and with exercise. Advice that pre and post exercise glucose checks will be done for 3 sessions at entry of program.   Chronic Lung Diseases: - Group verbal and written instruction to review updates, respiratory medications, advancements in procedures and treatments. Discuss use of supplemental oxygen including available portable oxygen systems, continuous and intermittent flow rates, concentrators, personal use and safety guidelines. Review proper use of inhaler and spacers. Provide informative websites for self-education.    Pulmonary Rehab from 03/17/2018 in [facility] Cardiac and Pulmonary Rehab  Date  01/29/18  Educator  [facility]  Instruction Review Code  1- Verbalizes Understanding      Energy Conservation: - Provide group verbal and written instruction for methods to conserve energy, plan and organize activities. Instruct on pacing techniques, use of adaptive equipment and posture/positioning to relieve shortness of breath.   Pulmonary Rehab from 03/17/2018 in [facility] Cardiac and Pulmonary Rehab  Date  03/17/18  Educator  [facility]  Instruction Review Code  1- Verbalizes Understanding      Triggers and Exacerbations: - Group verbal and written instruction to review types of environmental triggers and ways to prevent exacerbations. Discuss weather changes, air quality and the benefits of nasal washing. Review warning signs and symptoms to help prevent infections. Discuss techniques for effective airway clearance, coughing, and vibrations.   Pulmonary Rehab from 03/17/2018 in [facility] Cardiac and Pulmonary Rehab  Date  02/17/18  Educator  [facility]  Instruction Review Code  1- Verbalizes Understanding      AED/CPR: - Group verbal and written instruction with the use of models to demonstrate the basic use of the AED with the basic ABC's  of resuscitation.   Pulmonary Rehab from 07/29/2017 in [facility] Cardiac and Pulmonary Rehab  Date  07/17/17  Educator  [facility]  Instruction Review Code  1- Actuary and Physiology of the Lungs: - Group verbal and  written instruction with the use of models to provide basic lung anatomy and physiology related to function, structure and complications of lung disease.   Pulmonary Rehab from 07/29/2017 in [facility] Cardiac and Pulmonary Rehab  Date  07/29/17  Educator  [facility]  Instruction Review Code  1- Verbalizes Understanding      Anatomy & Physiology of the Heart: - Group verbal and written instruction and models provide basic cardiac anatomy and physiology, with the coronary electrical and arterial systems. Review of Valvular disease and Heart Failure   Pulmonary Rehab from 03/17/2018 in [facility] Cardiac and Pulmonary Rehab  Date  02/26/18  Educator  [facility]  Instruction Review Code  1- Verbalizes Understanding      Cardiac Medications: - Group verbal and written instruction to review commonly prescribed medications for heart disease. Reviews the medication, class of the drug, and side effects.   Pulmonary Rehab from 03/17/2018 in [facility] Cardiac and Pulmonary Rehab  Date  03/12/18  Educator  [facility]  Instruction Review Code  1- Verbalizes Understanding      Know Your Numbers and Risk Factors: -Group verbal and written instruction about important numbers in your health.  Discussion of what are risk factors and how they play a role in the disease process.  Review of Cholesterol, Blood Pressure, Diabetes, and BMI and the role they play in your overall health.   Pulmonary Rehab from 03/17/2018 in [facility] Cardiac and Pulmonary Rehab  Date  02/12/18  Educator  [facility]  Instruction Review Code  1- Verbalizes Understanding      Sleep Hygiene: -Provides group verbal and written instruction about how sleep can affect your health.  Define sleep hygiene, discuss sleep cycles and impact of sleep  habits. Review good sleep hygiene tips.    Pulmonary Rehab from 03/17/2018 in [facility] Cardiac and Pulmonary Rehab  Date  01/20/18  Educator  [facility]  Instruction Review Code  1- Verbalizes Understanding      Other: -Provides group and verbal instruction on various topics (see comments)   Pulmonary Rehab from 07/29/2017 in [facility] Cardiac and Pulmonary Rehab  Date  06/10/17  Educator  [facility]  Instruction Review Code  1- Verbalizes Understanding [SLEEP]       Knowledge Questionnaire Score: Knowledge Questionnaire Score - 03/08/18 1047      Knowledge Questionnaire Score   Pre Score  16/18    Post Score  17/18   reviewed with patient       Core Components/Risk Factors/Patient Goals at Admission: Personal Goals and Risk Factors at Admission - 12/22/17 1302      Core Components/Risk Factors/Patient Goals on Admission    Weight Management  Weight Maintenance;Yes    Intervention  Weight Management: Develop a combined nutrition and exercise program designed to reach desired caloric intake, while maintaining appropriate intake of nutrient and fiber, sodium and fats, and appropriate energy expenditure required for the weight goal.;Weight Management/Obesity: Establish reasonable short term and long term weight goals.;Weight Management: Provide education and appropriate resources to help participant work on and attain dietary goals.    Admit Weight  99 lb 4.8 oz (45 kg)    Goal Weight: Long Term  100 lb (45.4 kg)    Expected Outcomes  Short Term: Continue to assess and modify interventions until short term weight is achieved;Long Term: Adherence to nutrition and physical activity/exercise program aimed toward attainment of established weight goal;Weight Maintenance: Understanding of the daily nutrition guidelines, which includes 25-35% calories from fat, 7% or less cal from saturated fats,  less than '200mg'$  cholesterol, less than 1.5gm of sodium, & 5 or more servings of fruits and vegetables daily;Understanding  recommendations for meals to include 15-35% energy as protein, 25-35% energy from fat, 35-60% energy from carbohydrates, less than '200mg'$  of dietary cholesterol, 20-35 gm of total fiber daily;Understanding of distribution of calorie intake throughout the day with the consumption of 4-5 meals/snacks    Improve shortness of breath with ADL's  Yes    Intervention  Provide education, individualized exercise plan and daily activity instruction to help decrease symptoms of SOB with activities of daily living.    Expected Outcomes  Short Term: Improve cardiorespiratory fitness to achieve a reduction of symptoms when performing ADLs;Long Term: Be able to perform more ADLs without symptoms or delay the onset of symptoms       Core Components/Risk Factors/Patient Goals Review:  Goals and Risk Factor Review    Row Name 01/22/18 1056 02/19/18 1033           Core Components/Risk Factors/Patient Goals Review   Personal Goals Review  Weight Management/Obesity;Improve shortness of breath with ADL's  Weight Management/Obesity;Improve shortness of breath with ADL's      Review  Ronnae is doing well with her weight.  She is back up to 102lbs.  Her breathing continues to be a little moderate with exercise and she does need to exercise on a higher exercise flow.    She has been holding steady around 102 lbs.  Her breathing is still moderate with activity and finds that weight bearing activities require a higher flow.        Expected Outcomes  Short: Continue to work on weight maintenance.  Long: Continue to work on improving her breathing.   Short: Continue to work on weight maintenance. Long: Continue to work on breathing.          Core Components/Risk Factors/Patient Goals at Discharge (Final Review):  Goals and Risk Factor Review - 02/19/18 1033      Core Components/Risk Factors/Patient Goals Review   Personal Goals Review  Weight Management/Obesity;Improve shortness of breath with ADL's    Review  She has  been holding steady around 102 lbs.  Her breathing is still moderate with activity and finds that weight bearing activities require a higher flow.      Expected Outcomes  Short: Continue to work on weight maintenance. Long: Continue to work on breathing.        ITP Comments: ITP Comments    Row Name 12/22/17 1217 01/11/18 0829 02/08/18 0831 03/08/18 0848 03/22/18 0953   ITP Comments  Medical evaluation completed. Chart sent for review and changes to Dr. Caryl Comes for Dr. Emily Filbert director of [place]. Diagnosis can be found in [facility] encounter 12/14/17  30 day review completed. ITP sent to Dr. Emily Filbert Director of [place]. Continue with ITP unless changes are made by physician  30 day review completed. ITP sent to Dr. Emily Filbert Director of [place]. Continue with ITP unless changes are made by physician  30 day review completed. ITP sent to Dr. Emily Filbert Director of [place]. Continue with ITP unless changes are made by physician.  Discharge ITP sent and signed by Dr. Sabra Heck.  Discharge Summary routed to PCP and pulmonologist.      Comments: Discharge ITP

## 2018-03-22 NOTE — Progress Notes (Signed)
Daily Session Note  Patient Details  Name: Sherri Price MRN: [id number] Date of Birth: [birth year] Referring Provider:     Pulmonary Rehab from 12/22/2017 in [facility] Cardiac and Pulmonary Rehab  Referring Provider  Ramonita Lab MD      Encounter Date: 03/22/2018  Check In: Session Check In - 03/22/18 0949      Check-In   Supervising physician immediately available to respond to emergencies  [facility] immediately available ER MD    Physician(s)  Dr. Corky Downs and Joni Fears    Location  [facility]-Cardiac & Pulmonary Rehab    Staff Present  Earlean Shawl, BS, ACSM CEP, Exercise Physiologist;Shaughn Thomley Tessie Fass RCP,RRT,BSRT    Medication changes reported      No    Fall or balance concerns reported     No    Warm-up and Cool-down  Performed as group-led instruction    Resistance Training Performed  Yes    VAD Patient?  No    PAD/SET Patient?  No      Pain Assessment   Currently in Pain?  No/denies          Social History   Tobacco Use  Smoking Status Never Smoker  Smokeless Tobacco Never Used    Goals Met:  Proper associated with RPD/PD & O2 Sat Independence with exercise equipment Improved SOB with ADL's Exercise tolerated well No report of cardiac concerns or symptoms Strength training completed today  Goals Unmet:  Not Applicable  Comments:  Kinzleigh graduated today from  rehab with 36 sessions completed.  Details of the patient's exercise prescription and what She needs to do in order to continue the prescription and progress were discussed with patient.  Patient was given a copy of prescription and goals.  Patient verbalized understanding.  Oleva plans to continue to exercise by joining [employer].   Dr. Emily Filbert is Medical Director for [place] and [facility].

## 2018-03-22 NOTE — Patient Instructions (Signed)
Discharge Patient Instructions  Patient Details  Name: Sherri Price MRN: [id number] Date of Birth: Apr 14, 1934 Referring Provider:  Adin Hector, MD   Number of Visits: 36  Reason for Discharge:  Patient reached a stable level of exercise. Patient independent in their exercise. Patient has met program and personal goals.  Smoking History:  Social History   Tobacco Use  Smoking Status Never Smoker  Smokeless Tobacco Never Used    Diagnosis:  Pulmonary hypertension ([place])  Initial Exercise Prescription: Initial Exercise Prescription - 12/22/17 1400      Date of Initial Exercise RX and Referring Provider   Date  12/22/17    Referring Provider  Ramonita Lab MD      Oxygen   Oxygen  Continuous    Liters  4-6      Treadmill   MPH  1.2    Grade  0.5    Minutes  15    METs  2      NuStep   Level  4    SPM  80    Minutes  15    METs  2      Biostep-RELP   Level  4    SPM  50    Minutes  15    METs  2      Prescription Details   Frequency (times per week)  3    Duration  Progress to 45 minutes of aerobic exercise without signs/symptoms of physical distress      Intensity   THRR 40-80% of Max Heartrate  96-125    Ratings of Perceived Exertion  11-13    Perceived Dyspnea  0-4      Progression   Progression  Continue to progress workloads to maintain intensity without signs/symptoms of physical distress.      Resistance Training   Training Prescription  Yes    Weight  2 lbs    Reps  10-15       Discharge Exercise Prescription (Final Exercise Prescription Changes): Exercise Prescription Changes - 03/10/18 1500      Response to Exercise   Blood Pressure (Admit)  126/64    Blood Pressure (Exit)  118/56    Heart Rate (Admit)  87 bpm    Heart Rate (Exercise)  93 bpm    Heart Rate (Exit)  73 bpm    Oxygen Saturation (Admit)  9 %    Oxygen Saturation (Exercise)  73 %   stopped walk test   Oxygen Saturation (Exit)  92 %    Rating of Perceived  Exertion (Exercise)  13    Perceived Dyspnea (Exercise)  3    Duration  Continue with 45 min of aerobic exercise without signs/symptoms of physical distress.    Intensity  THRR unchanged      Progression   Progression  Continue to progress workloads to maintain intensity without signs/symptoms of physical distress.    Average METs  2      Resistance Training   Training Prescription  Yes    Weight  2 lb    Reps  10-15      Interval Training   Interval Training  No      Oxygen   Oxygen  Continuous    Liters  6      Treadmill   MPH  1.2    Grade  0.5    Minutes  15    METs  2       Functional Capacity:  [place] Name 12/22/17 1425 03/10/18 1029 03/17/18 1041     6 Minute Walk   Phase  Initial  Discharge  Discharge   Distance  688 feet  365 feet  750 feet   Distance % Change  -  -46.9 %  9 %   Distance Feet Change  -  -323 ft  62 ft   Walk Time  6 minutes  3 minutes  6 minutes   # of Rest Breaks  0  0 Test stopped at 3 min for desaturation  0   MPH  1.3  1.38  1.42   METS  1.29  0.89  1.5   RPE  _0 Perceived Dyspnea   _1 VO2 Peak  4.51  3.13  5.26   Symptoms  Yes (comment)  No  Yes (comment)   Comments  SOB  SOB  SOB   Resting HR  68 bpm  87 bpm  90 bpm   Resting BP  124/62  126/64  116/60   Resting Oxygen Saturation   92 %  92 %  93 %   Exercise Oxygen Saturation  during 6 min walk  83 %  73 %  83 %   Max Ex. HR  89 bpm  99 bpm  93 bpm   Max Ex. BP  128/70  144/74  152/78   2 Minute Post BP  122/66  144/70  146/70     Interval HR   1 Minute HR  80  94  79   2 Minute HR  82  99  85   3 Minute HR  83  93  83   4 Minute HR  82  -  93   5 Minute HR  81  -  93   6 Minute HR  89  -  83   2 Minute Post HR  69  -  72   Interval Heart Rate?  Yes  Yes  Yes     Interval Oxygen   Interval Oxygen?  Yes  Yes  Yes   Baseline Oxygen Saturation %  92 %  92 %  93 %   1 Minute Oxygen Saturation %  88 %  86 %  95 %   1 Minute Liters of Oxygen   4 L  4 L continuous (small e-cylinder)  6 L CONTINUOS E CYLINDER   2 Minute Oxygen Saturation %  86 % 2:26 85%  80 %  93 %   2 Minute Liters of Oxygen  4 L  4 L  6 L   3 Minute Oxygen Saturation %  85 %  73 %  93 %   3 Minute Liters of Oxygen  4 L  4 L  6 L   4 Minute Oxygen Saturation %  84 % 4:20 83%  -  87 %   4 Minute Liters of Oxygen  4 L  -  6 L   5 Minute Oxygen Saturation %  86 %  -  86 %   5 Minute Liters of Oxygen  4 L  -  6 L   6 Minute Oxygen Saturation %  85 %  -  83 %   6 Minute Liters of Oxygen  4 L  -  6 L   2 Minute Post Oxygen Saturation %  90 %  -  94 %  2 Minute Post Liters of Oxygen  4 L  -  6 L      Quality of Life:   Personal Goals: Goals established at orientation with interventions provided to work toward goal. Personal Goals and Risk Factors at Admission - 12/22/17 1302      Core Components/Risk Factors/Patient Goals on Admission    Weight Management  Weight Maintenance;Yes    Intervention  Weight Management: Develop a combined nutrition and exercise program designed to reach desired caloric intake, while maintaining appropriate intake of nutrient and fiber, sodium and fats, and appropriate energy expenditure required for the weight goal.;Weight Management/Obesity: Establish reasonable short term and long term weight goals.;Weight Management: Provide education and appropriate resources to help participant work on and attain dietary goals.    Admit Weight  99 lb 4.8 oz (45 kg)    Goal Weight: Long Term  100 lb (45.4 kg)    Expected Outcomes  Short Term: Continue to assess and modify interventions until short term weight is achieved;Long Term: Adherence to nutrition and physical activity/exercise program aimed toward attainment of established weight goal;Weight Maintenance: Understanding of the daily nutrition guidelines, which includes 25-35% calories from fat, 7% or less cal from saturated fats, less than 237m cholesterol, less than 1.5gm of sodium, & 5 or more  servings of fruits and vegetables daily;Understanding recommendations for meals to include 15-35% energy as protein, 25-35% energy from fat, 35-60% energy from carbohydrates, less than 2061mof dietary cholesterol, 20-35 gm of total fiber daily;Understanding of distribution of calorie intake throughout the day with the consumption of 4-5 meals/snacks    Improve shortness of breath with ADL's  Yes    Intervention  Provide education, individualized exercise plan and daily activity instruction to help decrease symptoms of SOB with activities of daily living.    Expected Outcomes  Short Term: Improve cardiorespiratory fitness to achieve a reduction of symptoms when performing ADLs;Long Term: Be able to perform more ADLs without symptoms or delay the onset of symptoms        Personal Goals Discharge: Goals and Risk Factor Review - 02/19/18 1033      Core Components/Risk Factors/Patient Goals Review   Personal Goals Review  Weight Management/Obesity;Improve shortness of breath with ADL's    Review  She has been holding steady around 102 lbs.  Her breathing is still moderate with activity and finds that weight bearing activities require a higher flow.      Expected Outcomes  Short: Continue to work on weight maintenance. Long: Continue to work on breathing.        Exercise Goals and Review: Exercise Goals    Row Name 12/22/17 1433             Exercise Goals   Increase Physical Activity  Yes       Intervention  Provide advice, education, support and counseling about physical activity/exercise needs.;Develop an individualized exercise prescription for aerobic and resistive training based on initial evaluation findings, risk stratification, comorbidities and participant's personal goals.       Expected Outcomes  Short Term: Attend rehab on a regular basis to increase amount of physical activity.;Long Term: Add in home exercise to make exercise part of routine and to increase amount of physical  activity.;Long Term: Exercising regularly at least 3-5 days a week.       Increase Strength and Stamina  Yes       Intervention  Provide advice, education, support and counseling about physical activity/exercise needs.;Develop an individualized  exercise prescription for aerobic and resistive training based on initial evaluation findings, risk stratification, comorbidities and participant's personal goals.       Expected Outcomes  Short Term: Increase workloads from initial exercise prescription for resistance, speed, and METs.;Short Term: Perform resistance training exercises routinely during rehab and add in resistance training at home;Long Term: Improve cardiorespiratory fitness, muscular endurance and strength as measured by increased METs and functional capacity (6MWT)       Able to understand and use rate of perceived exertion (RPE) scale  Yes       Intervention  Provide education and explanation on how to use RPE scale       Expected Outcomes  Long Term:  Able to use RPE to guide intensity level when exercising independently;Short Term: Able to use RPE daily in rehab to express subjective intensity level       Able to understand and use Dyspnea scale  Yes       Intervention  Provide education and explanation on how to use Dyspnea scale       Expected Outcomes  Short Term: Able to use Dyspnea scale daily in rehab to express subjective sense of shortness of breath during exertion;Long Term: Able to use Dyspnea scale to guide intensity level when exercising independently       Knowledge and understanding of Target Heart Rate Range (THRR)  Yes       Intervention  Provide education and explanation of THRR including how the numbers were predicted and where they are located for reference       Expected Outcomes  Short Term: Able to state/look up THRR;Long Term: Able to use THRR to govern intensity when exercising independently;Short Term: Able to use daily as guideline for intensity in rehab       Able  to check pulse independently  Yes       Intervention  Provide education and demonstration on how to check pulse in carotid and radial arteries.;Review the importance of being able to check your own pulse for safety during independent exercise       Expected Outcomes  Short Term: Able to explain why pulse checking is important during independent exercise;Long Term: Able to check pulse independently and accurately       Understanding of Exercise Prescription  Yes       Intervention  Provide education, explanation, and written materials on patient's individual exercise prescription       Expected Outcomes  Short Term: Able to explain program exercise prescription;Long Term: Able to explain home exercise prescription to exercise independently          Nutrition & Weight - Outcomes: Pre Biometrics - 12/22/17 1434      Pre Biometrics   Height  4' 11.2" (1.504 m)    Weight  99 lb 4.8 oz (45 kg)    Waist Circumference  27 inches    Hip Circumference  35.5 inches    Waist to Hip Ratio  0.76 %    BMI (Calculated)  19.91    Single Leg Stand  2.43 seconds      Post Biometrics - 03/10/18 1618       Post  Biometrics   Height  4' 11.2" (1.504 m)    Weight  103 lb 14.4 oz (47.1 kg)    Waist Circumference  28.25 inches    Hip Circumference  35 inches    Waist to Hip Ratio  0.81 %    BMI (Calculated)  20.83  Single Leg Stand  4.15 seconds       Nutrition: Nutrition Therapy & Goals - 12/28/17 1037      Nutrition Therapy   Diet  TLC    Protein (specify units)  6oz    Fiber  20 grams    Whole Grain Foods  3 servings    Saturated Fats  12 max. grams    Fruits and Vegetables  5 servings/day   8 ideal, eats small portions   Sodium  1500 grams      Personal Nutrition Goals   Nutrition Goal  Drink Boost nutritional shakes at least once per day to help meet calorie and protein needs. You can also look for the Ensure high protein variety    Personal Goal #2  Be consistent about consuming an  evening snack, along with regular meals/ snacks throughout the day. Eating on a consistent schedule will help prevent wt loss    Personal Goal #3  Continue to include sources of calorie-dense foods such as fats in your diet regularly. Since you like nuts, these would make a great snack    Comments  She tries to eat as much as she can at meal times but still struggles to meet daily energy needs. She has been eating dried fruit, nuts, and some protein shakes/ snacks      Intervention Plan   Intervention  Prescribe, educate and counsel regarding individualized specific dietary modifications aiming towards targeted core components such as weight, hypertension, lipid management, diabetes, heart failure and other comorbidities.    Expected Outcomes  Short Term Goal: Understand basic principles of dietary content, such as calories, fat, sodium, cholesterol and nutrients.;Short Term Goal: A plan has been developed with personal nutrition goals set during dietitian appointment.;Long Term Goal: Adherence to prescribed nutrition plan.       Nutrition Discharge: Nutrition Assessments - 03/08/18 1047      MEDFICTS Scores   Pre Score  13    Post Score  15    Score Difference  2       Education Questionnaire Score: Knowledge Questionnaire Score - 03/08/18 1047      Knowledge Questionnaire Score   Pre Score  16/18    Post Score  17/18   reviewed with patient      Goals reviewed with patient; copy given to patient.

## 2018-06-07 ENCOUNTER — Other Ambulatory Visit: Payer: Self-pay | Admitting: Internal Medicine

## 2018-06-07 DIAGNOSIS — Z1231 Encounter for screening mammogram for malignant neoplasm of breast: Secondary | ICD-10-CM

## 2018-07-20 ENCOUNTER — Ambulatory Visit
Admission: RE | Admit: 2018-07-20 | Discharge: 2018-07-20 | Disposition: A | Discharge status: Home / Self Care | Payer: Medicare Other | Source: Ambulatory Visit | Attending: Internal Medicine | Admitting: Internal Medicine

## 2018-07-20 DIAGNOSIS — Z1231 Encounter for screening mammogram for malignant neoplasm of breast: Secondary | ICD-10-CM | POA: Diagnosis present

## 2019-01-18 DEATH — deceased

## 2020-02-07 IMAGING — US US CAROTID DUPLEX BILAT
1 series · 13 of 24 positions shown · non-contrast
Comparison: None.

CLINICAL DATA: Postural dizziness

EXAM:
BILATERAL CAROTID DUPLEX ULTRASOUND
TECHNIQUE: Gray scale imaging, color Doppler and duplex ultrasound was
performed of bilateral carotid and vertebral arteries in the neck.
TECHNIQUE: Quantification of carotid stenosis is based on velocity parameters
that correlate the residual internal carotid diameter with
NASCET-based stenosis levels, using the diameter of the distal
internal carotid lumen as the denominator for stenosis measurement.

[Series 1: us carotid duplex bilat · 13 of 66 slices shown]
[im 1/66]
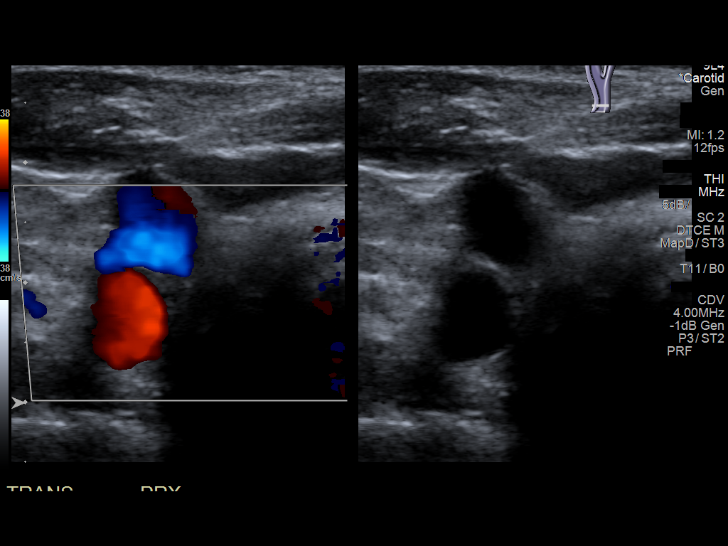
[im 6/66]
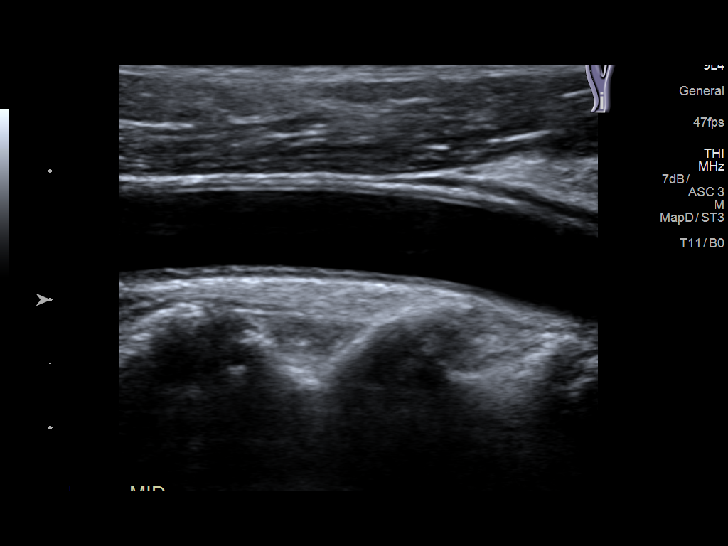
[im 12/66]
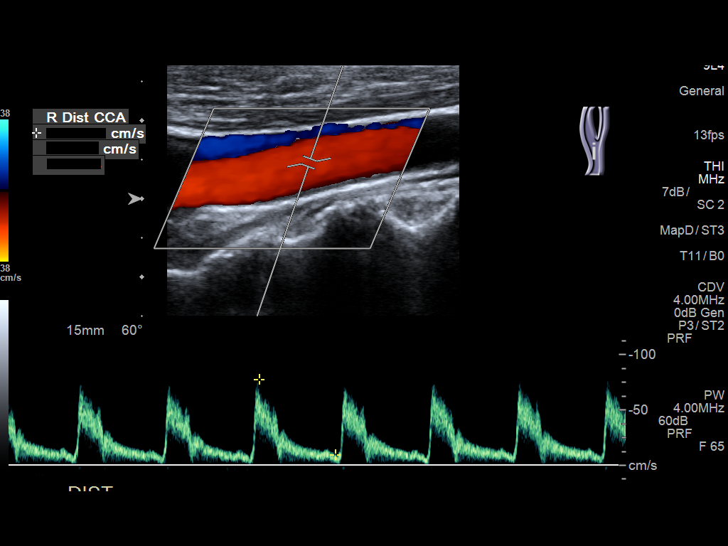
[im 17/66]
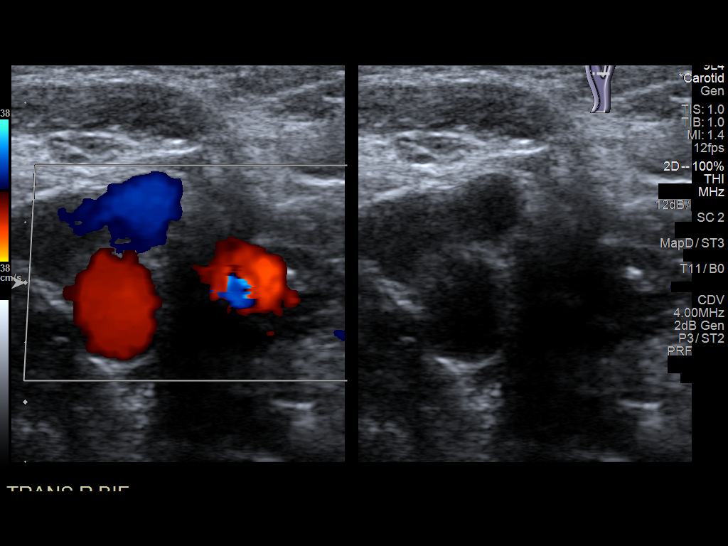
[im 23/66]
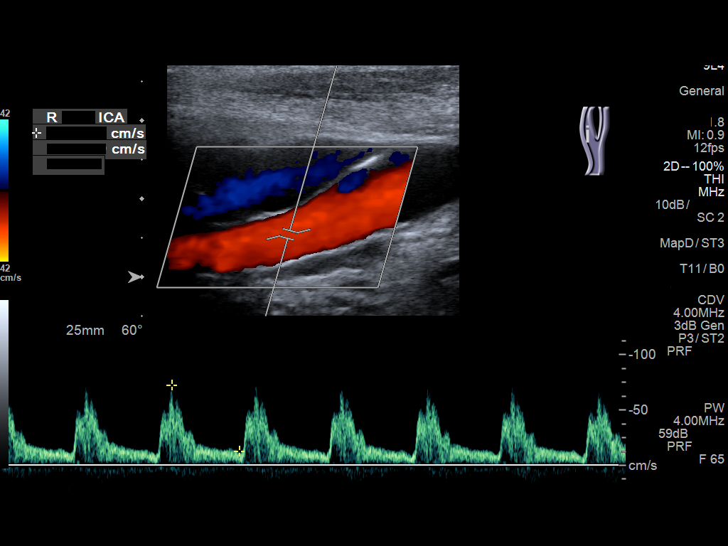
[im 29/66]
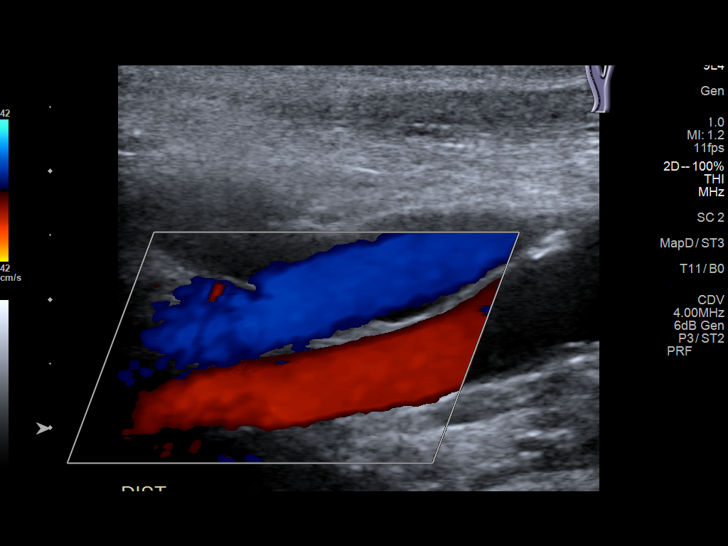
[im 34/66]
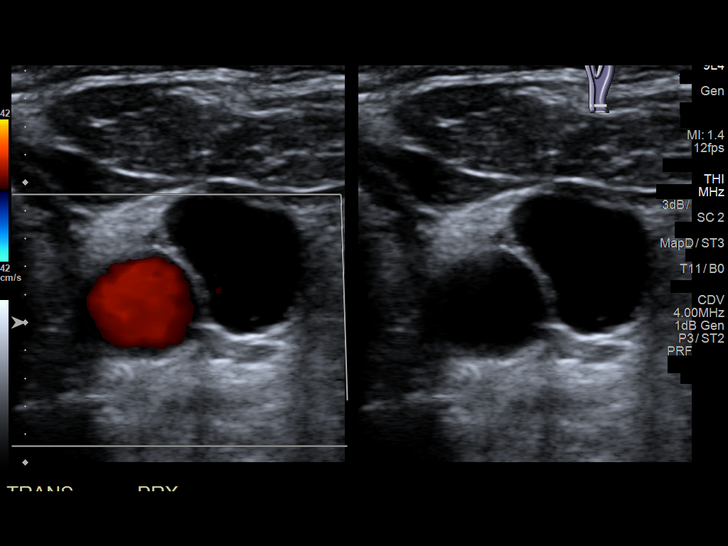
[im 37/66]
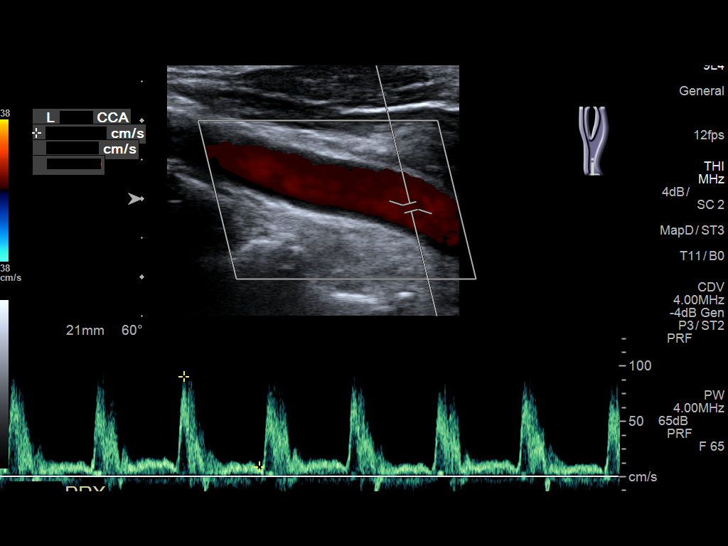
[im 43/66]
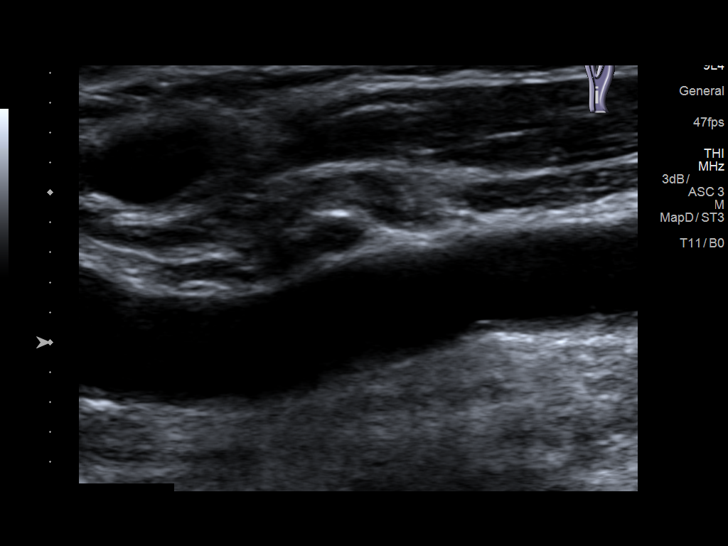
[im 49/66]
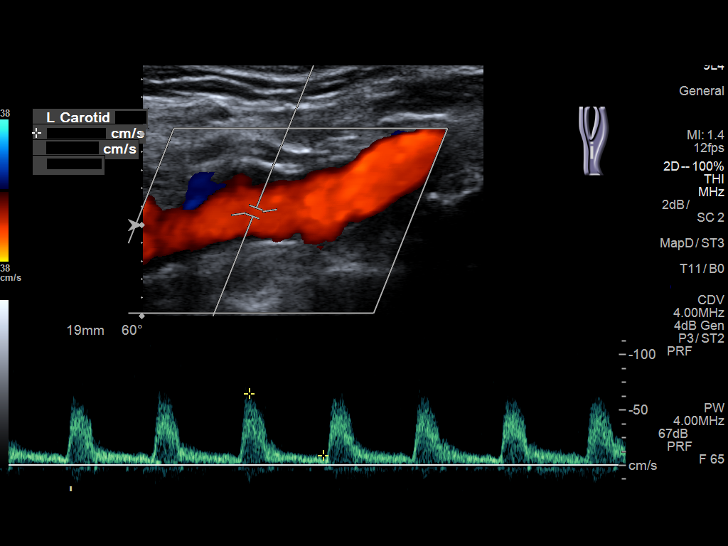
[im 54/66]
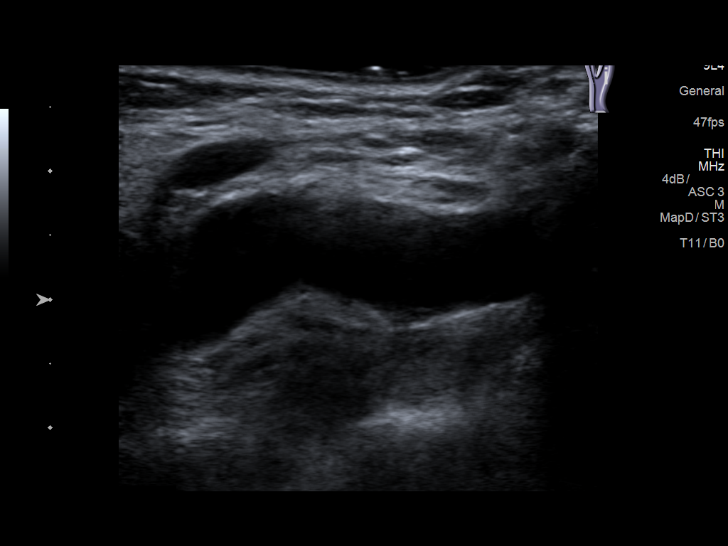
[im 60/66]
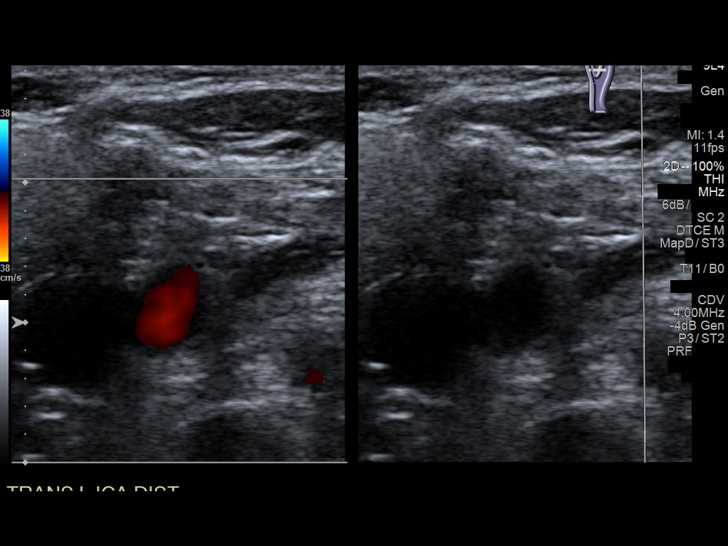
[im 66/66]
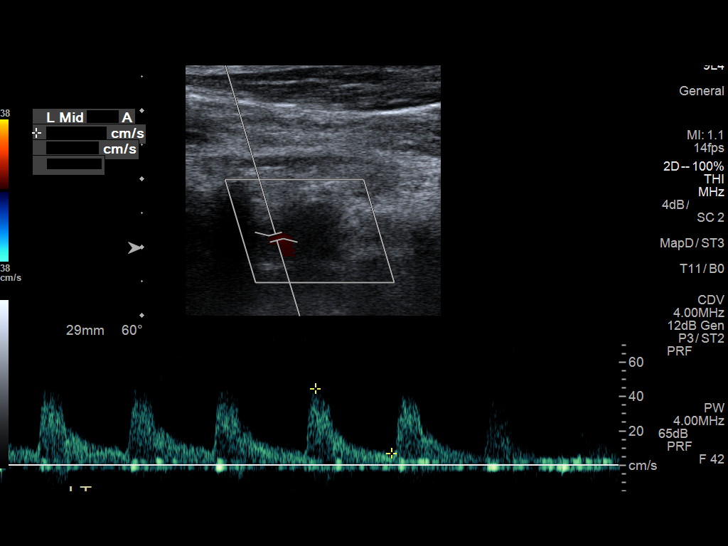

[13 of 24 positions shown; findings below may reference images not displayed]

The following velocity measurements were obtained:

PEAK SYSTOLIC/END DIASTOLIC

RIGHT

ICA:                     73/13cm/sec

CCA:                     78/10cm/sec

SYSTOLIC ICA/CCA RATIO:

ECA:                     63cm/sec

LEFT

ICA:                     76/21cm/sec

CCA:                     84/10cm/sec

SYSTOLIC ICA/CCA RATIO:

ECA:                     69cm/sec
FINDINGS: RIGHT CAROTID ARTERY: Mild eccentric partially calcified plaque in
the bulb and proximal right ICA. No significant stenosis. Normal
waveforms and color Doppler signal.

RIGHT VERTEBRAL ARTERY:  Normal flow direction and waveform.

LEFT CAROTID ARTERY: No significant plaque or stenosis. Mild
tortuosity. Normal waveforms and color Doppler signal.

LEFT VERTEBRAL ARTERY: Normal flow direction and waveform.
IMPRESSION: 1. Mild right carotid bifurcation plaque resulting in less than 50%
diameter ICA stenosis.
2.  Antegrade bilateral vertebral arterial flow.
